# Patient Record
Sex: Female | Born: 1935 | Race: White | Hispanic: No | Smoking: Former smoker
Health system: Southern US, Community
[De-identification: ages and names within clinical notes are randomized; demographics above are authoritative.]

## PROBLEM LIST (undated history)

## (undated) DIAGNOSIS — I10 Essential (primary) hypertension: Secondary | ICD-10-CM

## (undated) DIAGNOSIS — G4700 Insomnia, unspecified: Secondary | ICD-10-CM

## (undated) DIAGNOSIS — I739 Peripheral vascular disease, unspecified: Secondary | ICD-10-CM

## (undated) DIAGNOSIS — E871 Hypo-osmolality and hyponatremia: Secondary | ICD-10-CM

## (undated) DIAGNOSIS — Z923 Personal history of irradiation: Secondary | ICD-10-CM

## (undated) DIAGNOSIS — E063 Autoimmune thyroiditis: Secondary | ICD-10-CM

## (undated) DIAGNOSIS — R350 Frequency of micturition: Secondary | ICD-10-CM

## (undated) DIAGNOSIS — I2781 Cor pulmonale (chronic): Secondary | ICD-10-CM

## (undated) DIAGNOSIS — J309 Allergic rhinitis, unspecified: Secondary | ICD-10-CM

## (undated) DIAGNOSIS — J449 Chronic obstructive pulmonary disease, unspecified: Secondary | ICD-10-CM

## (undated) DIAGNOSIS — R918 Other nonspecific abnormal finding of lung field: Secondary | ICD-10-CM

## (undated) DIAGNOSIS — L2089 Other atopic dermatitis: Secondary | ICD-10-CM

## (undated) DIAGNOSIS — R259 Unspecified abnormal involuntary movements: Secondary | ICD-10-CM

## (undated) DIAGNOSIS — F329 Major depressive disorder, single episode, unspecified: Secondary | ICD-10-CM

## (undated) DIAGNOSIS — E119 Type 2 diabetes mellitus without complications: Secondary | ICD-10-CM

## (undated) DIAGNOSIS — F32A Depression, unspecified: Secondary | ICD-10-CM

## (undated) DIAGNOSIS — E785 Hyperlipidemia, unspecified: Secondary | ICD-10-CM

## (undated) HISTORY — DX: Cor pulmonale (chronic): I27.81

## (undated) HISTORY — DX: Other nonspecific abnormal finding of lung field: R91.8

## (undated) HISTORY — DX: Depression, unspecified: F32.A

## (undated) HISTORY — PX: ROTATOR CUFF REPAIR: SHX139

## (undated) HISTORY — DX: Autoimmune thyroiditis: E06.3

## (undated) HISTORY — DX: Allergic rhinitis, unspecified: J30.9

## (undated) HISTORY — DX: Unspecified abnormal involuntary movements: R25.9

## (undated) HISTORY — DX: Chronic obstructive pulmonary disease, unspecified: J44.9

## (undated) HISTORY — PX: OTHER SURGICAL HISTORY: SHX169

## (undated) HISTORY — DX: Other atopic dermatitis: L20.89

## (undated) HISTORY — DX: Hypo-osmolality and hyponatremia: E87.1

## (undated) HISTORY — DX: Peripheral vascular disease, unspecified: I73.9

## (undated) HISTORY — DX: Major depressive disorder, single episode, unspecified: F32.9

## (undated) HISTORY — DX: Type 2 diabetes mellitus without complications: E11.9

## (undated) HISTORY — DX: Hyperlipidemia, unspecified: E78.5

## (undated) HISTORY — DX: Essential (primary) hypertension: I10

## (undated) HISTORY — PX: TOTAL ABDOMINAL HYSTERECTOMY: SHX209

## (undated) HISTORY — DX: Personal history of irradiation: Z92.3

## (undated) HISTORY — DX: Frequency of micturition: R35.0

## (undated) HISTORY — PX: EYE SURGERY: SHX253

## (undated) HISTORY — DX: Insomnia, unspecified: G47.00

---
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
## 1999-05-29 ENCOUNTER — Encounter: Payer: Self-pay | Admitting: Emergency Medicine

## 1999-05-29 ENCOUNTER — Ambulatory Visit (HOSPITAL_COMMUNITY): Admission: RE | Admit: 1999-05-29 | Discharge: 1999-05-29 | Payer: Self-pay | Admitting: Emergency Medicine

## 1999-10-16 ENCOUNTER — Encounter: Admission: RE | Admit: 1999-10-16 | Discharge: 1999-10-16 | Payer: Self-pay | Admitting: General Surgery

## 1999-10-16 ENCOUNTER — Encounter (HOSPITAL_BASED_OUTPATIENT_CLINIC_OR_DEPARTMENT_OTHER): Payer: Self-pay | Admitting: General Surgery

## 1999-10-18 ENCOUNTER — Ambulatory Visit (HOSPITAL_BASED_OUTPATIENT_CLINIC_OR_DEPARTMENT_OTHER): Admission: RE | Admit: 1999-10-18 | Discharge: 1999-10-18 | Payer: Self-pay | Admitting: General Surgery

## 2000-03-03 ENCOUNTER — Encounter: Admission: RE | Admit: 2000-03-03 | Discharge: 2000-03-03 | Payer: Self-pay | Admitting: Emergency Medicine

## 2000-03-03 ENCOUNTER — Encounter: Payer: Self-pay | Admitting: Emergency Medicine

## 2000-06-19 ENCOUNTER — Ambulatory Visit (HOSPITAL_COMMUNITY): Admission: RE | Admit: 2000-06-19 | Discharge: 2000-06-19 | Payer: Self-pay | Admitting: Emergency Medicine

## 2000-08-13 ENCOUNTER — Encounter (HOSPITAL_COMMUNITY): Admission: RE | Admit: 2000-08-13 | Discharge: 2000-11-11 | Payer: Self-pay | Admitting: Internal Medicine

## 2000-11-12 ENCOUNTER — Encounter (HOSPITAL_COMMUNITY): Admission: RE | Admit: 2000-11-12 | Discharge: 2001-01-28 | Payer: Self-pay | Admitting: Internal Medicine

## 2001-03-04 ENCOUNTER — Encounter: Admission: RE | Admit: 2001-03-04 | Discharge: 2001-03-04 | Payer: Self-pay | Admitting: Emergency Medicine

## 2001-03-04 ENCOUNTER — Encounter: Payer: Self-pay | Admitting: Emergency Medicine

## 2001-04-26 ENCOUNTER — Ambulatory Visit (HOSPITAL_COMMUNITY): Admission: RE | Admit: 2001-04-26 | Discharge: 2001-04-26 | Payer: Self-pay | Admitting: Gastroenterology

## 2001-08-26 ENCOUNTER — Inpatient Hospital Stay (HOSPITAL_COMMUNITY): Admission: EM | Admit: 2001-08-26 | Discharge: 2001-08-30 | Payer: Self-pay | Admitting: Emergency Medicine

## 2001-11-09 ENCOUNTER — Encounter: Admission: RE | Admit: 2001-11-09 | Discharge: 2001-11-09 | Payer: Self-pay | Admitting: Emergency Medicine

## 2001-11-09 ENCOUNTER — Encounter: Payer: Self-pay | Admitting: Emergency Medicine

## 2002-02-28 ENCOUNTER — Encounter: Admission: RE | Admit: 2002-02-28 | Discharge: 2002-02-28 | Payer: Self-pay | Admitting: Emergency Medicine

## 2002-02-28 ENCOUNTER — Encounter: Payer: Self-pay | Admitting: Emergency Medicine

## 2002-03-11 ENCOUNTER — Encounter: Admission: RE | Admit: 2002-03-11 | Discharge: 2002-03-11 | Payer: Self-pay | Admitting: Orthopedic Surgery

## 2002-03-11 ENCOUNTER — Encounter: Payer: Self-pay | Admitting: Orthopedic Surgery

## 2002-03-14 ENCOUNTER — Ambulatory Visit (HOSPITAL_BASED_OUTPATIENT_CLINIC_OR_DEPARTMENT_OTHER): Admission: RE | Admit: 2002-03-14 | Discharge: 2002-03-14 | Payer: Self-pay | Admitting: Orthopedic Surgery

## 2002-12-29 LAB — HM COLONOSCOPY

## 2003-03-06 ENCOUNTER — Encounter: Payer: Self-pay | Admitting: Emergency Medicine

## 2003-03-06 ENCOUNTER — Encounter: Admission: RE | Admit: 2003-03-06 | Discharge: 2003-03-06 | Payer: Self-pay | Admitting: Emergency Medicine

## 2003-08-01 ENCOUNTER — Encounter: Admission: RE | Admit: 2003-08-01 | Discharge: 2003-08-01 | Payer: Self-pay | Admitting: Emergency Medicine

## 2003-08-01 ENCOUNTER — Encounter: Payer: Self-pay | Admitting: Emergency Medicine

## 2003-09-20 ENCOUNTER — Encounter: Payer: Self-pay | Admitting: Emergency Medicine

## 2003-09-20 ENCOUNTER — Encounter: Admission: RE | Admit: 2003-09-20 | Discharge: 2003-09-20 | Payer: Self-pay | Admitting: Emergency Medicine

## 2003-09-26 ENCOUNTER — Encounter: Payer: Self-pay | Admitting: Emergency Medicine

## 2003-09-26 ENCOUNTER — Encounter: Admission: RE | Admit: 2003-09-26 | Discharge: 2003-09-26 | Payer: Self-pay | Admitting: Emergency Medicine

## 2003-10-10 ENCOUNTER — Ambulatory Visit (HOSPITAL_COMMUNITY): Admission: RE | Admit: 2003-10-10 | Discharge: 2003-10-10 | Payer: Self-pay | Admitting: Emergency Medicine

## 2003-10-10 ENCOUNTER — Encounter: Payer: Self-pay | Admitting: Emergency Medicine

## 2004-03-04 ENCOUNTER — Encounter: Admission: RE | Admit: 2004-03-04 | Discharge: 2004-03-04 | Payer: Self-pay | Admitting: Emergency Medicine

## 2004-03-15 ENCOUNTER — Encounter: Admission: RE | Admit: 2004-03-15 | Discharge: 2004-03-15 | Payer: Self-pay | Admitting: Emergency Medicine

## 2005-03-21 ENCOUNTER — Encounter: Admission: RE | Admit: 2005-03-21 | Discharge: 2005-03-21 | Payer: Self-pay | Admitting: Emergency Medicine

## 2005-03-31 ENCOUNTER — Ambulatory Visit (HOSPITAL_COMMUNITY): Admission: RE | Admit: 2005-03-31 | Discharge: 2005-03-31 | Payer: Self-pay | Admitting: Emergency Medicine

## 2005-05-15 ENCOUNTER — Ambulatory Visit: Payer: Self-pay | Admitting: Internal Medicine

## 2005-09-08 ENCOUNTER — Ambulatory Visit: Payer: Self-pay | Admitting: Internal Medicine

## 2006-03-09 ENCOUNTER — Ambulatory Visit: Payer: Self-pay | Admitting: Internal Medicine

## 2006-03-23 ENCOUNTER — Encounter: Admission: RE | Admit: 2006-03-23 | Discharge: 2006-03-23 | Payer: Self-pay | Admitting: Emergency Medicine

## 2006-12-28 ENCOUNTER — Encounter: Admission: RE | Admit: 2006-12-28 | Discharge: 2006-12-28 | Payer: Self-pay | Admitting: *Deleted

## 2007-03-09 ENCOUNTER — Ambulatory Visit: Payer: Self-pay | Admitting: Internal Medicine

## 2007-03-25 ENCOUNTER — Encounter: Admission: RE | Admit: 2007-03-25 | Discharge: 2007-03-25 | Payer: Self-pay | Admitting: *Deleted

## 2008-01-04 ENCOUNTER — Ambulatory Visit: Payer: Self-pay | Admitting: Vascular Surgery

## 2008-02-01 ENCOUNTER — Telehealth (INDEPENDENT_AMBULATORY_CARE_PROVIDER_SITE_OTHER): Payer: Self-pay | Admitting: *Deleted

## 2008-02-02 DIAGNOSIS — J449 Chronic obstructive pulmonary disease, unspecified: Secondary | ICD-10-CM

## 2008-02-02 DIAGNOSIS — J4489 Other specified chronic obstructive pulmonary disease: Secondary | ICD-10-CM | POA: Insufficient documentation

## 2008-02-02 DIAGNOSIS — I10 Essential (primary) hypertension: Secondary | ICD-10-CM | POA: Insufficient documentation

## 2008-03-14 ENCOUNTER — Ambulatory Visit: Payer: Self-pay | Admitting: Internal Medicine

## 2008-03-14 DIAGNOSIS — J45909 Unspecified asthma, uncomplicated: Secondary | ICD-10-CM | POA: Insufficient documentation

## 2008-03-14 DIAGNOSIS — J309 Allergic rhinitis, unspecified: Secondary | ICD-10-CM | POA: Insufficient documentation

## 2008-03-14 DIAGNOSIS — J439 Emphysema, unspecified: Secondary | ICD-10-CM

## 2008-03-27 ENCOUNTER — Encounter: Admission: RE | Admit: 2008-03-27 | Discharge: 2008-03-27 | Payer: Self-pay | Admitting: *Deleted

## 2008-04-20 ENCOUNTER — Telehealth: Payer: Self-pay | Admitting: Internal Medicine

## 2008-05-09 ENCOUNTER — Encounter: Payer: Self-pay | Admitting: Internal Medicine

## 2008-09-22 ENCOUNTER — Telehealth (INDEPENDENT_AMBULATORY_CARE_PROVIDER_SITE_OTHER): Payer: Self-pay | Admitting: *Deleted

## 2009-03-14 ENCOUNTER — Ambulatory Visit: Payer: Self-pay | Admitting: Internal Medicine

## 2009-03-28 ENCOUNTER — Encounter: Admission: RE | Admit: 2009-03-28 | Discharge: 2009-03-28 | Payer: Self-pay | Admitting: *Deleted

## 2009-04-10 ENCOUNTER — Telehealth (INDEPENDENT_AMBULATORY_CARE_PROVIDER_SITE_OTHER): Payer: Self-pay | Admitting: *Deleted

## 2009-04-10 ENCOUNTER — Encounter: Payer: Self-pay | Admitting: Internal Medicine

## 2009-04-18 ENCOUNTER — Encounter: Payer: Self-pay | Admitting: Internal Medicine

## 2009-04-24 ENCOUNTER — Encounter: Admission: RE | Admit: 2009-04-24 | Discharge: 2009-04-24 | Payer: Self-pay | Admitting: *Deleted

## 2009-04-24 LAB — HM DEXA SCAN

## 2009-05-22 ENCOUNTER — Encounter: Admission: RE | Admit: 2009-05-22 | Discharge: 2009-05-22 | Payer: Self-pay | Admitting: *Deleted

## 2009-06-04 ENCOUNTER — Encounter: Admission: RE | Admit: 2009-06-04 | Discharge: 2009-06-04 | Payer: Self-pay | Admitting: *Deleted

## 2009-07-18 ENCOUNTER — Encounter: Payer: Self-pay | Admitting: Internal Medicine

## 2009-08-01 ENCOUNTER — Telehealth: Payer: Self-pay | Admitting: Internal Medicine

## 2009-10-24 ENCOUNTER — Telehealth: Payer: Self-pay | Admitting: Internal Medicine

## 2009-10-25 ENCOUNTER — Encounter: Payer: Self-pay | Admitting: Internal Medicine

## 2009-11-06 ENCOUNTER — Telehealth (INDEPENDENT_AMBULATORY_CARE_PROVIDER_SITE_OTHER): Payer: Self-pay | Admitting: *Deleted

## 2009-11-07 ENCOUNTER — Encounter: Payer: Self-pay | Admitting: Internal Medicine

## 2010-03-13 ENCOUNTER — Ambulatory Visit: Payer: Self-pay | Admitting: Internal Medicine

## 2010-03-29 ENCOUNTER — Encounter: Admission: RE | Admit: 2010-03-29 | Discharge: 2010-03-29 | Payer: Self-pay | Admitting: Internal Medicine

## 2010-04-11 ENCOUNTER — Encounter: Admission: RE | Admit: 2010-04-11 | Discharge: 2010-04-11 | Payer: Self-pay | Admitting: Internal Medicine

## 2010-05-09 ENCOUNTER — Ambulatory Visit: Payer: Self-pay | Admitting: Vascular Surgery

## 2010-05-14 ENCOUNTER — Encounter: Payer: Self-pay | Admitting: Internal Medicine

## 2010-05-17 ENCOUNTER — Encounter: Payer: Self-pay | Admitting: Internal Medicine

## 2010-05-23 ENCOUNTER — Telehealth: Payer: Self-pay | Admitting: Internal Medicine

## 2010-06-10 ENCOUNTER — Encounter: Admission: RE | Admit: 2010-06-10 | Discharge: 2010-06-10 | Payer: Self-pay | Admitting: Internal Medicine

## 2010-07-02 ENCOUNTER — Encounter: Payer: Self-pay | Admitting: Internal Medicine

## 2010-08-23 ENCOUNTER — Encounter: Payer: Self-pay | Admitting: Internal Medicine

## 2010-10-23 ENCOUNTER — Encounter: Payer: Self-pay | Admitting: Internal Medicine

## 2010-11-05 ENCOUNTER — Inpatient Hospital Stay (HOSPITAL_COMMUNITY): Admission: EM | Admit: 2010-11-05 | Discharge: 2010-11-07 | Payer: Self-pay | Admitting: Emergency Medicine

## 2010-11-14 ENCOUNTER — Telehealth: Payer: Self-pay | Admitting: Internal Medicine

## 2010-11-18 ENCOUNTER — Encounter
Admission: RE | Admit: 2010-11-18 | Discharge: 2011-01-28 | Payer: Self-pay | Source: Home / Self Care | Attending: Internal Medicine | Admitting: Internal Medicine

## 2010-11-26 ENCOUNTER — Ambulatory Visit (HOSPITAL_COMMUNITY)
Admission: RE | Admit: 2010-11-26 | Discharge: 2010-11-26 | Payer: Self-pay | Source: Home / Self Care | Admitting: Internal Medicine

## 2010-11-29 ENCOUNTER — Ambulatory Visit: Payer: Self-pay | Admitting: Internal Medicine

## 2010-11-29 DIAGNOSIS — E119 Type 2 diabetes mellitus without complications: Secondary | ICD-10-CM

## 2010-12-06 ENCOUNTER — Ambulatory Visit: Payer: Self-pay | Admitting: Internal Medicine

## 2010-12-06 ENCOUNTER — Encounter: Payer: Self-pay | Admitting: Internal Medicine

## 2010-12-06 DIAGNOSIS — R0602 Shortness of breath: Secondary | ICD-10-CM | POA: Insufficient documentation

## 2010-12-12 ENCOUNTER — Encounter: Payer: Self-pay | Admitting: Internal Medicine

## 2010-12-20 ENCOUNTER — Ambulatory Visit: Payer: Self-pay | Admitting: Internal Medicine

## 2010-12-24 ENCOUNTER — Encounter: Payer: Self-pay | Admitting: Internal Medicine

## 2010-12-31 ENCOUNTER — Telehealth (INDEPENDENT_AMBULATORY_CARE_PROVIDER_SITE_OTHER): Payer: Self-pay | Admitting: *Deleted

## 2011-01-28 NOTE — Medication Information (Signed)
Summary: P [facility] P/[employer] [facility]  P [facility] P[facility]   Imported By: Lester [place] 07/26/2010 08:59:06  _____________________________________________________________________  External Attachment:    Type:   Image     Comment:   External Document

## 2011-01-28 NOTE — Medication Information (Signed)
Summary: [employer] Medication/[facility]  [facility] Med[facility]   Imported By: Sherian Rein 06/03/2010 10:55:57  _____________________________________________________________________  External Attachment:    Type:   Image     Comment:   External Document

## 2011-01-28 NOTE — Progress Notes (Signed)
Summary: sob with exertion  Phone Note Call from Patient Call back at Home Phone [phone]   Caller: Patient Call For: young Summary of Call: Pt states she was d/c from [facility] on Thurs re: diabetes, says it has affected her O2 level, pls advise. Initial call taken by: Darletta Moll,  November 14, 2010 9:28 AM  Follow-up for Phone Call        Pt calling to let Dr. Maple Hudson know that she was admitted to the hospital last week and was diagnosed with diabetes. Pt states this is under control now.She also wants him to be aware that since discharge from hospital when she is doing her Physical Therapy her sats at rest on room air have been 93-97% but when she walked 120-130 feet her sats dropped to 80%. SHe staets she rested and sats incresed to 93 % again without oxygen. Pt wanted to know Dr. Maple Hudson thoughts on this. Please advise.  Carron Curie CMA  November 14, 2010 9:55 AM   Additional Follow-up for Phone Call Additional follow up Details #1::        Per CD[facility]- offer next open routine appt.Reynaldo Minium CMA  November 14, 2010 11:44 AM   pt set to see CY on 12-20-10 at 2:15.Carron Curie CMA  November 14, 2010 12:04 PM

## 2011-01-28 NOTE — Medication Information (Signed)
Summary: Qualifies/[facility] [facility]  Qualifies/[employer] [facility]   Imported By: Lester Berry 09/10/2010 07:18:45  _____________________________________________________________________  External Attachment:    Type:   Image     Comment:   External Document

## 2011-01-28 NOTE — Progress Notes (Signed)
Summary: papers  Phone Note Call from Patient   Caller: Patient Call For: young Summary of Call: pt dropped off papers for [facility] re: allergy/ med hx. Florentina Addison has received these.  Initial call taken by: Tivis Ringer, CNA,  May 23, 2010 4:09 PM  Follow-up for Phone Call        Pt needs RX for assistance program and we will faxe off RX and paperwork. Pt aware.Reynaldo Minium CMA  May 23, 2010 4:18 PM     Papers have been faxed with RX to [phone] per pt and paperwork sent down to scan in EMR.Reynaldo Minium CMA  May 23, 2010 4:56 PM     Prescriptions: ADVAIR DISKUS 250-50 MCG/DOSE  MISC (FLUTICASONE-SALMETEROL) 1 puff two times a day  #3 x 3   Entered by:   Reynaldo Minium CMA   Authorized by:   Waymon Budge MD   Signed by:   Reynaldo Minium CMA on 05/23/2010   Method used:   Print then Give to Patient   RxID:   [id number]

## 2011-01-28 NOTE — Assessment & Plan Note (Signed)
Summary: f/u 1 yr///kp   Primary Provider/Referring Provider:  [facility]  CC:  yearly follow up visit.  History of Present Illness:  03/14/08  One episode opf bronchitis this winter. Z- Pak worked well. Hoarseness since the Fall without postnasal drip. does note reflux. Very occasionally chokes while swallowing. No waking choked. No throat pain or food hang-up. Wheezed yesterday-first use of albuterol in a long time. Dopplers indicated peripheral arterial disease- told to walk.  03/14/09- Asthma, COPD Has had good year without significant problem. Did get seasonal flu vaccine. Notices a little hoarseness recently she relates to early Spring pollen. Occasional wheeze responds appropriately to rescue inhaler. She denies nasal congestion or drainage, headache, sore throat, chest tightness or wheeze, chest pain or palpitation  March 13, 2010- Asthma, COPD........................Marland Kitchenhusband here Had flu vax this winter with no significant problems recalled otherwise over the past year.Used Ventolin rescue inhaler some in the Fall when she says she had some "hayfever". Denies routine cough, wheeze, sinus drip, chest pain or palpitation. She went to Dr Leanord Hawking once for ? bronchitis.     Current Medications (verified): 1)  Spiriva Handihaler 18 Mcg  Caps (Tiotropium Bromide Monohydrate) .... Once Daily 2)  Advair Diskus 250-50 Mcg/dose  Misc (Fluticasone-Salmeterol) .Marland Kitchen.. 1 Puff Two Times A Day 3)  Ventolin Hfa 108 (90 Base) Mcg/act  Aers (Albuterol Sulfate) .... 2 Puffs Four Times A Day Prn 4)  Ultram 50 Mg  Tabs (Tramadol Hcl) .... 2 By Mouth At Bedtime 5)  Toprol Xl 50 Mg  Tb24 (Metoprolol Succinate) .Marland Kitchen.. 1 Once Daily 6)  Multivitamins   Tabs (Multiple Vitamin) .... Once Daily 7)  Lasix 40 Mg  Tabs (Furosemide) .... Once Daily 8)  Clonazepam 0.5 Mg  Tabs (Clonazepam) .... Three Times A Day 9)  Hydroxyzine Hcl 25 Mg  Tabs (Hydroxyzine Hcl) .... Three Times A Day As Needed 10)  Lovaza 1  Gm  Caps (Omega-3-Acid Ethyl Esters) .... Two Tablets Daily 11)  Evista 60 Mg  Tabs (Raloxifene Hcl) .... Take 1 Tablet By Mouth Once A Day 12)  Crestor 10 Mg  Tabs (Rosuvastatin Calcium) .... Take 1 Tablet By Mouth Once A Day 13)  Plavix 75 Mg  Tabs (Clopidogrel Bisulfate) .... Take 1 Tablet By Mouth Once A Day 14)  Omeprazole 20 Mg  Cpdr (Omeprazole) .... Take 1 Tab By Mouth At Bedtime As Needed  Allergies (verified): 1)  ! * Lyrica 2)  Codeine 3)  Inderal 4)  Vioxx 5)  Celebrex 6)  * Metronizadol  Past History:  Past Medical History: Last updated: 03/14/2009 Allergic Rhinitis Asthma C O P D Hypertension cor pulmonale thyroidiitis- Hashimoto's peripheral neuropathy Osteoporosis Macular telangiectasia  Past Surgical History: Last updated: 03/14/2009 Breast lump- benign Breast cysts Varicose veins Total Abdominal Hysterectomy- fibroid Right shoulder arthroscopy- rotator cuff cataracts  Social History: Last updated: 03/14/2009 Patient states former smoker.  Married  Risk Factors: Smoking Status: quit (03/14/2008)  Review of Systems      See HPI  The patient denies anorexia, fever, weight loss, weight gain, vision loss, decreased hearing, hoarseness, chest pain, syncope, dyspnea on exertion, peripheral edema, prolonged cough, headaches, hemoptysis, and severe indigestion/heartburn.    Vital Signs:  Patient profile:   77 year old female Height:      63 inches Weight:      156.38 pounds BMI:     27.80 O2 Sat:      91 % on Room air Pulse rate:   74 / minute  BP sitting:   124 / 78  (left arm) Cuff size:   regular  Vitals Entered By: Reynaldo Minium CMA (March 13, 2010 11:20 AM)  O2 Flow:  Room air  Physical Exam  Additional Exam:  General: A/Ox3; pleasant and cooperative, NAD, SKIN: no rash, lesions NODES: no lymphadenopathy HEENT: [place]/AT, EOM- WNL, Conjuctivae- clear, PERRLA, TM-WNL, Nose- clear, Throat- clear and wnl. Mallampatii II, minimal hoarseness  without stridor. Throat looks. NECK: Supple w/ fair ROM, JVD- none, normal carotid impulses w/o bruits Thyroid- normal to palpation CHEST: Clear to P&A HEART: RRR, no m/g/r heard ABDOMEN: UYQ:IHKV, nl pulses, no edema  NEURO: Grossly intact to observation      Impression & Recommendations:  Problem # 1:  ASTHMA (ICD-493.90) Doing very well without acute infection recently She is compliant with Advair so she rarely needs the rescue inhaler. We reviewed these meds.  Problem # 2:  ALLERGIC RHINITIS (ICD-477.9) Anticipating seasonal pollen issues whichwe will address if antihistamines are insufficient later.  Medications Added to Medication List This Visit: 1)  Ultram 50 Mg Tabs (Tramadol hcl) .... 2 by mouth at bedtime 2)  Toprol Xl 50 Mg Tb24 (Metoprolol succinate) .Marland Kitchen.. 1 once daily  Other Orders: Est. Patient Level III (42595)  Patient Instructions: 1)  Please schedule a follow-up appointment in 1 year. 2)  Call for refills when needed

## 2011-01-28 NOTE — Medication Information (Signed)
Summary: Qualified/[employer]  Qualified/[employer]   Imported By: Sherian Rein 11/11/2010 09:31:32  _____________________________________________________________________  External Attachment:    Type:   Image     Comment:   External Document

## 2011-01-28 NOTE — Assessment & Plan Note (Signed)
Summary: PER DAUGHTER CALL/CB   Primary Provider/Referring Provider:  [facility]  CC:  sob with exertion pt was [facility] for 3 days  nov 8-10.  pt denies cough.  found out diabetic.  History of Present Illness:  03/14/09- Asthma, COPD Has had good year without significant problem. Did get seasonal flu vaccine. Notices a little hoarseness recently she relates to early Spring pollen. Occasional wheeze responds appropriately to rescue inhaler. She denies nasal congestion or drainage, headache, sore throat, chest tightness or wheeze, chest pain or palpitation  March 13, 2010- Asthma, COPD........................Marland Kitchenhusband here Had flu vax this winter with no significant problems recalled otherwise over the past year.Used Ventolin rescue inhaler some in the Fall when she says she had some "hayfever". Denies routine cough, wheeze, sinus drip, chest pain or palpitation. She went to Dr Leanord Hawking once for ? bronchitis.  November 29, 2010-  Asthma, COPD........................Marland Kitchenhusband here Nurse-CC: sob with exertion pt was [facility] for 3 days  nov 8-10.  pt denies cough.  found out diabetic Hosp with renal insuff and HONK, new dx DM 1. Since about 2 weeks before hospital she has felt more short of breath, needing to stop and pace herself while walking. Denies cough, wheeze, chest pain. Occasional palpitation. On lasix no edema. Legs did feel sore and she was sent to Dr Durwin Nora for arterial evaluation.       Preventive Screening-Counseling & Management  Alcohol-Tobacco     Smoking Status: quit > 6 months     Year Quit: 2000  Current Medications (verified): 1)  Spiriva Handihaler 18 Mcg  Caps (Tiotropium Bromide Monohydrate) .... Once Daily 2)  Advair Diskus 250-50 Mcg/dose  Misc (Fluticasone-Salmeterol) .Marland Kitchen.. 1 Puff Two Times A Day 3)  Ventolin Hfa 108 (90 Base) Mcg/act  Aers (Albuterol Sulfate) .... 2 Puffs Four Times A Day Prn 4)  Ultram 50 Mg  Tabs (Tramadol Hcl) .... 2 By Mouth At Bedtime 5)   Toprol Xl 50 Mg  Tb24 (Metoprolol Succinate) .Marland Kitchen.. 1 Once Daily 6)  Multivitamins   Tabs (Multiple Vitamin) .... Once Daily 7)  Lasix 40 Mg  Tabs (Furosemide) .... Once Daily 8)  Clonazepam 0.5 Mg  Tabs (Clonazepam) .... Three Times A Day 9)  Hydroxyzine Hcl 25 Mg  Tabs (Hydroxyzine Hcl) .... Three Times A Day As Needed 10)  Lovaza 1 Gm  Caps (Omega-3-Acid Ethyl Esters) .... Two Tablets Daily 11)  Evista 60 Mg  Tabs (Raloxifene Hcl) .... Take 1 Tablet By Mouth Once A Day 12)  Crestor 10 Mg  Tabs (Rosuvastatin Calcium) .... Take 1 Tablet By Mouth Once A Day 13)  Plavix 75 Mg  Tabs (Clopidogrel Bisulfate) .... Take 1 Tablet By Mouth Once A Day 14)  Omeprazole 20 Mg  Cpdr (Omeprazole) .... Take 1 Tab By Mouth At Bedtime As Needed 15)  Lantus 100 Unit/ml Soln (Insulin Glargine) .Marland Kitchen.. 10 Unbits At Bedtime 16)  Novolog 100 Unit/ml Soln (Insulin Aspart) .... 5 Units Three Times A Day At Meals  Allergies: 1)  ! * Lyrica 2)  Codeine 3)  Inderal 4)  Vioxx 5)  Celebrex 6)  * Metronizadol  Past History:  Past Surgical History: Last updated: 03/14/2009 Breast lump- benign Breast cysts Varicose veins Total Abdominal Hysterectomy- fibroid Right shoulder arthroscopy- rotator cuff cataracts  Social History: Last updated: 03/14/2009 Patient states former smoker.  Married  Risk Factors: Smoking Status: quit > 6 months (11/29/2010)  Past Medical History: Allergic Rhinitis Asthma C O P D Hypertension cor pulmonale  thyroidiitis- Hashimoto's peripheral neuropathy Osteoporosis Macular telangiectasia Diabetes, Type 2  Social History: Smoking Status:  quit > 6 months  Review of Systems      See HPI       The patient complains of shortness of breath with activity.  The patient denies shortness of breath at rest, productive cough, non-productive cough, coughing up blood, chest pain, irregular heartbeats, acid heartburn, indigestion, loss of appetite, weight change, abdominal pain,  difficulty swallowing, sore throat, tooth/dental problems, headaches, nasal congestion/difficulty breathing through nose, sneezing, itching, ear ache, rash, change in color of mucus, and fever.    Vital Signs:  Patient profile:   77 year old female Height:      63 inches Weight:      151 pounds BMI:     26.85 O2 Sat:      95 % Pulse rate:   90 / minute BP sitting:   90 / 60  (left arm) Cuff size:   regular  Vitals Entered By: Kandice Hams CMA (November 29, 2010 4:35 PM) CC: sob with exertion pt was [facility] for 3 days  nov 8-10.  pt denies cough.  found out diabetic Comments pharmacy verfied  Note* pt walked in sats were 79%on ra HR 79  after resting 02 went to 95% HR 90 meds verfied flu shot 0ct 2011   Physical Exam  Additional Exam:  General: A/Ox3; pleasant and cooperative, NAD, SKIN: no rash, lesions NODES: no lymphadenopathy HEENT: [place]/AT, EOM- WNL, Conjuctivae- clear, PERRLA, TM-WNL, Nose- clear, Throat- clear and wnl. Mallampatii II, minimal hoarseness without stridor. NECK: Supple w/ fair ROM, JVD- none, normal carotid impulses w/o bruits Thyroid- normal to palpation CHEST: Clear to P&A, no wheeze, rhonchi  HEART: RRR, no m/g/r heard ABDOMEN: trim [id number], nl pulses, no edema , negative Homan's NEURO: Grossly intact to observation      Impression & Recommendations:  Problem # 1:  C O P D (ICD-496) She feels she is getting more short of breath w/ exertion not explained by bronchospsm, fluid overload or mucslce weakness. i will get D-dimer and order PFTs  Problem # 2:  ALLERGIC RHINITIS (ICD-477.9) Nasal symptoms have not increased in parallel with her dyspnea and she is breathing comfortably through her nose.  Medications Added to Medication List This Visit: 1)  Lantus 100 Unit/ml Soln (Insulin glargine) .Marland Kitchen.. 10 unbits at bedtime 2)  Novolog 100 Unit/ml Soln (Insulin aspart) .... 5 units three times a day at meals  Other Orders: Est. Patient Level III  (91478) T-D-Dimer Fibrin Derivatives Quantitive ([phone]) Misc. Referral (Misc. Ref)  Patient Instructions: 1)  Please schedule a follow-up appointment in 1 month. 2)  Lab 3)  See [facility] tio schedule PFT and 6 MWT

## 2011-01-28 NOTE — Medication Information (Signed)
Summary: [facility]/[employer]  Spiriva/[employer]   Imported By: Sherian Rein 05/24/2010 14:23:14  _____________________________________________________________________  External Attachment:    Type:   Image     Comment:   External Document

## 2011-01-30 NOTE — Miscellaneous (Signed)
Summary: Orders Update pft charges  Clinical Lists Changes  Orders: Added new Service order of Carbon Monoxide diffusing w/capacity (94720) - Signed Added new Service order of Lung Volumes (94240) - Signed Added new Service order of Spirometry (Pre & Post) (94060) - Signed 

## 2011-01-30 NOTE — Assessment & Plan Note (Signed)
Summary: sob with exertion//jrc   Primary Provider/Referring Provider:  [facility]  CC:  Follow up.  Breathing better since last OV - relates this to having o2 now.  nonprod cough at night.  Denies wheezing and chest tightness.  History of Present Illness: March 13, 2010- Asthma, COPD........................Jennifer Kitchenhusband here Had flu vax this winter with no significant problems recalled otherwise over the past year.Used Ventolin rescue inhaler some in the Fall when she says she had some "hayfever". Denies routine cough, wheeze, sinus drip, chest pain or palpitation. She went to Dr Leanord Hawking once for ? bronchitis.  November 29, 2010-  Asthma, COPD........................Jennifer Kitchenhusband here Nurse-CC: sob with exertion pt was [facility] for 3 days  nov 8-10.  pt denies cough.  found out diabetic Hosp with renal insuff and HONK, new dx DM 1. Since about 2 weeks before hospital she has felt more short of breath, needing to stop and pace herself while walking. Denies cough, wheeze, chest pain. Occasional palpitation. On lasix no edema. Legs did feel sore and she was sent to Dr Durwin Nora for arterial evaluation.  December 20, 2010  Asthma, COPD........................Jennifer Kitchenhusband here Nurse-CC: Follow up.  Breathing better since last OV - relates this to having o2 now.  nonprod cough at night.  Denies wheezing and chest tightness We had sustained discussion about her portable O2 options. She feels tired, notes some cough but nothing acute, not sick today.  11/29/10- D-dimer WNL 0.27 12/06/10- PFT-office spiro- FVC 2.0/.78; FEV1 0.9/ .50; R 0.48 12/06/10- - 96%, 81%-stopped for dyspnea at 2.5 minutes w/ sat 81%, given O2 raising sat to 92% on 2L at rest.     Preventive Screening-Counseling & Management  Alcohol-Tobacco     Smoking Status: quit > 6 months     Year Quit: 2000     Pack years: 53 years 2 packs daily  Current Medications (verified): 1)  Spiriva Handihaler 18 Mcg  Caps (Tiotropium Bromide  Monohydrate) .... Once Daily 2)  Advair Diskus 250-50 Mcg/dose  Misc (Fluticasone-Salmeterol) .Jennifer Duran.. 1 Puff Two Times A Day 3)  Ventolin Hfa 108 (90 Base) Mcg/act  Aers (Albuterol Sulfate) .... 2 Puffs Four Times A Day Prn 4)  Ultram 50 Mg  Tabs (Tramadol Hcl) .... 2 By Mouth At Bedtime 5)  Toprol Xl 50 Mg  Tb24 (Metoprolol Succinate) .Jennifer Duran.. 1 Once Daily 6)  Multivitamins   Tabs (Multiple Vitamin) .... Once Daily 7)  Lasix 40 Mg  Tabs (Furosemide) .... Once Daily 8)  Clonazepam 0.5 Mg  Tabs (Clonazepam) .... Three Times A Day 9)  Hydroxyzine Hcl 25 Mg  Tabs (Hydroxyzine Hcl) .... Three Times A Day As Needed 10)  Lovaza 1 Gm  Caps (Omega-3-Acid Ethyl Esters) .... Two Tablets Daily 11)  Evista 60 Mg  Tabs (Raloxifene Hcl) .... Take 1 Tablet By Mouth Once A Day 12)  Crestor 10 Mg  Tabs (Rosuvastatin Calcium) .... Take 1 Tablet By Mouth Once A Day 13)  Plavix 75 Mg  Tabs (Clopidogrel Bisulfate) .... Take 1 Tablet By Mouth Once A Day 14)  Omeprazole 20 Mg  Cpdr (Omeprazole) .... Take 1 Tab By Mouth At Bedtime As Needed 15)  Lantus 100 Unit/ml Soln (Insulin Glargine) .Jennifer Duran.. 10 Unbits At Bedtime 16)  Novolog 100 Unit/ml Soln (Insulin Aspart) .... 5 Units Three Times A Day At Meals  Allergies (verified): 1)  ! * Lyrica 2)  Codeine 3)  Inderal 4)  Vioxx 5)  Celebrex 6)  * Metronizadol  Past History:  Past Surgical  History: Last updated: 03/14/2009 Breast lump- benign Breast cysts Varicose veins Total Abdominal Hysterectomy- fibroid Right shoulder arthroscopy- rotator cuff cataracts  Social History: Last updated: 03/14/2009 Patient states former smoker.  Married  Risk Factors: Smoking Status: quit > 6 months (12/20/2010)  Past Medical History: Allergic Rhinitis Asthma C O P D- [facility] PFT 12/06/10- FEV1 0.9/ .50, FEV1/FVC 0.48. Hypertension cor pulmonale thyroidiitis- Hashimoto's peripheral neuropathy Osteoporosis Macular telangiectasia Diabetes, Type 2  Review of Systems       See HPI       The patient complains of shortness of breath with activity and non-productive cough.  The patient denies shortness of breath at rest, coughing up blood, chest pain, irregular heartbeats, acid heartburn, indigestion, loss of appetite, weight change, abdominal pain, difficulty swallowing, sore throat, tooth/dental problems, headaches, nasal congestion/difficulty breathing through nose, sneezing, itching, anxiety, rash, change in color of mucus, and fever.    Vital Signs:  Patient profile:   77 year old female Height:      63 inches Weight:      147 pounds BMI:     26.13 O2 Sat:      94 % on 2 L/mincont Pulse rate:   97 / minute BP sitting:   114 / 64  (left arm) Cuff size:   regular  Vitals Entered By: Gweneth Dimitri RN (December 20, 2010 2:33 PM)  O2 Flow:  2 L/mincont CC: Follow up.  Breathing better since last OV - relates this to having o2 now.  nonprod cough at night.  Denies wheezing and chest tightness Comments Medications reviewed with patient Daytime contact number verified with patient. Gweneth Dimitri RN  December 20, 2010 2:34 PM    Physical Exam  Additional Exam:  General: A/Ox3; pleasant and cooperative, NAD, 94% on 2 L SKIN: no rash, lesions NODES: no lymphadenopathy HEENT: [place]/AT, EOM- WNL, Conjuctivae- clear, PERRLA, TM-WNL, Nose- clear, Throat- clear and wnl. Mallampatii II, minimal hoarseness without stridor. NECK: Supple w/ fair ROM, JVD- none, normal carotid impulses w/o bruits Thyroid- normal to palpation CHEST: Clear to P&A,distant. Deep breath started some wheezy cough.  HEART: RRR, no m/g/r heard ABDOMEN: trim QVZ:DGLO, nl pulses, no edema  NEURO: Grossly intact to observation      Impression & Recommendations:  Problem # 1:  ASTHMA, CHRONIC OBSTRUCTIVE WITHOUT STATUS ASTHMATICUS (ICD-493.20) No new process for which I can do more to help her today.Moderately sevee fixed obstructive disease, now becoming O2 dependnet.  She quit smoking  through pulmonary rehab years ago. Moderate obstruction now on spriometry.  Problem # 2:  ALLERGIC RHINITIS (ICD-477.9) Not disturbing in the current season.   Other Orders: Est. Patient Level III (75643)  Patient Instructions: 1)  Please schedule a follow-up appointment in 6 months. 2)  Samples Spiriva and Advair 250   Immunization History:  Influenza Immunization History:    Influenza:  historical (08/29/2010)  Pneumovax Immunization History:    Pneumovax:  historical (10/29/2010)

## 2011-01-30 NOTE — Medication Information (Signed)
Summary: Qualifies/[employer]  Qualifies/[employer]   Imported By: Lester [place] 01/17/2011 09:33:45  _____________________________________________________________________  External Attachment:    Type:   Image     Comment:   External Document

## 2011-01-30 NOTE — Progress Notes (Signed)
Summary: PA meds for pick up  Phone Note Outgoing Call   Call placed by: Reynaldo Minium CMA,  December 31, 2010 5:17 PM Call placed to: Patient Summary of Call: Pt aware that [facility] Patient Assistance program medication is in and at the front for pick up. Initial call taken by: Reynaldo Minium CMA,  December 31, 2010 5:18 PM

## 2011-01-30 NOTE — Assessment & Plan Note (Signed)
Summary: SIX MIN WALK-PULM STRESS TEST  Nurse Visit   Vital Signs:  Patient profile:   77 year old female Pulse rate:   89 / minute BP sitting:   94 / 64   Medications Prior to Update: 1)  Spiriva Handihaler 18 Mcg  Caps (Tiotropium Bromide Monohydrate) .... Once Daily 2)  Advair Diskus 250-50 Mcg/dose  Misc (Fluticasone-Salmeterol) .Marland Kitchen.. 1 Puff Two Times A Day 3)  Ventolin Hfa 108 (90 Base) Mcg/act  Aers (Albuterol Sulfate) .... 2 Puffs Four Times A Day Prn 4)  Ultram 50 Mg  Tabs (Tramadol Hcl) .... 2 By Mouth At Bedtime 5)  Toprol Xl 50 Mg  Tb24 (Metoprolol Succinate) .Marland Kitchen.. 1 Once Daily 6)  Multivitamins   Tabs (Multiple Vitamin) .... Once Daily 7)  Lasix 40 Mg  Tabs (Furosemide) .... Once Daily 8)  Clonazepam 0.5 Mg  Tabs (Clonazepam) .... Three Times A Day 9)  Hydroxyzine Hcl 25 Mg  Tabs (Hydroxyzine Hcl) .... Three Times A Day As Needed 10)  Lovaza 1 Gm  Caps (Omega-3-Acid Ethyl Esters) .... Two Tablets Daily 11)  Evista 60 Mg  Tabs (Raloxifene Hcl) .... Take 1 Tablet By Mouth Once A Day 12)  Crestor 10 Mg  Tabs (Rosuvastatin Calcium) .... Take 1 Tablet By Mouth Once A Day 13)  Plavix 75 Mg  Tabs (Clopidogrel Bisulfate) .... Take 1 Tablet By Mouth Once A Day 14)  Omeprazole 20 Mg  Cpdr (Omeprazole) .... Take 1 Tab By Mouth At Bedtime As Needed 15)  Lantus 100 Unit/ml Soln (Insulin Glargine) .Marland Kitchen.. 10 Unbits At Bedtime 16)  Novolog 100 Unit/ml Soln (Insulin Aspart) .... 5 Units Three Times A Day At Meals  Allergies: 1)  ! * Lyrica 2)  Codeine 3)  Inderal 4)  Vioxx 5)  Celebrex 6)  * Metronizadol  Orders Added: 1)  Pulmonary Stress (6 min walk) [94620]   Six Minute Walk Test Medications taken before test(dose and time): 1)  Spiriva Handihaler 18 Mcg  Caps (Tiotropium Bromide Monohydrate) .... Once Daily 2)  Advair Diskus 250-50 Mcg/dose  Misc (Fluticasone-Salmeterol) .Marland Kitchen.. 1 Puff Two Times A Day 3)  Ventolin Hfa 108 (90 Base) Mcg/act  Aers (Albuterol Sulfate) .... 2 Puffs  Four Times A Day Prn 5)  Toprol Xl 50 Mg  Tb24 (Metoprolol Succinate) .Marland Kitchen.. 1 Once Daily 6)  Multivitamins   Tabs (Multiple Vitamin) .... Once Daily 7)  Lasix 40 Mg  Tabs (Furosemide) .... Once Daily 8)  Clonazepam 0.5 Mg  Tabs (Clonazepam) .... Three Times A Day 9)  Hydroxyzine Hcl 25 Mg  Tabs (Hydroxyzine Hcl) .... Three Times A Day As Needed 10)  Lovaza 1 Gm  Caps (Omega-3-Acid Ethyl Esters) .... Two Tablets Daily 11)  Evista 60 Mg  Tabs (Raloxifene Hcl) .... Take 1 Tablet By Mouth Once A Day 12)  Crestor 10 Mg  Tabs (Rosuvastatin Calcium) .... Take 1 Tablet By Mouth Once A Day 13)  Plavix 75 Mg  Tabs (Clopidogrel Bisulfate) .... Take 1 Tablet By Mouth Once A Day 16)  Novolog 100 Unit/ml Soln (Insulin Aspart) .... 5 Units Three Times A Day At Meals Pt took all am meds at 10:00 except SPIRIVA- pt took this at 2pm today-nurse was informed Supplemental oxygen during the test: No  Lap counter(place a tick mark inside a square for each lap completed) lap 1 complete  lap 2 complete   lap 3 complete   lap 4 complete   Baseline  BP sitting: 94/ 64 Heart rate:  89 Dyspnea ( Borg scale) 1 Fatigue (Borg scale) 0 SPO2 96  End Of Test  BP sitting: 114/ 74 Heart rate: 111 Dyspnea ( Borg scale) 3 Fatigue (Borg scale) 0 SPO2 81  2 Minutes post  BP sitting: 114/ 72 Heart rate: 98 SPO2 91  Stopped or paused before six minutes? Yes Reason: sats dropped to 81%  Interpretation: Number of laps  4 X 48 meters =   192 meters =    192 meters   Total distance walked in six minutes: 192 meters  Tech ID: Tivis Ringer, CNA (December 06, 2010 4:26 PM) Tech Comments Pt stopped w/ 3.5 mins remaining. Sats dropped to 81%. Pt was given 2L of O2. Sats rose to 91% and were at 96% on 2L at 2 mins post.    Appended Document: SIX MIN WALK-PULM STRESS TEST 6 MWT- there was significant oxygen drop with exercise. We will discuss at next ov.  Appended Document: SIX MIN WALK-PULM STRESS TEST ATC home  number-unable to leave message and no answer. Will try again later.  Appended Document: SIX MIN WALK-PULM STRESS TEST Pt has pending appt on 12-20-10; will review at OV.

## 2011-02-05 ENCOUNTER — Encounter (INDEPENDENT_AMBULATORY_CARE_PROVIDER_SITE_OTHER): Payer: PRIVATE HEALTH INSURANCE

## 2011-02-05 DIAGNOSIS — I739 Peripheral vascular disease, unspecified: Secondary | ICD-10-CM

## 2011-03-11 LAB — URINE MICROSCOPIC-ADD ON

## 2011-03-11 LAB — GLUCOSE, CAPILLARY
Glucose-Capillary: 161 mg/dL — ABNORMAL HIGH (ref 70–99)
Glucose-Capillary: 214 mg/dL — ABNORMAL HIGH (ref 70–99)
Glucose-Capillary: 234 mg/dL — ABNORMAL HIGH (ref 70–99)
Glucose-Capillary: 238 mg/dL — ABNORMAL HIGH (ref 70–99)
Glucose-Capillary: 268 mg/dL — ABNORMAL HIGH (ref 70–99)
Glucose-Capillary: 585 mg/dL (ref 70–99)
Glucose-Capillary: >600 mg/dL (ref 70–99)
Glucose-Capillary: >600 mg/dL (ref 70–99)

## 2011-03-11 LAB — URINALYSIS, ROUTINE W REFLEX MICROSCOPIC
Bilirubin Urine: NEGATIVE
Glucose, UA: >1000 mg/dL — AB
Ketones, ur: 15 mg/dL — AB
Protein, ur: 100 mg/dL — AB
pH: 5.5 (ref 5.0–8.0)

## 2011-03-11 LAB — CBC
HCT: 48.4 % — ABNORMAL HIGH (ref 36.0–46.0)
Hemoglobin: 16.6 g/dL — ABNORMAL HIGH (ref 12.0–15.0)
MCH: 29.5 pg (ref 26.0–34.0)
MCV: 86 fL (ref 78.0–100.0)
Platelets: 226 10*3/uL (ref 150–400)
Platelets: 248 10*3/uL (ref 150–400)
RBC: 5.63 MIL/uL — ABNORMAL HIGH (ref 3.87–5.11)
RBC: 5.93 MIL/uL — ABNORMAL HIGH (ref 3.87–5.11)
WBC: 12.3 10*3/uL — ABNORMAL HIGH (ref 4.0–10.5)

## 2011-03-11 LAB — BLOOD GAS, ARTERIAL
Acid-Base Excess: 4.2 mmol/L — ABNORMAL HIGH (ref 0.0–2.0)
Bicarbonate: 27.8 mEq/L — ABNORMAL HIGH (ref 20.0–24.0)
FIO2: 0.21 %
O2 Saturation: 86.2 %
Patient temperature: 98.6
pO2, Arterial: 50.9 mmHg — ABNORMAL LOW (ref 80.0–100.0)

## 2011-03-11 LAB — BASIC METABOLIC PANEL
BUN: 11 mg/dL (ref 6–23)
CO2: 28 mEq/L (ref 19–32)
Chloride: 104 mEq/L (ref 96–112)
Chloride: 84 mEq/L — ABNORMAL LOW (ref 96–112)
Chloride: 98 mEq/L (ref 96–112)
Creatinine, Ser: 1.49 mg/dL — ABNORMAL HIGH (ref 0.4–1.2)
Creatinine, Ser: 1.63 mg/dL — ABNORMAL HIGH (ref 0.4–1.2)
Creatinine, Ser: 2.15 mg/dL — ABNORMAL HIGH (ref 0.4–1.2)
GFR calc Af Amer: 27 mL/min — ABNORMAL LOW (ref >60)
GFR calc Af Amer: 37 mL/min — ABNORMAL LOW (ref >60)
GFR calc Af Amer: 41 mL/min — ABNORMAL LOW (ref >60)
GFR calc non Af Amer: 30 mL/min — ABNORMAL LOW (ref >60)
GFR calc non Af Amer: 34 mL/min — ABNORMAL LOW (ref >60)
Glucose, Bld: 672 mg/dL (ref 70–99)
Potassium: 4.5 mEq/L (ref 3.5–5.1)

## 2011-03-11 LAB — POCT I-STAT 3, VENOUS BLOOD GAS (G3P V)
Bicarbonate: 29.8 mEq/L — ABNORMAL HIGH (ref 20.0–24.0)
TCO2: 31 mmol/L (ref 0–100)
pCO2, Ven: 45.1 mmHg (ref 45.0–50.0)
pH, Ven: 7.427 — ABNORMAL HIGH (ref 7.250–7.300)

## 2011-03-11 LAB — COMPREHENSIVE METABOLIC PANEL
Alkaline Phosphatase: 77 U/L (ref 39–117)
BUN: 17 mg/dL (ref 6–23)
CO2: 24 mEq/L (ref 19–32)
Chloride: 102 mEq/L (ref 96–112)
Creatinine, Ser: 1.55 mg/dL — ABNORMAL HIGH (ref 0.4–1.2)
GFR calc non Af Amer: 32 mL/min — ABNORMAL LOW (ref >60)
Glucose, Bld: 180 mg/dL — ABNORMAL HIGH (ref 70–99)
Total Bilirubin: 1 mg/dL (ref 0.3–1.2)

## 2011-03-11 LAB — URINE CULTURE

## 2011-03-11 LAB — DIFFERENTIAL
Lymphocytes Relative: 12 % (ref 12–46)
Lymphs Abs: 1.4 10*3/uL (ref 0.7–4.0)
Neutro Abs: 9.8 10*3/uL — ABNORMAL HIGH (ref 1.7–7.7)
Neutrophils Relative %: 80 % — ABNORMAL HIGH (ref 43–77)

## 2011-03-11 LAB — HEMOGLOBIN A1C
Hgb A1c MFr Bld: 13.2 % — ABNORMAL HIGH (ref <5.7)
Mean Plasma Glucose: 332 mg/dL — ABNORMAL HIGH (ref <117)

## 2011-03-11 LAB — TSH: TSH: 0.678 u[IU]/mL (ref 0.350–4.500)

## 2011-03-11 LAB — T4, FREE: Free T4: 1.52 ng/dL (ref 0.80–1.80)

## 2011-04-07 ENCOUNTER — Other Ambulatory Visit (HOSPITAL_BASED_OUTPATIENT_CLINIC_OR_DEPARTMENT_OTHER): Payer: Self-pay | Admitting: Internal Medicine

## 2011-04-07 DIAGNOSIS — Z1231 Encounter for screening mammogram for malignant neoplasm of breast: Secondary | ICD-10-CM

## 2011-04-10 ENCOUNTER — Ambulatory Visit
Admission: RE | Admit: 2011-04-10 | Discharge: 2011-04-10 | Disposition: A | Discharge status: Home / Self Care | Payer: PRIVATE HEALTH INSURANCE | Source: Ambulatory Visit | Attending: Internal Medicine | Admitting: Internal Medicine

## 2011-04-10 DIAGNOSIS — Z1231 Encounter for screening mammogram for malignant neoplasm of breast: Secondary | ICD-10-CM

## 2011-04-16 ENCOUNTER — Other Ambulatory Visit: Payer: Self-pay | Admitting: *Deleted

## 2011-04-16 MED ORDER — FLUTICASONE-SALMETEROL 250-50 MCG/DOSE IN AEPB: 1.0000 | INHALATION_SPRAY | Freq: Two times a day (BID) | RESPIRATORY_TRACT | Status: DC

## 2011-05-13 NOTE — Consult Note (Signed)
NEW PATIENT CONSULTATION   Jennifer Duran, Jennifer Duran  DOB:  08-Mar-1929                                       01/04/2008  NWGNF#:[id number]   I saw the patient in the office today in consultation concerning her  bilateral lower extremity leg pain.  She was referred by Dr.  Chaney Born.   This is a pleasant 77 year old woman who noted the gradual onset of  bilateral lower extremity pain associated with ambulation, which began  approximately a year ago.  Her symptoms have gradually progressive over  the last year.  She states that she experiences pain in both knees and  both hips, associated with ambulation.  The pain is most significant in  her right hip, and she also experiences some pain in the right hip when  she is lying on her right side.  She has some mild calf claudication  bilaterally.  I did not get any history of rest pain or any history of  nonhealing wounds.  She does have a history of significant peripheral  neuropathy in her feet.  She did undergo a Doppler study on December 02, 2007, which showed an ankle brachial index of 90% on the right and 60%  on the left.  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   Her past medical history is significant for hypertension,  hypercholesterolemia, and emphysema.  She denies any history of  diabetes, history of previous myocardial infarction, history of  congestive heart failure.  She also has a history of Hashimoto's  thyroiditis and osteoporosis.   She has had cataract surgery in both eyes and also previous breast cysts  removed, as documented on the medical history form in her chart.   Medications and review of systems are documented on the medical history  form in her chart.   PHYSICAL EXAMINATION:  This is a pleasant 76 year old woman who appears  her stated age.  Blood pressure is 117/69.   Heart rate is 79.  Extraocular movements are intact.  Neck is supple without cervical  lymphadenopathy.  I do not detect any carotid bruits.  Lungs are clear  bilaterally to auscultation.  On cardiac exam, she has a regular rate  and rhythm.  Her abdomen is soft and nontender.  I cannot palpate an  aneurysm.  She has normal-pitched bowel sounds.  She has a normal right  femoral pulse with a diminished but palpable left femoral pulse.  She  has a slightly diminished right popliteal pulse with no popliteal pulse  on the left.  I cannot palpate pulses in either foot, although she has  biphasic Doppler signals in both feet on my Doppler exam.  She has no  ischemic ulcers in her feet and no significant lower extremity swelling.  Neurologic exam is nonfocal.   I did review her Doppler study from December, which showed an ABI of 90%  on the right and 60% on the left.   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  She may have some mild infra-inguinal  arterial occlusive disease, but I think has fairly reasonable flow with  biphasic Doppler signals in both feet.  I do not think  her symptoms fit  with that from peripheral vascular disease, as most of her pain is in  the right hip, where she has a normal femoral pulse.  On the side with  evidence of iliac disease, she has minimal hip pain, and this occurs  even at rest.  Fortunately, she is not a smoker.  I have simply  encouraged her to stay as active as possible with respect to her  peripheral vascular disease.  If her pain progresses, it may be that her  arthritis has to be treated aggressively.  I will be happy to see her  back at any time if any new vascular issues arise.   Di Kindle. Edilia Bo, M.Duran.  Electronically Signed   CSD/MEDQ  Duran:  01/04/2008  T:  01/05/2008  Job:  [id number]   cc:   Samara Snide, MD

## 2011-05-13 NOTE — Assessment & Plan Note (Signed)
OFFICE VISIT   Jennifer Duran, Jennifer Duran  DOB:  February 20, 1929                                       05/09/2010  ZOXWR#:[id number]   I saw the patient in the office today for continued followup of her  peripheral vascular disease.  I had last seen her in January of 2009.  At that time she had been stable claudication and we did not recommend  any further vascular workup.  She comes in now for a followup visit.  She was referred by Dr. Leanord Hawking.  This is a pleasant 77 year old woman  who has had stable claudication in both lower extremities that involves  her hip, thigh and calf bilaterally.  Her symptoms are brought on by  ambulation and relieved with rest.  These occur at a fairly short  distance when she is walking in the store.  Her symptoms are more  significant on the right side.  There are no other aggravating or  alleviating factors.  Her symptoms have gradually progressed over the  last year.  She has had no history of rest pain and no history of  nonhealing ulcers.   PAST MEDICAL HISTORY:  Significant for hypertension,  hypercholesterolemia and emphysema.  She denies any history of diabetes,  history of previous myocardial infarction or history of congestive heart  failure.   SOCIAL HISTORY:  She is married.  She has three children.  She quit  tobacco 10 years ago.   REVIEW OF SYSTEMS:  CARDIOVASCULAR:  She has had no chest pain, chest  pressure, palpitations or arrhythmias.  She does admit to dyspnea on  exertion.  She has had no history of stroke, TIAs or amaurosis fugax.  She has had no history of DVT or phlebitis.  NEUROLOGIC:  She has had no dizziness, blackouts, headaches or seizures.   PHYSICAL EXAMINATION:  General:  This is a pleasant 77 year old woman  who appears her stated age.  Vital signs:  Her blood pressure is 116/71,  heart rate is 78, respiratory rate is 18.  Lungs:  Are clear bilaterally  to auscultation without rales, rhonchi or  wheezing.  HEENT:  Unremarkable.  Cardiovascular:  I do not detect any carotid bruits.  She  has a regular rate and rhythm without murmur appreciated.  I cannot  palpate a left femoral pulse.  She does have a palpable right femoral  pulse.  I cannot palpate popliteal or pedal pulses on either side.  She  has no ischemic ulcers.  She does have some varicose veins bilaterally.  Abdomen:  Soft and nontender with normal pitched bowel sounds.  No  masses appreciated.  Neurologic:  She has no focal weakness or  paresthesias.   I have independently interpreted an arterial Doppler study which shows  biphasic waveforms in the dorsalis pedis and posterior tibial positions  bilaterally.  She has an ABI of 84% on the right and 60% on the left.   Based on her exam she has evidence of left iliac artery occlusive  disease and possibly infrainguinal arterial occlusive disease on the  left.  On the right side she has evidence of infrainguinal arterial  occlusive disease.  I have encouraged her to do as much walking as  possible to maintain collateral flow.  We have explained that if her  symptoms became disabling certainly we could consider arteriography and  she might be a candidate possibly for iliac angioplasty.  However,  currently she feels that her symptoms are quite tolerable and is  agreeable to continue with conservative treatment.  Given her age I  would also favor this approach.  I plan on seeing her back in 6 months  with followup ABIs which I have ordered.  She knows to call sooner if  she has problems.     Di Kindle. Edilia Bo, M.D.  Electronically Signed   CSD/MEDQ  D:  05/09/2010  T:  05/10/2010  Job:  [id number]   cc:   Maxwell Caul, M.D.

## 2011-05-15 ENCOUNTER — Other Ambulatory Visit: Payer: Self-pay | Admitting: *Deleted

## 2011-05-15 MED ORDER — TIOTROPIUM BROMIDE MONOHYDRATE 18 MCG IN CAPS: 18.0000 ug | ORAL_CAPSULE | Freq: Every day | RESPIRATORY_TRACT | Status: DC

## 2011-05-16 NOTE — Assessment & Plan Note (Signed)
[facility]                             PULMONARY OFFICE NOTE   NAME:Jennifer Duran, Jennifer Duran                    MRN:          [id number]  DATE:03/09/2007                            DOB:          June 22, 1929    PULMONARY OFFICE FOLLOWUP   PROBLEMS:  1. Asthma/chronic obstructive pulmonary disease.  2. Hypertension.   HISTORY:  A 1-year followup.  She has changed primary care now to  [facility].  She had a flu-like illness this winter, but has  largely recovered.  We reviewed her history that she has had  pneumococcal vaccine twice.  Currently, she feels pretty stable.   MEDICATIONS:  1. Ultram 50 mg b.i.d.  2. Spiriva.  3. Toprol 50 mg x1/2.  4. Advair 250/50.  5. Lasix 40 mg.  6. Clonazepam 0.5 mg t.i.d. p.r.n.  7. Nexium.  8. Eye drops.  9. Albuterol rescue inhaler p.r.n.  10.Previous nasal steroid use p.r.n.  11.Clarinex.   DRUG INTOLERANCES:  CODEINE, INDERAL, LYRICA, METRONIDAZOLE, VIOXX,  CELEBREX, and SEAFOOD.   OBJECTIVE:  Weight 144 pounds, BP 124/72, pulse regular 80, room air  saturation 91%.  There are trace crackles minimally heard in the lower 1/3 of each lung.  Not really rales and not wheeze.  There is no dullness.  No increased  work of breathing.  No stridor.  Neck veins are not distended.  There is no peripheral edema or cyanosis,  and no adenopathy.  HEART:  Sounds are regular without murmur.   IMPRESSION:  Asthma/chronic obstructive pulmonary disease, clinically  stable.  Mild Cor pulmonale is controlled.  Rhinitis has not flared with  the spring season yet.   PLAN:  Schedule return in 1 year.  We refilled Nasonex and rescue  albuterol with discussion.     Clinton D. Maple Hudson, MD, Tonny Bollman, FACP  Electronically Signed    CDY/MedQ  DD: 03/09/2007  DT: 03/11/2007  Job #: [id number]   cc:   Bertram Millard. Hyacinth Meeker, M.D.

## 2011-05-16 NOTE — Discharge Summary (Signed)
[facility]  Patient:    Jennifer Duran, Jennifer Duran Visit Number: [id number] MRN: [id number]          Service Type: MED Location: [phone] Attending Physician:  Jetty Duhamel Driver Dictated by:   Charlaine Dalton. Sherene Sires, M.D. [facility] Admit Date:  08/26/2001 Discharge Date: 08/30/2001   CC:         Clinton D. Maple Hudson, M.D.  Reuben Likes, M.D.   Discharge Summary  PRIMARY CARE PHYSICIAN:  Reuben Likes, M.D.  PULMONARY PHYSICIAN:  Clinton D. Maple Hudson, M.D.  FINAL DIAGNOSES: 1. Chronic obstructive pulmonary disease exacerbation with refractory    asthmatic bronchitis felt to be possibly related to use of Inderal for    hypertension. 2. Underlying chronic obstructive pulmonary disease with at least moderate    pulmonary hypertension by previous echocardiogram. 3. Hypertension. 4. History of hyperlipidemia. 5. Peripheral neuropathy.  HISTORY OF PRESENT ILLNESS:  Please see dictated history and physical from August 26, 2001.  The patient was admitted with refractory wheezing that developed in a setting after being switched from atenolol to Inderal for hypertension.  She was short of breath just walking across the room even on oxygen.  She gradually improved after discontinuation of Inderal and was treated for typical COPD exacerbation with a combination of albuterol, Atrovent and steroids.  We switched her over to Advair on August 31 and changed her nebulizers to q.i.d.  She no longer needed the nebulizers.  We switched her Inderal to Toprol and added Norvasc which yielded adequate blood pressure control.  Prior to discharge, she was up and ambulating without any significant dyspnea over baseline with no wheezing on examination.  DISCHARGE MEDICATIONS:  She is therefore being discharged in improved condition on the following medications: 1. Premarin 0.625 mg q.a.m. as before. 2. Amitriptyline 10 mg q.h.s. as before. 3. Detrol 2 mg 1 q.a.m. as before. 4.  Fosamax 70 mg 1 weekly as before. 5. Mysoline 50 mg 1 q.h.s. as before. 6. Ultram 50 mg tablets as before.  NEW MEDICATIONS: 1. Norvasc 5 mg 1 q.a.m. 2. Toprol XL 50 mg 1/2 q.a.m. 3. Advair 250/50 1 b.i.d. 4. For symptomatic swelling she can go back on the furosemide, she was    taking 1 daily p.r.n.  DIET/FOLLOWUP:  She is to maintain a low salt diet and be followed by Dr. Maple Hudson in a week to see if she has been maintained wheeze free on this regimen. ictated by:   Charlaine Dalton. Sherene Sires, M.D. [facility] Attending Physician:  Jetty Duhamel Driver DD:  62/13/08 TD:  08/30/01 Job: [id number]

## 2011-05-16 NOTE — Op Note (Signed)
[place]  Patient:    Jennifer Duran, Jennifer Duran Visit Number: [id number] MRN: [id number]          Service Type: DSU Location: [facility] Attending Physician:  Twana First Dictated by:   Elana Alm Thurston Hole, M.D. Proc. Date: 03/14/02 Admit Date:  03/14/2002                             Operative Report  PREOPERATIVE DIAGNOSIS: Right shoulder partial rotator cuff tear and impingement.  POSTOPERATIVE DIAGNOSIS: Right shoulder rotator cuff tendonitis and impingement.  OPERATION/PROCEDURE:  1. Right shoulder examination under anesthesia followed by arthroscopic     partial rotator cuff tear debridement.  2. Right shoulder subacromial decompression.  SURGEON: Elana Alm. Thurston Hole, M.D.  ASSISTANT: Julien Girt, P.A.  ANESTHESIA: General.  OPERATIVE TIME: Thirty minutes.  COMPLICATIONS: None.  INDICATIONS FOR PROCEDURE: This is a 77 year old woman, who has had significant right shoulder pain for the past three to four months, increasing in nature, with signs and symptoms consistent with rotator cuff tendonitis and impingement, who had failed conservative care and is now to undergo arthroscopy.  DESCRIPTION OF PROCEDURE: The patient was brought to the operating room on March 14, 2002 after a block had been placed in the holding room.  She was placed on the operative tablet in a supine position.  Her right shoulder was examined under anesthesia after being placed under general anesthesia.  She had full range of motion of her shoulder with stable ligamentous examination. She was then placed in a beach chair position and her shoulder and arm prepped using sterile Betadine and draped using sterile technique.  Originally through a posterior arthroscopic portal an arthroscope with a pump attached was placed and through an anterior portal an arthroscopic probe was placed.  On initial inspection the articular cartilage in the glenohumeral joint was  intact.  The anterior and superior labrum showed partial tearing, 25-30%, which was debrided.  Inferior labrum and anterior inferior glenohumeral ligaments were intact.  Posterior labrum was intact.  The rotator cuff was thoroughly inspected and there was found to be no evidence of a tear.  Biceps tendon was intact.  Biceps tendon anchor was intact.  Inferior capsular recess was free of pathology.  The glenohumeral articular cartilage was intact.  The subacromial space was entered and a lateral arthroscopic portal was made. Moderately thickened bursitis was resected.  Underneath this the rotator cuff was found to be inflamed and thickened, but no evidence of a tear. Subacromial decompression was carried out, removing 6-8 mm of the undersurface of the anterior, anterolateral, and anterior medial acromion and CA ligament release carried out as well.  The [facility] joint was not disturbed.  After this was done the shoulder could be brought through full range of motion with no impingement on the rotator cuff.  At this point it was felt that all pathology had been satisfactorily addressed.  The instruments were removed.  Portals were closed with 3-0 nylon suture and injected with 0.25% Marcaine with epinephrine.  Sterile dressings and a sling were applied and the patient awakened and taken to the recovery room in stable condition.  FOLLOW-UP CARE: She will be followed as an outpatient, on Vicodin for pain. See her back in the office for sutures out and follow-up. Dictated by:   Elana Alm Thurston Hole, M.D. Attending Physician:  Twana First DD:  03/14/02 TD:  03/15/02 Job: [id number]

## 2011-05-16 NOTE — Procedures (Signed)
[place]  Patient:    Jennifer Duran, Jennifer Duran                    MRN: [id number] Proc. Date: 04/26/01 Adm. Date:  [id number] Attending:  Orland Mustard CC:         Reuben Likes, M.D.   Procedure Report  PROCEDURE:  Colonoscopy.  MEDICATIONS:  Fentanyl 100 mcg, Versed 10 mg IV.  SCOPE:  Olympus adult video colonoscope.  INDICATIONS:  Patient has had a strong family history of colon cancer.  He had a colonoscopy with two very large polyps removed that were villus in the past. This is done as a 3-year follow-up.  DESCRIPTION OF PROCEDURE:  Procedure has been explained to patient, consent obtained.  The patient in left lateral decubitus position, the Olympus video colonoscope was inserted and advanced under direct visualization.  The prep was good.  Patient did have fairly marked diverticular disease at the sigmoid colon.  Once we were able to pass this way, we advanced easily to the cecum. The right lower quadrant was transilluminated.  Ileocecal valve and appendiceal orifice seen.  Scope withdrawn and the cecum, ascending colon, hepatic flexure, transverse colon, splenic flexure, descending and sigmoid colon were seen well upon withdrawal.  No further polyps were seen.  Again, diverticular disease in the sigmoid, internal hemorrhoids in the rectum.  The scope was withdrawn. The patient tolerated the procedure well and was maintained on low-flow oxygen and pulse oximeter through out the procedure with no obvious problem.  ASSESSMENT: 1. No evidence of further polyps. 2. Diverticular disease and internal hemorrhoids.  PLAN:  Will recommend repeating in 3 years. DD:  04/26/01 TD:  04/26/01 Job: [id number]

## 2011-05-16 NOTE — H&P (Signed)
[facility]  Patient:    Jennifer Duran, Jennifer Duran Visit Number: [id number] MRN: [id number]          Service Type: MED Location: 2S 4010 01 Attending Physician:  Jetty Duhamel Driver Dictated by:   Rennis Chris. Maple Hudson, M.D. Adm. Date:  08/26/2001   CC:         Reuben Likes, M.D.   History and Physical  ADMITTING DIAGNOSIS:  Wheezing dyspnea, acute bronchitic exacerbation of chronic obstructive pulmonary disease.  HISTORY OF PRESENT ILLNESS:  This is a 77 year old woman followed by Dr. Reuben Likes for primary care and by me for COPD with previous echocardiogram evidence of pulmonary hypertension.  She has had episodes of wheezing and shortness of breath, especially over the last year or so, and finally quit smoking in January.  Office pulmonary function test at that time showed a forced expiratory volume in 1 second of 1200 cc (54% of predicted), representing a 20% improvement over baseline after bronchodilator.  She had called [place] with wheezing and responded nicely to an outpatient prednisone taper two or three weeks ago.  She then felt fairly stable until yesterday when she went to call to her grandchildren and began wheezing.  Labored wheezing and shortness of breath lasted through the night.  She came to the office today where she was given albuterol by nebulizer, Depo-Medrol 80 mg IM and then aminophylline 125 mg IV and Solu-Medrol 40 mg IV.  On the way home, she felt that she was having another "attack" and came to the [facility] Emergency Room where she showed little initial response to bronchodilator nebulizers and is admitted.  Chest x-ray:  In my office today, there was mild hyperinflation with normal heart size, mild interstitial scarring without congestive failure, pulmonary infiltrate, mass, adenopathy or effusion.  Impression was COPD consistent with bronchitis.  REVIEW OF SYSTEMS:  Wheeze, dyspnea, scant white sputum, chronic  mild bilateral leg edema.  No headache, palpitation, chest pain, fever or chills, blood or purulent discharge, reflux or heartburn, nausea or vomiting, dysuria, constipation or calf pain.  PAST HISTORY:  Recurrent bronchitis, pneumonia at least once with pneumococcal vaccine once and yearly flu vaccine.  No history of heart disease. Echocardiogram in 2001 showed normal left ventricular ejection fraction with moderate pulmonary hypertension at 40 to 50 mmHg.  Past history of vein stripping for peripheral venous insufficiency but no history of deep venous thrombosis or pulmonary embolism.  No history of cancer or diabetes.  She has had a peripheral neuropathy attributed to previous therapy with metronidazole; previously also treated for essential tremor, duodenal ulcer, hyperlipidemia, urinary incontinence.  FAMILY HISTORY:  Father with lupus; mother died of lung cancer; one child has renal disease.  SOCIAL HISTORY:  Retired, married, quit cigarettes about six months ago. Three grown children.  Finished a pulmonary rehabilitation program at [facility] this year.  ALLERGIC:  CODEINE, VIOXX, CELEBREX, METRONIDAZOLE.  Rash from [facility].  PHYSICAL EXAMINATION:  GENERAL:  Well-developed, well-nourished, alert woman, fully oriented, somewhat tired appearing.  In the office this afternoon, she had labored inspiratory and expiratory musical wheezing.  VITAL SIGNS:  Now pulse is regular at about 80 per minute, respirations about 22 per minute on 2 L nasal prong oxygen.  SKIN:  No rash.  ADENOPATHY:  None found.  HEENT:  Pupils reactive.  Edentulous.  Oropharynx clear.  NECK:  Supple without neck vein distention, bruit, stridor or thyromegaly.  CHEST:  Quiet, slow expiratory phase, mild expiratory wheezes but  no accessory muscles being used.  Much improved from earlier exam.  No dullness, rub or rales.  No chest wall tenderness.  BREASTS:  Not examined, denies complaint or  problem.  HEART:  Regular rhythm.  No murmur or gallop.  ABDOMEN:  Soft without hepatosplenomegaly.  Bowel sounds present.  PELVIC:  Not done, not indicated.  RECTAL:  Not done, not indicated.  EXTREMITIES:  Trace edema bilaterally in the feet.  No cyanosis, clubbing or calf tenderness.  No cords.  Negative Homans.  IMPRESSION:  Acute asthmatic bronchitis as exacerbation of chronic obstructive pulmonary disease.  Actual trigger is unclear.  Note that she had been switched from atenolol to Inderal for management of hypertension and tremor some time this summer with suspicion that Inderal may potentiate worse wheezing.  Note blood pressure just recorded 220/103 after bronchodilators, recognizing additional problem of managing hypertension.  Hopefully, she will respond to steroids and bronchodilators.  I will not give beta blockers now but try Norvasc and diuretics and bedrest for her blood pressure this evening. I doubt pulmonary embolism but will arrange Doppler of leg veins and D-dimer. ictated by:   Rennis Chris. Maple Hudson, M.D. Attending Physician:  Jetty Duhamel Driver DD:  16/10/96 TD:  08/27/01 Job: [phone] [id number]

## 2011-05-22 ENCOUNTER — Telehealth: Payer: Self-pay | Admitting: *Deleted

## 2011-05-22 NOTE — Telephone Encounter (Signed)
Per EMR phone message 1.3.12, patient assistance spiriva was left up front for pt to pick up her convenience.  meds never picked up.  Called spoke wit patient, she stated that she forgot about the meds and asked if we could hold the spiriva until her upcoming appt w/ CDY on 6.26.12.  meds placed in back and left up front for patient with a note that she will pick up at the appt.

## 2011-06-04 ENCOUNTER — Telehealth: Payer: Self-pay | Admitting: Internal Medicine

## 2011-06-04 MED ORDER — FLUTICASONE-SALMETEROL 250-50 MCG/DOSE IN AEPB: 1.0000 | INHALATION_SPRAY | Freq: Two times a day (BID) | RESPIRATORY_TRACT | Status: DC

## 2011-06-04 NOTE — Telephone Encounter (Signed)
OK 

## 2011-06-04 NOTE — Telephone Encounter (Signed)
Called spoke with patient who states that she has applied for a patient assistance program thru [employer] where once she has met her out-of-pocket limit for medications, they will take over the cost of her advair 250-22mcg.  Pt states that there is no form for CDY to fill out, she just needs a 90day rx signed by CDY.    rx printed off, 90day supply to last her thru the end of the year (this is when the program ends/restarts).  Pt requests this be mailed to her verified home address.

## 2011-06-04 NOTE — Telephone Encounter (Signed)
rx signed by CDY and placed in the mail.  Called spoke with patient, she is aware rx has been placed in the mail.

## 2011-06-23 ENCOUNTER — Encounter: Payer: Self-pay | Admitting: Internal Medicine

## 2011-06-24 ENCOUNTER — Encounter: Payer: Self-pay | Admitting: Internal Medicine

## 2011-06-24 ENCOUNTER — Ambulatory Visit (INDEPENDENT_AMBULATORY_CARE_PROVIDER_SITE_OTHER): Payer: PRIVATE HEALTH INSURANCE | Admitting: Internal Medicine

## 2011-06-24 VITALS — BP 120/78 | HR 95 | Ht 63.0 in | Wt 137.8 lb

## 2011-06-24 DIAGNOSIS — J449 Chronic obstructive pulmonary disease, unspecified: Secondary | ICD-10-CM

## 2011-06-24 NOTE — Patient Instructions (Signed)
If you decide to try a different portable system for oxygen, please let me know.

## 2011-06-24 NOTE — Progress Notes (Signed)
  Subjective:    Patient ID: Jennifer Duran, female    DOB: Dec 29, 1929, 77 y.o.   MRN: [id number]  HPI 06/24/11- COPD complicated by DM.   Husband and daughter here Last here December 20, 2010- reviewed several labs that visit.  She feels she is doing pretty well. No acute problems since last here. Has stayed in a lot to avoid the Spring pollen and the recent poor air quality and heat. Remains on continuous oxygen at 2 L/M [employer]. Diabetic neuropathic paresthesias can disturb sleep some. She and family wanted to discuss portable concentrator or liquid oxygen as alternative to her portable tanks.   Review of Systems Constitutional:   No weight loss, night sweats,  Fevers, chills, fatigue, lassitude. HEENT:   No headaches,  Difficulty swallowing,  Tooth/dental problems,  Sore throat,                No sneezing, itching, ear ache, nasal congestion, post nasal drip,   CV:  No chest pain,  Orthopnea, PND, swelling in lower extremities, anasarca, dizziness, palpitations  GI  No heartburn, indigestion, abdominal pain, nausea, vomiting, diarrhea, change in bowel habits, loss of appetite  Resp:.  No excess mucus, no productive cough,  No non-productive cough,  No coughing up of blood.  No change in color of mucus.  No wheezing.   Skin: no rash or lesions.  GU: no dysuria, change in color of urine, no urgency or frequency.  No flank pain.  MS:  No joint pain or swelling.  No decreased range of motion.  No back pain.  Psych:  No change in mood or affect. No depression or anxiety.  No memory loss.      Objective:   Physical Exam General- Alert, Oriented, Affect-appropriate, Distress- none acute   O2 2 L/m continuous  Skin- rash-none, lesions- none, excoriation- none  Lymphadenopathy- none  Head- atraumatic  Eyes- Gross vision intact, PERRLA, conjunctivae clear secretions  Ears- Hearing, canals, Tm- normal  Nose- Clear, No-Septal dev, mucus, polyps, erosion, perforation   Throat-  Mallampati II , mucosa clear , drainage- none, tonsils- atrophic  Neck- flexible , trachea midline, no stridor , thyroid nl, carotid no bruit  Chest - symmetrical excursion , unlabored     Heart/CV- RRR , no murmur , no gallop  , no rub, nl s1 s2                     - JVD- none , edema- none, stasis changes- none, varices- none     Lung- clear to P&A, wheeze- none, cough- none , dullness-none, rub- none     Chest wall-   Abd- tender-no, distended-no, bowel sounds-present, HSM- no  Br/ Gen/ Rectal- Not done, not indicated  Extrem- cyanosis- none, clubbing, none, atrophy- none, strength- nl  Neuro- grossly intact to observation         Assessment & Plan:

## 2011-06-26 ENCOUNTER — Ambulatory Visit: Payer: Self-pay | Admitting: Internal Medicine

## 2011-06-29 NOTE — Assessment & Plan Note (Signed)
This is now mostly COPD c/w her smoking hx.

## 2011-06-29 NOTE — Assessment & Plan Note (Signed)
Mostly now fixed COPD with chronic hypoxic respiratory failure

## 2011-08-01 ENCOUNTER — Ambulatory Visit (INDEPENDENT_AMBULATORY_CARE_PROVIDER_SITE_OTHER): Payer: PRIVATE HEALTH INSURANCE | Admitting: Thoracic Diseases

## 2011-08-01 ENCOUNTER — Encounter (INDEPENDENT_AMBULATORY_CARE_PROVIDER_SITE_OTHER): Payer: PRIVATE HEALTH INSURANCE

## 2011-08-01 DIAGNOSIS — I739 Peripheral vascular disease, unspecified: Secondary | ICD-10-CM

## 2011-08-01 NOTE — Progress Notes (Signed)
[facility] Follow-up -PAD  History of Present Illness  Jennifer Duran is a 77 y.o. female patient who presents with chief complaint of BLE PAD. Pt. Has had stable claudication.  She states that her legs are somewhat more achy but she is now on Home O2 and does not ambulate as much as she used to. Pt. denies rest pain; night pain and has no non healing ulcers on either LE. She states she occasionally gets sharp shooting pains down both her legs at night that last a few seconds but do not keep her awake. She also has a new diagnosis of type 2 DM and is on Insulin. Pt has not had previous intervention in either LE  Non-Invasive Vascular Imaging done 08/01/2011  ABI: RIGHT 0.76;  LEFT 0.56  Previous angiogram: No   ROS: 12 point ROS Negative except for new onset IDDM, worsening of COPD requiring oxygen therapy  Past Medical History  Diagnosis Date  . Allergic rhinitis, cause unspecified   . Unspecified essential hypertension   . Cor pulmonale   . COPD (chronic obstructive pulmonary disease)   . Type II or unspecified type diabetes mellitus without mention of complication, not stated as uncontrolled   . Osteoporosis   . Hashimoto's disease    History   Social History  . Marital Status: Married    Spouse Name: N/A    Number of Children: N/A  . Years of Education: N/A   Social History Main Topics  . Smoking status: Former Smoker -- 2.0 packs/day for 53 years    Quit date: 05/03/1999  . Smokeless tobacco: Never Used  . Alcohol Use: Not on file  . Drug Use: Not on file  . Sexually Active: Not on file   Other Topics Concern  . Not on file   Social History Narrative  . No narrative on file     Allergies  Allergen Reactions  . Celecoxib   . Codeine   . Pregabalin     REACTION: rash/hives  . Propranolol Hcl   . Rofecoxib     Current outpatient prescriptions:albuterol (VENTOLIN HFA) 108 (90 BASE) MCG/ACT inhaler, Inhale 2 puffs into  the lungs every 6 (six) hours as needed.  , Disp: , Rfl: ;  clonazePAM (KLONOPIN) 0.5 MG tablet, Take 0.5 mg by mouth 3 (three) times daily as needed.  , Disp: , Rfl: ;  clopidogrel (PLAVIX) 75 MG tablet, Take 75 mg by mouth daily.  , Disp: , Rfl:  Fluticasone-Salmeterol (ADVAIR DISKUS) 250-50 MCG/DOSE AEPB, Inhale 1 puff into the lungs 2 (two) times daily., Disp: 180 each, Rfl: 2;  furosemide (LASIX) 40 MG tablet, Take 40 mg by mouth daily.  , Disp: , Rfl: ;  insulin aspart (NOVOLOG) 100 UNIT/ML injection, Inject 5 Units into the skin 3 (three) times daily before meals.  , Disp: , Rfl:  insulin glargine (LANTUS) 100 UNIT/ML injection, Inject 10 Units into the skin at bedtime.  , Disp: , Rfl: ;  lisinopril (PRINIVIL,ZESTRIL) 10 MG tablet, Take 10 mg by mouth daily.  , Disp: , Rfl: ;  omega-3 acid ethyl esters (LOVAZA) 1 G capsule, Take 2 g by mouth 2 (two) times daily.  , Disp: , Rfl: ;  raloxifene (EVISTA) 60 MG tablet, Take 60 mg by mouth daily.  , Disp: , Rfl:  rosuvastatin (CRESTOR) 10 MG tablet, Take 10 mg by mouth daily.  , Disp: , Rfl: ;  tiotropium (SPIRIVA HANDIHALER) 18 MCG inhalation capsule,  Place 1 capsule (18 mcg total) into inhaler and inhale daily., Disp: 30 capsule, Rfl: 6;  traMADol (ULTRAM) 50 MG tablet, Two by mouth at bedtime , Disp: , Rfl:   Physical Examination   General: A&O x 3, WDWN, Gait normal Eyes: PERRLA,  Pulmonary:  Negative  Rales, Negative rhonchi, & Positive min expiratory wheezing,  Cardiac: regular Rythm ,  Negative Murmurs,  Negative  rubs or gallops Gastrointestinal: soft, NTND, Musculoskeletal:Strength 5/5 all extremities  Skin  No cyanosis or pallor without ulcers of either LE or feet .  Extremities without ischemic changes   Vascular:     RIGHT   LEFT         LOWER EXTREMITY PULSES             RIGHT             LEFT    FEMORAL Present/palp Present/palp        POSTERIOR TIBIAL monophasic by Doppler monophasic by Doppler    DORSALIS PEDIS  monophasic by Doppler monophasic by Doppler   Neurologic: A&O X 3; Appropriate Affect ; SENSATION ;normal; MOTOR FUNCTION: normal 5/5 strength in all tested muscle groups   ASSESSMENT: Jennifer Duran is a 77 y.o. female who presents with known PAD WITH no new symptoms. ABI's remain stable.  New onset IDDM Worsening COPD requiring O2  Plan: ABI's in 6 months   Attending MD:  Waverly Ferrari, MD

## 2011-11-27 ENCOUNTER — Other Ambulatory Visit: Payer: Self-pay | Admitting: Internal Medicine

## 2011-11-27 ENCOUNTER — Ambulatory Visit
Admission: RE | Admit: 2011-11-27 | Discharge: 2011-11-27 | Disposition: A | Discharge status: Home / Self Care | Payer: PRIVATE HEALTH INSURANCE | Source: Ambulatory Visit | Attending: Internal Medicine | Admitting: Internal Medicine

## 2011-11-27 DIAGNOSIS — M545 Low back pain, unspecified: Secondary | ICD-10-CM

## 2011-12-25 ENCOUNTER — Encounter: Payer: Self-pay | Admitting: Internal Medicine

## 2011-12-25 ENCOUNTER — Ambulatory Visit (INDEPENDENT_AMBULATORY_CARE_PROVIDER_SITE_OTHER): Payer: PRIVATE HEALTH INSURANCE | Admitting: Internal Medicine

## 2011-12-25 VITALS — BP 120/76 | HR 124 | Ht 63.0 in | Wt 129.6 lb

## 2011-12-25 DIAGNOSIS — J449 Chronic obstructive pulmonary disease, unspecified: Secondary | ICD-10-CM

## 2011-12-25 MED ORDER — TIOTROPIUM BROMIDE MONOHYDRATE 18 MCG IN CAPS: 18.0000 ug | ORAL_CAPSULE | Freq: Every day | RESPIRATORY_TRACT | Status: DC

## 2011-12-25 NOTE — Progress Notes (Signed)
Patient ID: Jennifer Duran, female    DOB: 10-24-1929, 77 y.o.   MRN: [id number]  HPI 06/24/11- COPD complicated by DM.   Husband and daughter here Last here December 20, 2010- reviewed several labs that visit.  She feels she is doing pretty well. No acute problems since last here. Has stayed in a lot to avoid the Spring pollen and the recent poor air quality and heat. Remains on continuous oxygen at 2 L/M [employer]. Diabetic neuropathic paresthesias can disturb sleep some. She and family wanted to discuss portable concentrator or liquid oxygen as alternative to her portable tanks.   12/26/11- 31 yoF  Former smoker, followed for COPD complicated by DM, HBP. Here w/ husband. Had flu shot. Gradually aware of it easier dyspnea with exertion over the last 6 months. No dramatic events and no recent colds. She does not cough. Has noted a little palpitation at times without chest pain or syncope. She is now using a portable oxygen concentrator-battery lasts for hours-set on 3 L.  Review of Systems Constitutional:   No-   weight loss, night sweats, fevers, chills, fatigue, lassitude. HEENT:   No-  headaches, difficulty swallowing, tooth/dental problems, sore throat,       No-  sneezing, itching, ear ache, nasal congestion, post nasal drip,  CV:  No-   chest pain, orthopnea, PND, swelling in lower extremities, anasarca, dizziness, palpitations Resp: +  shortness of breath with exertion, not at rest.              No-   productive cough,  No non-productive cough,  No- coughing up of blood.              No-   change in color of mucus.  No- wheezing.   Skin: No-   rash or lesions. GI:  No-   heartburn, indigestion, abdominal pain, nausea, vomiting, diarrhea,                 change in bowel habits, loss of appetite GU:  MS:  No-   joint pain or swelling.  No- decreased range of motion.  No- back pain. Neuro-     nothing unusual Psych:  No- change in mood or affect. No depression or anxiety.  No memory  loss.      Objective:  General- Alert, Oriented, Affect-appropriate, Distress- none acute, 3l by her portable concentrator- sat 92% at rest Skin- rash-none, lesions- none, excoriation- none. Color  looks gray Lymphadenopathy- none Head- atraumatic            Eyes- Gross vision intact, PERRLA, conjunctivae clear secretions            Ears- Hearing, canals-normal            Nose- Clear, no-Septal dev, mucus, polyps, erosion, perforation             Throat- Mallampati II , mucosa clear , drainage- none, tonsils- atrophic Neck- flexible , trachea midline, no stridor , thyroid nl, carotid no bruit Chest - symmetrical excursion , unlabored           Heart/CV- RRR , no murmur , no gallop  , no rub, nl s1 s2                           - JVD- none , edema- none, stasis changes- none, varices- none           Lung- clear to P&A- very  distant, wheeze- none, cough- none , dullness-none, rub- none           Chest wall-  Abd- tender-no, distended-no, bowel sounds-present, HSM- no Br/ Gen/ Rectal- Not done, not indicated Extrem- cyanosis- none, clubbing, none, atrophy- none, rolling walker. Neuro- grossly intact to observation

## 2011-12-25 NOTE — Patient Instructions (Signed)
Script for Spiriva sent  Ok to run O2 2-3 L/M based on how you feel

## 2011-12-28 ENCOUNTER — Encounter: Payer: Self-pay | Admitting: Internal Medicine

## 2011-12-28 NOTE — Assessment & Plan Note (Signed)
Slow decline with no specific event. This fits with the natural progression expected of her disease. She has chronic hypoxic respiratory failure and remains dependent on supplemental oxygen. The portable concentrator has been an improvement for her. Plan-refill Spiriva. Medication education talk done.

## 2012-01-27 ENCOUNTER — Telehealth: Payer: Self-pay | Admitting: *Deleted

## 2012-01-27 NOTE — Telephone Encounter (Signed)
Per [place]-ok-insurance won't cover Advair so we can change to Symbicort 160/4.5 #1 2 puffs and rinse bid with prn refills.

## 2012-01-27 NOTE — Telephone Encounter (Signed)
Received a fax from [employer] stating pt's advair diskus 250-50 needed a PA. I called [phone] to initiate. It was denied bc pt must first try and fail symbicort. If pt can't try symbicort then we can submit a statement as to why. Please advise Dr. Maple Hudson, thanks  Allergies  Allergen Reactions  . Celecoxib   . Codeine   . Pregabalin     REACTION: rash/hives  . Propranolol Hcl   . Rofecoxib

## 2012-01-27 NOTE — Telephone Encounter (Signed)
Spoke with pt and explained that she will need to try and fail symbicort before we can get advair approved through her insurance company. She states that she is not willing to try symbicort and has called her insurance company and "blasted them out"- as a result of this she states that she is "getting what I wanted" and her insurance company advised that they would call her pharmacy and let her have the advair at her regular co-pay. I advised that this is not what was relayed to [place] when we called and advised that if she does not try and fail symbicort, will most likely have to pay out of pocket. She verbalized understanding, but disagrees and insists that "getting ugly" with ins co has solved this issue. She does not want the symbicrt called in and so I will close the encounter.

## 2012-02-03 ENCOUNTER — Encounter: Payer: Self-pay | Admitting: Vascular Surgery

## 2012-02-04 ENCOUNTER — Encounter (INDEPENDENT_AMBULATORY_CARE_PROVIDER_SITE_OTHER): Payer: PRIVATE HEALTH INSURANCE | Admitting: *Deleted

## 2012-02-04 ENCOUNTER — Ambulatory Visit (INDEPENDENT_AMBULATORY_CARE_PROVIDER_SITE_OTHER): Payer: PRIVATE HEALTH INSURANCE | Admitting: Vascular Surgery

## 2012-02-04 ENCOUNTER — Encounter: Payer: Self-pay | Admitting: Vascular Surgery

## 2012-02-04 VITALS — BP 112/68 | HR 92 | Resp 16 | Ht 62.0 in | Wt 127.0 lb

## 2012-02-04 DIAGNOSIS — I739 Peripheral vascular disease, unspecified: Secondary | ICD-10-CM

## 2012-02-04 DIAGNOSIS — I745 Embolism and thrombosis of iliac artery: Secondary | ICD-10-CM

## 2012-02-04 NOTE — Assessment & Plan Note (Signed)
This patient has stable peripheral vascular disease of the left lower extremity. 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 Her ABI stable at 50% with a monophasic signals in her left foot. I've encouraged her to stay as active as possible. Unfortunately she is not a smoker. I've ordered ABIs for one year and I'll see her back at that time. She knows to call sooner if she has problems.

## 2012-02-04 NOTE — Progress Notes (Signed)
Vascular and Vein Specialist of [facility]  Patient name: Jennifer Duran MRN: [id number] DOB: 1929-07-01 Sex: female  REASON FOR VISIT: follow up of peripheral vascular disease.  HPI: Jennifer Duran is a 78 y.o. female with a history of peripheral vascular disease of her left lower extremity. I last saw her in May of 2011. She's been seen by the PAs twice since then. She has stable claudication of her left lower extremity which mostly involves her calf. His had no rest pain or nonhealing ulcers. I believe her activity is very limited by her pulmonary status. She is on continuous 02. Is not describe any rest pain or history of nonhealing ulcers. She does occasionally gets an aching pain in her legs which is relieved with elevation in may have some chronic venous insufficiency also. She's had no venous ulcers.  Past Medical History  Diagnosis Date  . Allergic rhinitis, cause unspecified   . Unspecified essential hypertension   . Cor pulmonale   . COPD (chronic obstructive pulmonary disease)   . Type II or unspecified type diabetes mellitus without mention of complication, not stated as uncontrolled   . Osteoporosis   . Hashimoto's disease     History reviewed. No pertinent family history.  SOCIAL HISTORY: History  Substance Use Topics  . Smoking status: Former Smoker -- 2.0 packs/day for 53 years    Quit date: 05/03/1999  . Smokeless tobacco: Never Used  . Alcohol Use: Not on file    Allergies  Allergen Reactions  . Celecoxib   . Codeine   . Metronidazole   . Pregabalin     REACTION: rash/hives  . Propranolol Hcl   . Rofecoxib     Current Outpatient Prescriptions  Medication Sig Dispense Refill  . albuterol (VENTOLIN HFA) 108 (90 BASE) MCG/ACT inhaler Inhale 2 puffs into the lungs every 6 (six) hours as needed.        Marland Kitchen atorvastatin (LIPITOR) 20 MG tablet Take 20 mg by mouth daily.      . clonazePAM (KLONOPIN) 0.5 MG tablet Take 0.5 mg by mouth 3 (three) times daily as  needed.        . clopidogrel (PLAVIX) 75 MG tablet Take 75 mg by mouth daily.        . Fluticasone-Salmeterol (ADVAIR DISKUS) 250-50 MCG/DOSE AEPB Inhale 1 puff into the lungs 2 (two) times daily.  180 each  2  . furosemide (LASIX) 40 MG tablet Take 40 mg by mouth daily.        Marland Kitchen gemfibrozil (LOPID) 600 MG tablet Take 600 mg by mouth 2 (two) times daily before a meal.      . insulin aspart (NOVOLOG) 100 UNIT/ML injection Inject 5 Units into the skin 3 (three) times daily before meals.        . insulin glargine (LANTUS) 100 UNIT/ML injection Inject 10 Units into the skin at bedtime.        Marland Kitchen lisinopril (PRINIVIL,ZESTRIL) 10 MG tablet Take 10 mg by mouth daily.        Marland Kitchen omega-3 acid ethyl esters (LOVAZA) 1 G capsule Take 2 g by mouth 2 (two) times daily.        . raloxifene (EVISTA) 60 MG tablet Take 60 mg by mouth daily.        Marland Kitchen tiotropium (SPIRIVA HANDIHALER) 18 MCG inhalation capsule Place 1 capsule (18 mcg total) into inhaler and inhale daily.  30 capsule  prn  . traMADol (ULTRAM) 50 MG tablet Two by mouth  at bedtime         REVIEW OF SYSTEMS: Arly.Keller ] denotes positive finding; [  ] denotes negative finding  CARDIOVASCULAR:  [ ]  chest pain   [ ]  chest pressure   [ ]  palpitations   [ ]  orthopnea   Arly.Keller ] dyspnea on exertion   Arly.Keller ] claudication   [ ]  rest pain   [ ]  DVT   [ ]  phlebitis PULMONARY:   [ ]  productive cough   [ ]  asthma   [ ]  wheezing NEUROLOGIC:   [ ]  weakness  [ ]  paresthesias  [ ]  aphasia  [ ]  amaurosis  [ ]  dizziness HEMATOLOGIC:   [ ]  bleeding problems   [ ]  clotting disorders MUSCULOSKELETAL:  Arly.Keller ] joint pain   [ ]  joint swelling [ ]  leg swelling GASTROINTESTINAL: [ ]   blood in stool  [ ]   hematemesis GENITOURINARY:  [ ]   dysuria  [ ]   hematuria PSYCHIATRIC:  [ ]  history of major depression INTEGUMENTARY:  [ ]  rashes  [ ]  ulcers CONSTITUTIONAL:  [ ]  fever   [ ]  chills  PHYSICAL EXAM: Filed Vitals:   02/04/12 1340  BP: 112/68  Pulse: 92  Resp: 16  Height: 5\' 2"  (1.575 m)    Weight: 127 lb (57.607 kg)  SpO2: 94%   Body mass index is 23.23 kg/(m^2). GENERAL: The patient is a well-nourished female, in no acute distress. The vital signs are documented above. CARDIOVASCULAR: There is a regular rate and rhythm without significant murmur appreciated. I do not detect carotid bruits. She has a palpable right femoral pulse. I cannot palpate a left femoral pulse. I cannot palpate popliteal or pedal pulses. Both feet appear adequately perfused.  PULMONARY: There is good air exchange bilaterally without wheezing or rales. ABDOMEN: Soft and non-tender with normal pitched bowel sounds.  MUSCULOSKELETAL: There are no major deformities or cyanosis. NEUROLOGIC: No focal weakness or paresthesias are detected. SKIN: There are no ulcers or rashes noted. She has some hyperpigmentation bilaterally and some spider veins bilaterally. PSYCHIATRIC: The patient has a normal affect.  DATA:  I have independently interpreted her arterial Doppler study which shows an ABI of 75% on the right and 49% on the left. She has a triphasic right dorsalis pedis and posterior tibial signal. She has a monophasic left posterior tibial signal and a monophasic left dorsalis pedis signal.  MEDICAL ISSUES:  Peripheral vascular disease This patient has stable peripheral vascular disease of the left lower extremity. 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 Her ABI stable at 50% with a monophasic signals in her left foot. I've encouraged her to stay as active as possible. Unfortunately she is not a smoker. I've ordered ABIs for one year and I'll see her back at that time. She knows to call sooner if she has problems.    DICKSON,CHRISTOPHER S [facility] [place] Beeper: [phone]

## 2012-04-05 ENCOUNTER — Other Ambulatory Visit (HOSPITAL_BASED_OUTPATIENT_CLINIC_OR_DEPARTMENT_OTHER): Payer: Self-pay | Admitting: Internal Medicine

## 2012-04-05 DIAGNOSIS — Z1231 Encounter for screening mammogram for malignant neoplasm of breast: Secondary | ICD-10-CM

## 2012-04-20 ENCOUNTER — Ambulatory Visit
Admission: RE | Admit: 2012-04-20 | Discharge: 2012-04-20 | Disposition: A | Discharge status: Home / Self Care | Payer: PRIVATE HEALTH INSURANCE | Source: Ambulatory Visit | Attending: Internal Medicine | Admitting: Internal Medicine

## 2012-04-20 DIAGNOSIS — Z1231 Encounter for screening mammogram for malignant neoplasm of breast: Secondary | ICD-10-CM

## 2012-04-20 LAB — HM MAMMOGRAPHY: HM Mammogram: NEGATIVE

## 2012-04-24 ENCOUNTER — Emergency Department (HOSPITAL_BASED_OUTPATIENT_CLINIC_OR_DEPARTMENT_OTHER)
Admission: EM | Admit: 2012-04-24 | Discharge: 2012-04-24 | Disposition: A | Discharge status: Home / Self Care | Payer: PRIVATE HEALTH INSURANCE | Attending: Emergency Medicine | Admitting: Emergency Medicine

## 2012-04-24 ENCOUNTER — Encounter (HOSPITAL_BASED_OUTPATIENT_CLINIC_OR_DEPARTMENT_OTHER): Payer: Self-pay | Admitting: *Deleted

## 2012-04-24 DIAGNOSIS — J4489 Other specified chronic obstructive pulmonary disease: Secondary | ICD-10-CM | POA: Insufficient documentation

## 2012-04-24 DIAGNOSIS — I10 Essential (primary) hypertension: Secondary | ICD-10-CM | POA: Insufficient documentation

## 2012-04-24 DIAGNOSIS — Z87891 Personal history of nicotine dependence: Secondary | ICD-10-CM | POA: Insufficient documentation

## 2012-04-24 DIAGNOSIS — J449 Chronic obstructive pulmonary disease, unspecified: Secondary | ICD-10-CM | POA: Insufficient documentation

## 2012-04-24 DIAGNOSIS — E119 Type 2 diabetes mellitus without complications: Secondary | ICD-10-CM | POA: Insufficient documentation

## 2012-04-24 DIAGNOSIS — E860 Dehydration: Secondary | ICD-10-CM | POA: Insufficient documentation

## 2012-04-24 LAB — CBC
MCH: 29.1 pg (ref 26.0–34.0)
Platelets: 298 10*3/uL (ref 150–400)
RBC: 4.88 MIL/uL (ref 3.87–5.11)
WBC: 8.1 10*3/uL (ref 4.0–10.5)

## 2012-04-24 LAB — URINALYSIS, ROUTINE W REFLEX MICROSCOPIC
Bilirubin Urine: NEGATIVE
Glucose, UA: NEGATIVE mg/dL
Ketones, ur: 15 mg/dL — AB
pH: 6.5 (ref 5.0–8.0)

## 2012-04-24 LAB — DIFFERENTIAL
Eosinophils Absolute: 0.1 10*3/uL (ref 0.0–0.7)
Lymphocytes Relative: 16 % (ref 12–46)
Lymphs Abs: 1.3 10*3/uL (ref 0.7–4.0)
Neutrophils Relative %: 76 % (ref 43–77)

## 2012-04-24 LAB — URINE MICROSCOPIC-ADD ON

## 2012-04-24 LAB — COMPREHENSIVE METABOLIC PANEL
BUN: 18 mg/dL (ref 6–23)
CO2: 26 mEq/L (ref 19–32)
Chloride: 107 mEq/L (ref 96–112)
Creatinine, Ser: 0.8 mg/dL (ref 0.50–1.10)
GFR calc Af Amer: 79 mL/min — ABNORMAL LOW (ref >90)
GFR calc non Af Amer: 68 mL/min — ABNORMAL LOW (ref >90)
Glucose, Bld: 172 mg/dL — ABNORMAL HIGH (ref 70–99)
Total Bilirubin: 0.5 mg/dL (ref 0.3–1.2)

## 2012-04-24 LAB — GLUCOSE, CAPILLARY: Glucose-Capillary: 152 mg/dL — ABNORMAL HIGH (ref 70–99)

## 2012-04-24 LAB — TROPONIN I: Troponin I: <0.3 ng/mL (ref <0.30)

## 2012-04-24 MED ORDER — SODIUM CHLORIDE 0.9 % IV BOLUS (SEPSIS)
1000.0000 mL | Freq: Once | INTRAVENOUS | Status: AC
  Administered 2012-04-24: 1000 mL via INTRAVENOUS

## 2012-04-24 NOTE — Discharge Instructions (Signed)
Dehydration, Adult Dehydration is when you lose more fluids from the body than you take in. Vital organs like the kidneys, brain, and heart cannot function without a proper amount of fluids and salt. Any loss of fluids from the body can cause dehydration.  CAUSES   Vomiting.   Diarrhea.   Excessive sweating.   Excessive urine output.   Fever.  SYMPTOMS  Mild dehydration  Thirst.   Dry lips.   Slightly dry mouth.  Moderate dehydration  Very dry mouth.   Sunken eyes.   Skin does not bounce back quickly when lightly pinched and released.   Dark urine and decreased urine production.   Decreased tear production.   Headache.  Severe dehydration  Very dry mouth.   Extreme thirst.   Rapid, weak pulse (more than 100 beats per minute at rest).   Cold hands and feet.   Not able to sweat in spite of heat and temperature.   Rapid breathing.   Blue lips.   Confusion and lethargy.   Difficulty being awakened.   Minimal urine production.   No tears.  DIAGNOSIS  Your caregiver will diagnose dehydration based on your symptoms and your exam. Blood and urine tests will help confirm the diagnosis. The diagnostic evaluation should also identify the cause of dehydration. TREATMENT  Treatment of mild or moderate dehydration can often be done at home by increasing the amount of fluids that you drink. It is best to drink small amounts of fluid more often. Drinking too much at one time can make vomiting worse. Refer to the home care instructions below. Severe dehydration needs to be treated at the hospital where you will probably be given intravenous (IV) fluids that contain water and electrolytes. HOME CARE INSTRUCTIONS   Ask your caregiver about specific rehydration instructions.   Drink enough fluids to keep your urine clear or pale yellow.   Drink small amounts frequently if you have nausea and vomiting.   Eat as you normally do.   Avoid:   Foods or drinks high in  sugar.   Carbonated drinks.   Juice.   Extremely hot or cold fluids.   Drinks with caffeine.   Fatty, greasy foods.   Alcohol.   Tobacco.   Overeating.   Gelatin desserts.   Wash your hands well to avoid spreading bacteria and viruses.   Only take over-the-counter or prescription medicines for pain, discomfort, or fever as directed by your caregiver.   Ask your caregiver if you should continue all prescribed and over-the-counter medicines.   Keep all follow-up appointments with your caregiver.  SEEK MEDICAL CARE IF:  You have abdominal pain and it increases or stays in one area (localizes).   You have a rash, stiff neck, or severe headache.   You are irritable, sleepy, or difficult to awaken.   You are weak, dizzy, or extremely thirsty.  SEEK IMMEDIATE MEDICAL CARE IF:   You are unable to keep fluids down or you get worse despite treatment.   You have frequent episodes of vomiting or diarrhea.   You have blood or green matter (bile) in your vomit.   You have blood in your stool or your stool looks black and tarry.   You have not urinated in 6 to 8 hours, or you have only urinated a small amount of very dark urine.   You have a fever.   You faint.  MAKE SURE YOU:   Understand these instructions.   Will watch your condition.     Will get help right away if you are not doing well or get worse.  Document Released: 12/15/2005 Document Revised: 12/04/2011 Document Reviewed: 08/04/2011 [facility] Patient Information 2012 [place], [place].  Your blood pressure today was mildly elevated . Get it rechecked at your appointment with Dr. Leanord Hawking on 04/29/2013Dehydration, Adult Dehydration is when you lose more fluids from the body than you take in. Vital organs like the kidneys, brain, and heart cannot function without a proper amount of fluids and salt. Any loss of fluids from the body can cause dehydration.  CAUSES   Vomiting.   Diarrhea.   Excessive sweating.    Excessive urine output.   Fever.  SYMPTOMS  Mild dehydration  Thirst.   Dry lips.   Slightly dry mouth.  Moderate dehydration  Very dry mouth.   Sunken eyes.   Skin does not bounce back quickly when lightly pinched and released.   Dark urine and decreased urine production.   Decreased tear production.   Headache.  Severe dehydration  Very dry mouth.   Extreme thirst.   Rapid, weak pulse (more than 100 beats per minute at rest).   Cold hands and feet.   Not able to sweat in spite of heat and temperature.   Rapid breathing.   Blue lips.   Confusion and lethargy.   Difficulty being awakened.   Minimal urine production.   No tears.  DIAGNOSIS  Your caregiver will diagnose dehydration based on your symptoms and your exam. Blood and urine tests will help confirm the diagnosis. The diagnostic evaluation should also identify the cause of dehydration. TREATMENT  Treatment of mild or moderate dehydration can often be done at home by increasing the amount of fluids that you drink. It is best to drink small amounts of fluid more often. Drinking too much at one time can make vomiting worse. Refer to the home care instructions below. Severe dehydration needs to be treated at the hospital where you will probably be given intravenous (IV) fluids that contain water and electrolytes. HOME CARE INSTRUCTIONS   Ask your caregiver about specific rehydration instructions.   Drink enough fluids to keep your urine clear or pale yellow.   Drink small amounts frequently if you have nausea and vomiting.   Eat as you normally do.   Avoid:   Foods or drinks high in sugar.   Carbonated drinks.   Juice.   Extremely hot or cold fluids.   Drinks with caffeine.   Fatty, greasy foods.   Alcohol.   Tobacco.   Overeating.   Gelatin desserts.   Wash your hands well to avoid spreading bacteria and viruses.   Only take over-the-counter or prescription medicines for  pain, discomfort, or fever as directed by your caregiver.   Ask your caregiver if you should continue all prescribed and over-the-counter medicines.   Keep all follow-up appointments with your caregiver.  SEEK MEDICAL CARE IF:  You have abdominal pain and it increases or stays in one area (localizes).   You have a rash, stiff neck, or severe headache.   You are irritable, sleepy, or difficult to awaken.   You are weak, dizzy, or extremely thirsty.  SEEK IMMEDIATE MEDICAL CARE IF:   You are unable to keep fluids down or you get worse despite treatment.   You have frequent episodes of vomiting or diarrhea.   You have blood or green matter (bile) in your vomit.   You have blood in your stool or your stool looks black and tarry.  You have not urinated in 6 to 8 hours, or you have only urinated a small amount of very dark urine.   You have a fever.   You faint.  MAKE SURE YOU:   Understand these instructions.   Will watch your condition.   Will get help right away if you are not doing well or get worse.  Document Released: 12/15/2005 Document Revised: 12/04/2011 Document Reviewed: 08/04/2011 [facility] Patient Information 2012 [place], [place].   Your blood pressure today was mildly elevated. It rechecked at your office appointment with Dr. Leanord Hawking on 04/26/2012

## 2012-04-24 NOTE — ED Provider Notes (Signed)
History     CSN: [id number]  Arrival date & time 04/24/12  1354   None     Chief Complaint  Patient presents with  . Fatigue    (Consider location/radiation/quality/duration/timing/severity/associated sxs/prior treatment) HPI Complains of fatigue and generalized weakness onset approximately 24 hours ago. No pain anywhere no difficulty breathing . Admits to diminished appetite today last bowel movement today normal no fever no other associated symptoms no treatment prior to coming here patient feels she may be mildly dehydrated. Nothing makes symptoms better or worse Past Medical History  Diagnosis Date  . Allergic rhinitis, cause unspecified   . Unspecified essential hypertension   . Cor pulmonale   . COPD (chronic obstructive pulmonary disease)   . Type II or unspecified type diabetes mellitus without mention of complication, not stated as uncontrolled   . Osteoporosis   . Hashimoto's disease     Past Surgical History  Procedure Date  . Total abdominal hysterectomy   . Cataracts   . Rotator cuff repair     No family history on file.  History  Substance Use Topics  . Smoking status: Former Smoker -- 2.0 packs/day for 53 years    Quit date: 05/03/1999  . Smokeless tobacco: Never Used  . Alcohol Use: Not on file    OB History    Grav Para Term Preterm Abortions TAB SAB Ect Mult Living                  Review of Systems  Constitutional: Positive for fatigue.  HENT: Negative.   Respiratory: Negative.   Cardiovascular: Negative.   Gastrointestinal: Negative.   Musculoskeletal: Negative.   Skin: Negative.   Neurological: Negative.   Hematological: Negative.   Psychiatric/Behavioral: Negative.   All other systems reviewed and are negative.    Allergies  Celecoxib; Codeine; Metronidazole; Pregabalin; Propranolol hcl; Rofecoxib; and Iodine  Home Medications   Current Outpatient Rx  Name Route Sig Dispense Refill  . ATORVASTATIN CALCIUM 20 MG PO TABS  Oral Take 20 mg by mouth daily.    Marland Kitchen CLONAZEPAM 0.5 MG PO TABS Oral Take 0.5 mg by mouth 3 (three) times daily as needed.      . CLOPIDOGREL BISULFATE 75 MG PO TABS Oral Take 75 mg by mouth daily.      Marland Kitchen FLUTICASONE-SALMETEROL 250-50 MCG/DOSE IN AEPB Inhalation Inhale 1 puff into the lungs 2 (two) times daily. 180 each 2  . FUROSEMIDE 40 MG PO TABS Oral Take 40 mg by mouth daily.      Marland Kitchen GEMFIBROZIL 600 MG PO TABS Oral Take 600 mg by mouth 2 (two) times daily before a meal.    . INSULIN ASPART 100 UNIT/ML [place] SOLN Subcutaneous Inject 5 Units into the skin 3 (three) times daily before meals.      . INSULIN GLARGINE 100 UNIT/ML [place] SOLN Subcutaneous Inject 10 Units into the skin at bedtime.      Marland Kitchen LISINOPRIL 10 MG PO TABS Oral Take 10 mg by mouth daily.      Marland Kitchen RALOXIFENE HCL 60 MG PO TABS Oral Take 60 mg by mouth daily.      Marland Kitchen TIOTROPIUM BROMIDE MONOHYDRATE 18 MCG IN CAPS Inhalation Place 1 capsule (18 mcg total) into inhaler and inhale daily. 30 capsule prn  . TRAMADOL HCL 50 MG PO TABS  Two by mouth at bedtime     . ALBUTEROL SULFATE HFA 108 (90 BASE) MCG/ACT IN AERS Inhalation Inhale 2 puffs into the lungs every  6 (six) hours as needed.      . OMEGA-3-ACID ETHYL ESTERS 1 G PO CAPS Oral Take 2 g by mouth 2 (two) times daily.        BP 169/71  Pulse 100  Temp(Src) 98.2 F (36.8 C) (Oral)  Resp 20  Ht 5\' 3"  (1.6 m)  Wt 129 lb (58.514 kg)  BMI 22.85 kg/m2  SpO2 95%  Physical Exam  Nursing note and vitals reviewed. Constitutional: She appears well-developed and well-nourished.  HENT:  Head: Normocephalic and atraumatic.  Eyes: Conjunctivae are normal. Pupils are equal, round, and reactive to light.  Neck: Neck supple. No tracheal deviation present. No thyromegaly present.  Cardiovascular: Normal rate and regular rhythm.   No murmur heard. Pulmonary/Chest: Effort normal and breath sounds normal.  Abdominal: Soft. Bowel sounds are normal. She exhibits no distension and no mass. There is no  tenderness.  Musculoskeletal: Normal range of motion. She exhibits no edema and no tenderness.  Neurological: She is alert. Coordination normal.  Skin: Skin is warm and dry. No rash noted.  Psychiatric: She has a normal mood and affect.    ED Course  Procedures (including critical care time)  Date: 04/24/2012  Rate: 85  Rhythm: normal sinus rhythm  QRS Axis: normal  Intervals: normal  ST/T Wave abnormalities: nonspecific T wave changes  Conduction Disutrbances:none  Narrative Interpretation:   Old EKG Reviewed: unchanged Unchanged from 08/27/2001  Labs Reviewed - No data to display No results found. Results for orders placed during the hospital encounter of 04/24/12  URINALYSIS, ROUTINE W REFLEX MICROSCOPIC      Component Value Range   Color, Urine YELLOW  YELLOW    APPearance CLEAR  CLEAR    Specific Gravity, Urine 1.015  1.005 - 1.030    pH 6.5  5.0 - 8.0    Glucose, UA NEGATIVE  NEGATIVE (mg/dL)   Hgb urine dipstick NEGATIVE  NEGATIVE    Bilirubin Urine NEGATIVE  NEGATIVE    Ketones, ur 15 (*) NEGATIVE (mg/dL)   Protein, ur NEGATIVE  NEGATIVE (mg/dL)   Urobilinogen, UA 0.2  0.0 - 1.0 (mg/dL)   Nitrite NEGATIVE  NEGATIVE    Leukocytes, UA TRACE (*) NEGATIVE   COMPREHENSIVE METABOLIC PANEL      Component Value Range   Sodium 142  135 - 145 (mEq/L)   Potassium 3.9  3.5 - 5.1 (mEq/L)   Chloride 107  96 - 112 (mEq/L)   CO2 26  19 - 32 (mEq/L)   Glucose, Bld 172 (*) 70 - 99 (mg/dL)   BUN 18  6 - 23 (mg/dL)   Creatinine, Ser 1.61  0.50 - 1.10 (mg/dL)   Calcium 9.5  8.4 - 09.6 (mg/dL)   Total Protein 6.8  6.0 - 8.3 (g/dL)   Albumin 3.6  3.5 - 5.2 (g/dL)   AST 31  0 - 37 (U/L)   ALT 28  0 - 35 (U/L)   Alkaline Phosphatase 133 (*) 39 - 117 (U/L)   Total Bilirubin 0.5  0.3 - 1.2 (mg/dL)   GFR calc non Af Amer 68 (*) >90 (mL/min)   GFR calc Af Amer 79 (*) >90 (mL/min)  CBC      Component Value Range   WBC 8.1  4.0 - 10.5 (K/uL)   RBC 4.88  3.87 - 5.11 (MIL/uL)    Hemoglobin 14.2  12.0 - 15.0 (g/dL)   HCT 04.5  40.9 - 81.1 (%)   MCV 86.3  78.0 - 100.0 (fL)  MCH 29.1  26.0 - 34.0 (pg)   MCHC 33.7  30.0 - 36.0 (g/dL)   RDW 11.9  14.7 - 82.9 (%)   Platelets 298  150 - 400 (K/uL)  DIFFERENTIAL      Component Value Range   Neutrophils Relative 76  43 - 77 (%)   Neutro Abs 6.2  1.7 - 7.7 (K/uL)   Lymphocytes Relative 16  12 - 46 (%)   Lymphs Abs 1.3  0.7 - 4.0 (K/uL)   Monocytes Relative 8  3 - 12 (%)   Monocytes Absolute 0.6  0.1 - 1.0 (K/uL)   Eosinophils Relative 1  0 - 5 (%)   Eosinophils Absolute 0.1  0.0 - 0.7 (K/uL)   Basophils Relative 0  0 - 1 (%)   Basophils Absolute 0.0  0.0 - 0.1 (K/uL)  TROPONIN I      Component Value Range   Troponin I <0.30  <0.30 (ng/mL)  GLUCOSE, CAPILLARY      Component Value Range   Glucose-Capillary 152 (*) 70 - 99 (mg/dL)  URINE MICROSCOPIC-ADD ON      Component Value Range   Squamous Epithelial / LPF FEW (*) RARE    WBC, UA 3-6  <3 (WBC/hpf)   Bacteria, UA FEW (*) RARE    Mm Digital Screening  04/21/2012  DG SCREEN MAMMOGRAM BILATERAL Bilateral CC and MLO view(s) were taken.  DIGITAL SCREENING MAMMOGRAM WITH CAD: There are scattered fibroglandular densities.  No masses or malignant type calcifications are  identified.  Compared with prior studies.  Images were processed with CAD.  IMPRESSION: No specific mammographic evidence of malignancy.  Next screening mammogram is recommended in one  year.  A result letter of this screening mammogram will be mailed directly to the patient.  ASSESSMENT: Negative - BI-RADS 1  Screening mammogram in 1 year. ,     No diagnosis found. 4:25 PM feels improved after treatment with intravenous saline   MDM  Patient mildly dehydrated Plan encourage by mouth fluids Keep scheduled appointment with Dr.Robson in 2 days Diagnosis #1 dehydration #2 hyperglycemia #3 hypertension      Doug Sou, MD 04/24/12 1631

## 2012-04-24 NOTE — ED Notes (Signed)
Pt states she has a hx of COPD and diabetes. Yesterday felt "washed out". Ate and laid around all day. This a.m. Has had decreased energy and appetite. Was late taking BP meds and BP was up this a.m. as well. Denies other s/s.

## 2012-05-07 ENCOUNTER — Other Ambulatory Visit: Payer: Self-pay | Admitting: Internal Medicine

## 2012-06-07 ENCOUNTER — Telehealth: Payer: Self-pay | Admitting: Internal Medicine

## 2012-06-07 MED ORDER — ALBUTEROL SULFATE HFA 108 (90 BASE) MCG/ACT IN AERS: 2.0000 | INHALATION_SPRAY | Freq: Four times a day (QID) | RESPIRATORY_TRACT | Status: DC | PRN

## 2012-06-07 NOTE — Telephone Encounter (Signed)
Pt given 1 sample of spiriva per Mindy.  Jeanice Lim has paperwork.   Antionette Fairy

## 2012-06-07 NOTE — Telephone Encounter (Signed)
RX for albuterol has been sent to the pharmacy--forms has been placed on cdy cart for signature to send over to [employer]. Please advise Dr. Maple Hudson thanks

## 2012-06-08 NOTE — Telephone Encounter (Signed)
Holly, was this msg taken in error?

## 2012-06-08 NOTE — Telephone Encounter (Signed)
Error.  Duplicate message.  Holly D Pryor °- °

## 2012-06-14 NOTE — Telephone Encounter (Signed)
[name], do you know if these patient assistance forms have been taken care of?  Thanks.

## 2012-06-14 NOTE — Telephone Encounter (Signed)
No, the forms were placed on CDY's cart to sign on 05-29-12

## 2012-06-14 NOTE — Telephone Encounter (Signed)
I have not seen any paper work on this patient-did he mail back his self or drop them off here?

## 2012-06-17 NOTE — Telephone Encounter (Signed)
I have found the papers on Jennifer Duran's cart; placed on top of stack and requested they be filled out ASAP.

## 2012-06-21 NOTE — Telephone Encounter (Signed)
CY has signed the paperwork-faxed back today and will await an approval/denial from company. Please inform patient of this and I will keep her updated asap. Thanks.

## 2012-06-21 NOTE — Telephone Encounter (Signed)
Pt aware and will forward back to [facility]

## 2012-06-24 ENCOUNTER — Encounter: Payer: Self-pay | Admitting: Internal Medicine

## 2012-06-24 ENCOUNTER — Ambulatory Visit (INDEPENDENT_AMBULATORY_CARE_PROVIDER_SITE_OTHER): Payer: PRIVATE HEALTH INSURANCE | Admitting: Internal Medicine

## 2012-06-24 ENCOUNTER — Ambulatory Visit (INDEPENDENT_AMBULATORY_CARE_PROVIDER_SITE_OTHER)
Admission: RE | Admit: 2012-06-24 | Discharge: 2012-06-24 | Disposition: A | Discharge status: Home / Self Care | Payer: PRIVATE HEALTH INSURANCE | Source: Ambulatory Visit | Attending: Internal Medicine | Admitting: Internal Medicine

## 2012-06-24 VITALS — BP 102/50 | HR 101 | Ht 63.0 in | Wt 133.2 lb

## 2012-06-24 DIAGNOSIS — J4489 Other specified chronic obstructive pulmonary disease: Secondary | ICD-10-CM

## 2012-06-24 DIAGNOSIS — J449 Chronic obstructive pulmonary disease, unspecified: Secondary | ICD-10-CM

## 2012-06-24 NOTE — Patient Instructions (Addendum)
Sample spiriva  Order CXR- dx COPD  Consider trying an otc saline nasal gel as much and as often as needed for dry nose.

## 2012-06-24 NOTE — Progress Notes (Signed)
Quick Note:  Pt aware of results. ______ 

## 2012-06-24 NOTE — Progress Notes (Signed)
Patient ID: Jennifer Duran, female    DOB: 04-04-1929, 78 y.o.   MRN: [id number]  HPI 06/24/11- COPD complicated by DM.   Husband and daughter here Last here December 20, 2010- reviewed several labs that visit.  She feels she is doing pretty well. No acute problems since last here. Has stayed in a lot to avoid the Spring pollen and the recent poor air quality and heat. Remains on continuous oxygen at 2 L/M [employer]. Diabetic neuropathic paresthesias can disturb sleep some. She and family wanted to discuss portable concentrator or liquid oxygen as alternative to her portable tanks.   12/26/11- 33 yoF  Former smoker, followed for COPD complicated by DM, HBP. Here w/ husband. Had flu shot. Gradually aware of it easier dyspnea with exertion over the last 6 months. No dramatic events and no recent colds. She does not cough. Has noted a little palpitation at times without chest pain or syncope. She is now using a portable oxygen concentrator-battery lasts for hours-set on 3 L.  06/24/12- 82 yoF  Former smoker, followed for COPD complicated by DM, HBP. Here w/ husband. Doing well overall; unless heat then has to stay in doors as much as possible. She very much likes her portable oxygen concentrator 2 L per minute. When humidity is high she switches to 3 L. Oxygen flow is causing dry epistaxis COPD assessment test (CAT) score 14/40 Finances are tight for medication cost.  Review of Systems- see HPI Constitutional:   No-   weight loss, night sweats, fevers, chills, fatigue, lassitude. HEENT:   No-  headaches, difficulty swallowing, tooth/dental problems, sore throat,       No-  sneezing, itching, ear ache, nasal congestion, post nasal drip,  CV:  No-   chest pain, orthopnea, PND, swelling in lower extremities, anasarca, dizziness, palpitations Resp: +  shortness of breath with exertion, not at rest.              No-   productive cough,  No non-productive cough,  No- coughing up of blood.    No-   change in color of mucus.  No- wheezing.   Skin: No-   rash or lesions. GI:  No-   heartburn, indigestion, abdominal pain, nausea, vomiting, GU:  MS:  No-   joint pain or swelling.   Neuro-     nothing unusual Psych:  No- change in mood or affect. No depression or anxiety.  No memory loss.  Objective:  General- Alert, Oriented, Affect-appropriate, Distress- none acute, 3l by her portable concentrator- sat 95% at rest Skin- rash-none, lesions- none, excoriation- none. Color  looks better Lymphadenopathy- none Head- atraumatic            Eyes- Gross vision intact, PERRLA, conjunctivae clear secretions            Ears- Hearing, canals-normal            Nose- Clear, no-Septal dev, mucus, polyps, erosion, perforation             Throat- Mallampati II , mucosa clear , drainage- none, tonsils- atrophic Neck- flexible , trachea midline, no stridor , thyroid nl, carotid no bruit Chest - symmetrical excursion , unlabored           Heart/CV- RRR , no murmur , no gallop  , no rub, nl s1 s2                           -  JVD- none , edema- none, stasis changes- none, varices- none           Lung- clear to P&A- very distant, wheeze- none, cough- none , dullness-none, rub- none           Chest wall-  Abd-  Br/ Gen/ Rectal- Not done, not indicated Extrem- cyanosis- none, clubbing, none, atrophy- none, rolling walker. Neuro- grossly intact to observation

## 2012-06-25 NOTE — Telephone Encounter (Signed)
Called the patient assistance program-pt checked the wrong box stating that she had private insurance and she does not; Company stated that we the office need to fax over an appeal letter stating the box was checked wrong and refax the paperwork back so they can make a decision.

## 2012-06-27 NOTE — Assessment & Plan Note (Signed)
Severe COPD, oxygen dependent. Finances are a problem. Oxygen is drying her nose and I suggested saline nasal gel. Plan-sample Spiriva while we work on [employer] assistance. Chest x-ray

## 2012-06-30 NOTE — Telephone Encounter (Signed)
Pt stated that she would like to speak w/ [name].  Pt stated that she will drop off paperwork on Friday for her spiriva.  Pt stated she will be gone most of the afternoon & can be reached after 4:00 pm at [phone].  Antionette Fairy

## 2012-07-05 ENCOUNTER — Encounter: Payer: Self-pay | Admitting: *Deleted

## 2012-07-08 NOTE — Telephone Encounter (Signed)
Letter has been faxed back to company. Will sign off on message.

## 2012-07-12 ENCOUNTER — Telehealth: Payer: Self-pay | Admitting: Internal Medicine

## 2012-07-12 MED ORDER — TIOTROPIUM BROMIDE MONOHYDRATE 18 MCG IN CAPS: 18.0000 ug | ORAL_CAPSULE | Freq: Every day | RESPIRATORY_TRACT | Status: DC

## 2012-07-12 NOTE — Telephone Encounter (Signed)
ok 

## 2012-07-12 NOTE — Telephone Encounter (Signed)
Called, spoke with [facility] with [employer] Pt Assistance for Spiriva.  States this is in the appeals process but in order for this to proceed, Alan Mulder will need a 90 day spiriva rx faxed to her by 2 pm central time tomorrow.  If she does not receive this by then, the appeals process will not be completed.  Dr. Sherene Sires, Dr. Maple Hudson is on vacation.  Will you sign spiriva rx?

## 2012-07-12 NOTE — Telephone Encounter (Signed)
rx printed and signed by Dr. Sherene Sires.  I have faxed this back to Konica's attn at Fax # provided above.  Konica aware.

## 2012-07-19 ENCOUNTER — Telehealth: Payer: Self-pay | Admitting: Internal Medicine

## 2012-07-19 MED ORDER — TIOTROPIUM BROMIDE MONOHYDRATE 18 MCG IN CAPS: 18.0000 ug | ORAL_CAPSULE | Freq: Every day | RESPIRATORY_TRACT | Status: DC

## 2012-07-19 NOTE — Telephone Encounter (Signed)
rx printed for spiriva and papers on cy desk to sign .

## 2012-07-19 NOTE — Telephone Encounter (Signed)
Patient brought in pt assistance forms for Spiriva. I advised that Nicholos Johns give the forms to Melbourne Abts since she is working with [employer] this week and also gave Nicholos Johns to boxed of Spiriva to give to the pt in the meantime.

## 2012-07-19 NOTE — Telephone Encounter (Signed)
Pt adds: she originally checked "private prescription drugs in error- copy of  ins. card is included with papers to be re-faxed to [employer]. Hazel Sams

## 2012-11-17 ENCOUNTER — Other Ambulatory Visit (HOSPITAL_BASED_OUTPATIENT_CLINIC_OR_DEPARTMENT_OTHER): Payer: Self-pay | Admitting: Internal Medicine

## 2012-11-17 ENCOUNTER — Encounter (INDEPENDENT_AMBULATORY_CARE_PROVIDER_SITE_OTHER): Payer: PRIVATE HEALTH INSURANCE | Admitting: Ophthalmology

## 2012-11-17 ENCOUNTER — Ambulatory Visit
Admission: RE | Admit: 2012-11-17 | Discharge: 2012-11-17 | Disposition: A | Discharge status: Home / Self Care | Payer: PRIVATE HEALTH INSURANCE | Source: Ambulatory Visit | Attending: Internal Medicine | Admitting: Internal Medicine

## 2012-11-17 DIAGNOSIS — R05 Cough: Secondary | ICD-10-CM

## 2012-11-19 ENCOUNTER — Encounter (INDEPENDENT_AMBULATORY_CARE_PROVIDER_SITE_OTHER): Payer: PRIVATE HEALTH INSURANCE | Admitting: Ophthalmology

## 2012-11-19 DIAGNOSIS — H35039 Hypertensive retinopathy, unspecified eye: Secondary | ICD-10-CM

## 2012-11-19 DIAGNOSIS — I10 Essential (primary) hypertension: Secondary | ICD-10-CM

## 2012-11-19 DIAGNOSIS — E11319 Type 2 diabetes mellitus with unspecified diabetic retinopathy without macular edema: Secondary | ICD-10-CM

## 2012-11-19 DIAGNOSIS — H43819 Vitreous degeneration, unspecified eye: Secondary | ICD-10-CM

## 2012-11-19 DIAGNOSIS — E1139 Type 2 diabetes mellitus with other diabetic ophthalmic complication: Secondary | ICD-10-CM

## 2012-11-29 ENCOUNTER — Other Ambulatory Visit (HOSPITAL_BASED_OUTPATIENT_CLINIC_OR_DEPARTMENT_OTHER): Payer: Self-pay | Admitting: Internal Medicine

## 2012-11-29 DIAGNOSIS — R911 Solitary pulmonary nodule: Secondary | ICD-10-CM

## 2012-12-02 ENCOUNTER — Ambulatory Visit
Admission: RE | Admit: 2012-12-02 | Discharge: 2012-12-02 | Disposition: A | Discharge status: Home / Self Care | Payer: PRIVATE HEALTH INSURANCE | Source: Ambulatory Visit | Attending: Internal Medicine | Admitting: Internal Medicine

## 2012-12-02 DIAGNOSIS — R911 Solitary pulmonary nodule: Secondary | ICD-10-CM

## 2012-12-02 MED ORDER — IOHEXOL 300 MG/ML  SOLN
75.0000 mL | Freq: Once | INTRAMUSCULAR | Status: AC | PRN
  Administered 2012-12-02: 75 mL via INTRAVENOUS

## 2012-12-24 ENCOUNTER — Encounter: Payer: Self-pay | Admitting: Internal Medicine

## 2012-12-24 ENCOUNTER — Ambulatory Visit (INDEPENDENT_AMBULATORY_CARE_PROVIDER_SITE_OTHER): Payer: PRIVATE HEALTH INSURANCE | Admitting: Internal Medicine

## 2012-12-24 VITALS — BP 100/40 | HR 92 | Ht 62.0 in | Wt 135.0 lb

## 2012-12-24 DIAGNOSIS — J4489 Other specified chronic obstructive pulmonary disease: Secondary | ICD-10-CM

## 2012-12-24 DIAGNOSIS — J449 Chronic obstructive pulmonary disease, unspecified: Secondary | ICD-10-CM

## 2012-12-24 DIAGNOSIS — R911 Solitary pulmonary nodule: Secondary | ICD-10-CM

## 2012-12-24 DIAGNOSIS — R918 Other nonspecific abnormal finding of lung field: Secondary | ICD-10-CM

## 2012-12-24 NOTE — Progress Notes (Signed)
Patient ID: Jennifer Duran, female    DOB: [birth year], 78 y.o.   MRN: [id number]  HPI 06/24/11- COPD complicated by DM.   Husband and daughter here Last here December 20, 2010- reviewed several labs that visit.  She feels she is doing pretty well. No acute problems since last here. Has stayed in a lot to avoid the Spring pollen and the recent poor air quality and heat. Remains on continuous oxygen at 2 L/M [employer]. Diabetic neuropathic paresthesias can disturb sleep some. She and family wanted to discuss portable concentrator or liquid oxygen as alternative to her portable tanks.   12/26/11- 51 yoF  Former smoker, followed for COPD complicated by DM, HBP. Here w/ husband. Had flu shot. Gradually aware of it easier dyspnea with exertion over the last 6 months. No dramatic events and no recent colds. She does not cough. Has noted a little palpitation at times without chest pain or syncope. She is now using a portable oxygen concentrator-battery lasts for hours-set on 3 L.  06/24/12- 82 yoF  Former smoker, followed for COPD complicated by DM, HBP. Here w/ husband. Doing well overall; unless heat then has to stay in doors as much as possible. She very much likes her portable oxygen concentrator 2 L per minute. When humidity is high she switches to 3 L. Oxygen flow is causing dry epistaxis COPD assessment test (CAT) score 14/40 Finances are tight for medication cost.  12/24/12- 89 yoF  Former smoker, followed for COPD complicated by DM, HBP.      Widowed 6 month follow up.  O2 sat 86% on 2 lpm pulsed upon arrival to exam room.  Reports breathing has gradually worsened since the summer.  Having increased DOE and wheezing at times.  No chest tightness, chest pain, or cough at this time. Breathing got worse when her husband died. She seems to recognize components of deconditioning and grief/depression. Her son and daughter are able to look in and help some. I asked her to consider no longer living  alone. CT chest 12/02/12- IMPRESSION:  1. The area of concern at the right lung base probably represents  superimposed vascular and osseous shadows.  2. However, there are If the patient is at low risk for  bronchogenic carcinoma, follow-up chest CT at 12 months is  recommended. This recommendation follows the consensus statement:  Guidelines for Management of Small Pulmonary Nodules Detected on CT  Scans: A Statement from the Fleischner Society as published in  Radiology 2005; 237:395-400.  3. Severe emphysema.  4. Prominent atherosclerosis.  Original Report Authenticated By: Gaylyn Rong, M.D.   Review of Systems- see HPI Constitutional:   No-   weight loss, night sweats, fevers, chills, fatigue, lassitude. HEENT:   No-  headaches, difficulty swallowing, tooth/dental problems, sore throat,       No-  sneezing, itching, ear ache, nasal congestion, post nasal drip,  CV:  No-   chest pain, orthopnea, PND, swelling in lower extremities, anasarca, dizziness, palpitations Resp: +  shortness of breath with exertion, not at rest.              No-   productive cough,  No non-productive cough,  No- coughing up of blood.              No-   change in color of mucus.  No- wheezing.   Skin: No-   rash or lesions. GI:  No-   heartburn, indigestion, abdominal pain, nausea, vomiting, GU:  MS:  No-   joint pain or swelling.   Neuro-     nothing unusual Psych:  No- change in mood or affect. + depression or anxiety.  No memory loss.  Objective:  BP 100/40  Pulse 92  Ht 5\' 2"  (1.575 m)  Wt 135 lb (61.236 kg)  BMI 24.69 kg/m2  SpO2 93% General- Alert, Oriented, Affect-appropriate, Distress- none acute, 2l by her portable concentrator Skin- rash-none, lesions- none, excoriation- none. Color  looks better Lymphadenopathy- none Head- atraumatic            Eyes- Gross vision intact, PERRLA, conjunctivae clear secretions            Ears- Hearing, canals-normal            Nose- Clear, no-Septal  dev, mucus, polyps, erosion, perforation             Throat- Mallampati II , mucosa clear , drainage- none, tonsils- atrophic Neck- flexible , trachea midline, no stridor , thyroid nl, carotid no bruit Chest - symmetrical excursion , unlabored           Heart/CV- RRR , no murmur , no gallop  , no rub, nl s1 s2                           - JVD- none , edema- none, stasis changes- none, varices- none           Lung- +clear to P&A- very distant, wheeze- none, cough- none , dullness-none, rub- none           Chest wall-  Abd-  Br/ Gen/ Rectal- Not done, not indicated Extrem- cyanosis- none, clubbing, none, atrophy- none, rolling walker. Neuro- grossly intact to observation

## 2012-12-24 NOTE — Patient Instructions (Addendum)
Order- future CT chest no contrast   In 6 months before next visit  Suggest you run O2 at 3L or 4 L with any exertion.  We can continue present medicines

## 2013-01-05 DIAGNOSIS — R918 Other nonspecific abnormal finding of lung field: Secondary | ICD-10-CM | POA: Insufficient documentation

## 2013-01-05 NOTE — Assessment & Plan Note (Addendum)
COPD with some associated dyspnea on exertion. She is not going to get stronger over this next year. I recommended she talk with her daughters about living with  other people who can conveniently help her.

## 2013-01-06 NOTE — Assessment & Plan Note (Signed)
Plan-followup chest CT in 6 months without contrast

## 2013-01-10 ENCOUNTER — Encounter (HOSPITAL_BASED_OUTPATIENT_CLINIC_OR_DEPARTMENT_OTHER): Payer: Self-pay

## 2013-01-10 ENCOUNTER — Emergency Department (HOSPITAL_BASED_OUTPATIENT_CLINIC_OR_DEPARTMENT_OTHER)
Admission: EM | Admit: 2013-01-10 | Discharge: 2013-01-10 | Disposition: A | Discharge status: Home / Self Care | Payer: PRIVATE HEALTH INSURANCE | Attending: Emergency Medicine | Admitting: Emergency Medicine

## 2013-01-10 ENCOUNTER — Emergency Department (HOSPITAL_BASED_OUTPATIENT_CLINIC_OR_DEPARTMENT_OTHER): Payer: PRIVATE HEALTH INSURANCE

## 2013-01-10 DIAGNOSIS — Z79899 Other long term (current) drug therapy: Secondary | ICD-10-CM | POA: Insufficient documentation

## 2013-01-10 DIAGNOSIS — R6883 Chills (without fever): Secondary | ICD-10-CM | POA: Insufficient documentation

## 2013-01-10 DIAGNOSIS — Z794 Long term (current) use of insulin: Secondary | ICD-10-CM | POA: Insufficient documentation

## 2013-01-10 DIAGNOSIS — R05 Cough: Secondary | ICD-10-CM | POA: Insufficient documentation

## 2013-01-10 DIAGNOSIS — E119 Type 2 diabetes mellitus without complications: Secondary | ICD-10-CM | POA: Insufficient documentation

## 2013-01-10 DIAGNOSIS — Z7902 Long term (current) use of antithrombotics/antiplatelets: Secondary | ICD-10-CM | POA: Insufficient documentation

## 2013-01-10 DIAGNOSIS — R059 Cough, unspecified: Secondary | ICD-10-CM | POA: Insufficient documentation

## 2013-01-10 DIAGNOSIS — J029 Acute pharyngitis, unspecified: Secondary | ICD-10-CM | POA: Insufficient documentation

## 2013-01-10 DIAGNOSIS — Z87891 Personal history of nicotine dependence: Secondary | ICD-10-CM | POA: Insufficient documentation

## 2013-01-10 DIAGNOSIS — M810 Age-related osteoporosis without current pathological fracture: Secondary | ICD-10-CM | POA: Insufficient documentation

## 2013-01-10 DIAGNOSIS — Z90710 Acquired absence of both cervix and uterus: Secondary | ICD-10-CM | POA: Insufficient documentation

## 2013-01-10 DIAGNOSIS — I10 Essential (primary) hypertension: Secondary | ICD-10-CM | POA: Insufficient documentation

## 2013-01-10 DIAGNOSIS — Z8709 Personal history of other diseases of the respiratory system: Secondary | ICD-10-CM | POA: Insufficient documentation

## 2013-01-10 DIAGNOSIS — J3489 Other specified disorders of nose and nasal sinuses: Secondary | ICD-10-CM | POA: Insufficient documentation

## 2013-01-10 DIAGNOSIS — Z8639 Personal history of other endocrine, nutritional and metabolic disease: Secondary | ICD-10-CM | POA: Insufficient documentation

## 2013-01-10 DIAGNOSIS — IMO0001 Reserved for inherently not codable concepts without codable children: Secondary | ICD-10-CM | POA: Insufficient documentation

## 2013-01-10 DIAGNOSIS — Z862 Personal history of diseases of the blood and blood-forming organs and certain disorders involving the immune mechanism: Secondary | ICD-10-CM | POA: Insufficient documentation

## 2013-01-10 DIAGNOSIS — J449 Chronic obstructive pulmonary disease, unspecified: Secondary | ICD-10-CM | POA: Insufficient documentation

## 2013-01-10 DIAGNOSIS — J4489 Other specified chronic obstructive pulmonary disease: Secondary | ICD-10-CM | POA: Insufficient documentation

## 2013-01-10 LAB — CBC WITH DIFFERENTIAL/PLATELET
Basophils Absolute: 0 10*3/uL (ref 0.0–0.1)
Basophils Relative: 0 % (ref 0–1)
Eosinophils Absolute: 0.2 10*3/uL (ref 0.0–0.7)
Eosinophils Relative: 3 % (ref 0–5)
HCT: 42.3 % (ref 36.0–46.0)
MCHC: 32.9 g/dL (ref 30.0–36.0)
Monocytes Absolute: 0.5 10*3/uL (ref 0.1–1.0)
Neutro Abs: 5.1 10*3/uL (ref 1.7–7.7)
RDW: 13.7 % (ref 11.5–15.5)

## 2013-01-10 LAB — URINALYSIS, ROUTINE W REFLEX MICROSCOPIC
Glucose, UA: NEGATIVE mg/dL
Ketones, ur: 15 mg/dL — AB
Leukocytes, UA: NEGATIVE
Nitrite: NEGATIVE
Protein, ur: NEGATIVE mg/dL

## 2013-01-10 LAB — INFLUENZA PANEL BY PCR (TYPE A & B)
H1N1 flu by pcr: DETECTED — AB
Influenza A By PCR: POSITIVE — AB

## 2013-01-10 LAB — TROPONIN I: Troponin I: <0.3 ng/mL (ref <0.30)

## 2013-01-10 LAB — COMPREHENSIVE METABOLIC PANEL
ALT: 13 U/L (ref 0–35)
Alkaline Phosphatase: 67 U/L (ref 39–117)
BUN: 21 mg/dL (ref 6–23)
Chloride: 99 mEq/L (ref 96–112)
GFR calc Af Amer: 53 mL/min — ABNORMAL LOW (ref >90)
GFR calc non Af Amer: 46 mL/min — ABNORMAL LOW (ref >90)
Glucose, Bld: 115 mg/dL — ABNORMAL HIGH (ref 70–99)
Total Bilirubin: 0.4 mg/dL (ref 0.3–1.2)

## 2013-01-10 MED ORDER — HYDROCODONE-ACETAMINOPHEN 5-325 MG PO TABS
1.0000 | ORAL_TABLET | Freq: Four times a day (QID) | ORAL | Status: DC | PRN
Start: 2013-01-10 — End: 2013-04-08

## 2013-01-10 MED ORDER — SODIUM CHLORIDE 0.9 % IV BOLUS (SEPSIS)
500.0000 mL | Freq: Once | INTRAVENOUS | Status: AC
  Administered 2013-01-10: 14:00:00 via INTRAVENOUS

## 2013-01-10 MED ORDER — SODIUM CHLORIDE 0.9 % IV BOLUS (SEPSIS)
500.0000 mL | Freq: Once | INTRAVENOUS | Status: AC
  Administered 2013-01-10: 999 mL via INTRAVENOUS

## 2013-01-10 MED ORDER — ONDANSETRON HCL 4 MG/2ML IJ SOLN
4.0000 mg | Freq: Once | INTRAMUSCULAR | Status: AC
  Administered 2013-01-10: 4 mg via INTRAVENOUS
  Filled 2013-01-10: qty 2

## 2013-01-10 MED ORDER — ONDANSETRON HCL 4 MG PO TABS: 4.0000 mg | ORAL_TABLET | Freq: Three times a day (TID) | ORAL | Status: DC | PRN

## 2013-01-10 MED ORDER — HYDROCODONE-ACETAMINOPHEN 5-325 MG PO TABS
1.0000 | ORAL_TABLET | Freq: Once | ORAL | Status: DC
  Filled 2013-01-10: qty 1

## 2013-01-10 NOTE — ED Notes (Signed)
Pt denies need for pain med at this time.

## 2013-01-10 NOTE — ED Notes (Signed)
PTAR report-pt with flu like s/s x 7 days-was seen by PCP last week started on Augmentin for URI-c/o n/v started yesterday

## 2013-01-10 NOTE — ED Notes (Signed)
Pt and daughter report pt has been having URI s/s-congestion, cough x 1 week-was seen by PCP-started on Augmentin-pt now has n/v/d and has stopped abx-pt is on O2 3LNC at all times-NAD at this time

## 2013-01-10 NOTE — ED Provider Notes (Signed)
History     CSN: [id number]  Arrival date & time 01/10/13  1128   First MD Initiated Contact with Patient 01/10/13 1140      Chief Complaint  Patient presents with  . Emesis    (Consider location/radiation/quality/duration/timing/severity/associated sxs/prior treatment) Patient is a 79 y.o. female presenting with cough. The history is provided by the patient.  Cough This is a new problem. The current episode started more than 1 week ago. The problem occurs hourly. The problem has been gradually worsening. The cough is productive of sputum. There has been no fever. Associated symptoms include chills, rhinorrhea, sore throat and myalgias. Pertinent negatives include no chest pain, no sweats, no ear congestion, no ear pain, no headaches, no shortness of breath and no wheezing. Treatments tried: Given Augmentin  by her primary care provider 5-6 days ago for suspected sinusitis  The treatment provided no relief. Smoker: Former. Her past medical history is significant for COPD.    Past Medical History  Diagnosis Date  . Allergic rhinitis, cause unspecified   . Unspecified essential hypertension   . Cor pulmonale   . COPD (chronic obstructive pulmonary disease)   . Type II or unspecified type diabetes mellitus without mention of complication, not stated as uncontrolled   . Osteoporosis   . Hashimoto's disease     Past Surgical History  Procedure Date  . Total abdominal hysterectomy   . Cataracts   . Rotator cuff repair     No family history on file.  History  Substance Use Topics  . Smoking status: Former Smoker -- 2.0 packs/day for 53 years  . Smokeless tobacco: Never Used  . Alcohol Use: Not on file    OB History    Grav Para Term Preterm Abortions TAB SAB Ect Mult Living                  Review of Systems  Constitutional: Positive for chills.  HENT: Positive for sore throat and rhinorrhea. Negative for ear pain.   Respiratory: Positive for cough. Negative for  shortness of breath and wheezing.   Cardiovascular: Negative for chest pain.  Musculoskeletal: Positive for myalgias.  Neurological: Negative for headaches.  All other systems reviewed and are negative.    Allergies  Celecoxib; Codeine; Metronidazole; Pregabalin; Pregabalin; Propranolol hcl; Rofecoxib; and Iodine  Home Medications   Current Outpatient Rx  Name  Route  Sig  Dispense  Refill  . AUGMENTIN PO   Oral   Take by mouth.         Marland Kitchen PROBIOTIC DAILY PO   Oral   Take by mouth.         . ADVAIR DISKUS 250-50 MCG/DOSE IN AEPB      INHALE 1 PUFF INTO THE LUNGS 2 (TWO) TIMES DAILY.   60 each   1   . ALBUTEROL SULFATE HFA 108 (90 BASE) MCG/ACT IN AERS   Inhalation   Inhale 2 puffs into the lungs every 6 (six) hours as needed.   1 Inhaler   6   . ATORVASTATIN CALCIUM 20 MG PO TABS   Oral   Take 20 mg by mouth daily.         Marland Kitchen CITALOPRAM HYDROBROMIDE 10 MG PO TABS   Oral   Take 10 mg by mouth daily.         Marland Kitchen CLONAZEPAM 0.5 MG PO TABS   Oral   Take 0.5 mg by mouth. One at breakfast, one at lunch, and 2 at  bedtime         . CLOPIDOGREL BISULFATE 75 MG PO TABS   Oral   Take 75 mg by mouth daily.           . FUROSEMIDE 40 MG PO TABS   Oral   Take 40 mg by mouth daily.          Marland Kitchen GEMFIBROZIL 600 MG PO TABS   Oral   Take 600 mg by mouth daily.          . INSULIN ASPART 100 UNIT/ML [place] SOLN   Subcutaneous   Inject 5 Units into the skin 3 (three) times daily before meals.           . INSULIN GLARGINE 100 UNIT/ML [place] SOLN   Subcutaneous   Inject 10 Units into the skin at bedtime.           Marland Kitchen LISINOPRIL 10 MG PO TABS   Oral   Take 5 mg by mouth daily.          Marland Kitchen RALOXIFENE HCL 60 MG PO TABS   Oral   Take 60 mg by mouth daily.           Marland Kitchen TIOTROPIUM BROMIDE MONOHYDRATE 18 MCG IN CAPS   Inhalation   Place 1 capsule (18 mcg total) into inhaler and inhale daily.   90 capsule   3   . TRAMADOL HCL 50 MG PO TABS      Two by mouth  at bedtime            BP 180/58  Pulse 84  Temp 98 F (36.7 C) (Oral)  Resp 16  SpO2 100%  Physical Exam  Nursing note and vitals reviewed. Constitutional: She is oriented to person, place, and time. She appears well-developed and well-nourished. No distress.  HENT:  Head: Normocephalic and atraumatic.  Mouth/Throat: Oropharynx is clear and moist. No oropharyngeal exudate.  Eyes: EOM are normal. Pupils are equal, round, and reactive to light.  Neck: Normal range of motion. Neck supple.  Cardiovascular: Normal rate and regular rhythm.  Exam reveals no friction rub.   No murmur heard. Pulmonary/Chest: Effort normal and breath sounds normal. No respiratory distress. She has no wheezes. She has no rales.       On 3 L Nasal cannula. This is her home dose of oxygen.  Abdominal: Soft. She exhibits no distension. There is no tenderness. There is no rebound.  Musculoskeletal: Normal range of motion. She exhibits no edema.  Neurological: She is alert and oriented to person, place, and time.  Skin: She is not diaphoretic.    ED Course  Procedures (including critical care time)  Labs Reviewed  COMPREHENSIVE METABOLIC PANEL - Abnormal; Notable for the following:    Glucose, Bld 115 (*)     GFR calc non Af Amer 46 (*)     GFR calc Af Amer 53 (*)     All other components within normal limits  URINALYSIS, ROUTINE W REFLEX MICROSCOPIC - Abnormal; Notable for the following:    Ketones, ur 15 (*)     All other components within normal limits  CBC WITH DIFFERENTIAL  TROPONIN I  INFLUENZA PANEL BY PCR  URINE CULTURE   Dg Chest 2 View  01/10/2013  *RADIOLOGY REPORT*  Clinical Data: Cough, congestion, vomiting and diarrhea.  CHEST - 2 VIEW  Comparison: CT chest 12/02/2012 and chest radiograph 11/17/2012.  Findings: Trachea is midline.  Heart size normal.  Lungs are emphysematous.  Added density at the lung bases appears unchanged. No pleural fluid.  IMPRESSION: Emphysema without acute  finding.   Original Report Authenticated By: Leanna Battles, M.D.      1. Influenza-like illness     EKG: normal sinus rhythm, mild ST depression in lateral leads, worse than previous. No ST elevation. Normal axis, normal intervals.   MDM  Patient is an 79 year old female with history of COPD and diabetes. She presents with one week of worsening cough, myalgias. She did get a flu shot. She saw her primary care provider who gave her Augmentin for possible sinusitis roughly one week ago. She reports worsening despite the treatment. Continued cough, myalgias, chills. Anorexia since last night with one episode of vomiting. She did not take her into last night or this morning. She's afebrile here vital signs are stable. She is not tachypnea. She's not hypoxic, satting in the high 90s on 3 L nasal cannula which is her normal oxygen requirement. Denies any chest pain. She reports shortness of her breath on exertion at baseline, however is mildly worsened with her illness. EKG shows some mild ST depression is worse than her baseline depression in the lateral lead. I will send troponin, basic labs. I will check a chest x-ray. She reports great fatigue, was unable to get out of bed this morning. Patient will likely need admission. Chest x-ray is negative for acute pneumonia. Labs normal. I spoke with a hospitalist who states with normal labs, normal vitals, there is no acute need for admission. Patient states she is feeling a little better since arrival.  Ambulatory with mild amount of assistance with no drop in her sats on her home O2.  I spoke with her PCP, who is comfortable with plan to go home. He states he will have someone on his staff call tomorrow and check on her. He states he was not the person who prescribed her Augmentin.  I spoke with patient's daughter who agreed with plan of discharge. Daughter can make herself available for help at the patient's home. Stable for discharge.           Elwin Mocha, MD 01/11/13 [place]

## 2013-01-10 NOTE — ED Notes (Signed)
Message record to call back to [phone]**

## 2013-01-10 NOTE — ED Notes (Signed)
Called [facility] a second time --to page hospitalist

## 2013-01-11 LAB — URINE CULTURE: Colony Count: NO GROWTH

## 2013-01-11 NOTE — ED Provider Notes (Signed)
Medical screening examination/treatment/procedure(s) were performed by non-physician practitioner and as supervising physician I was immediately available for consultation/collaboration.   Rolan Bucco, MD 01/11/13 [phone]

## 2013-01-21 ENCOUNTER — Telehealth: Payer: Self-pay | Admitting: Internal Medicine

## 2013-01-21 NOTE — Telephone Encounter (Signed)
I spoke with pt and she stated her vascular doctor scheduled her for a procedure and wanted to know where it is located. I advised her she would need to call there office bc we have no idea what she is scheduled for. Nothing further was needed

## 2013-02-08 ENCOUNTER — Encounter: Payer: Self-pay | Admitting: Vascular Surgery

## 2013-02-09 ENCOUNTER — Ambulatory Visit: Payer: PRIVATE HEALTH INSURANCE | Admitting: Vascular Surgery

## 2013-02-22 ENCOUNTER — Encounter: Payer: Self-pay | Admitting: Vascular Surgery

## 2013-02-23 ENCOUNTER — Encounter: Payer: Self-pay | Admitting: Vascular Surgery

## 2013-02-23 ENCOUNTER — Encounter (INDEPENDENT_AMBULATORY_CARE_PROVIDER_SITE_OTHER): Payer: PRIVATE HEALTH INSURANCE | Admitting: *Deleted

## 2013-02-23 ENCOUNTER — Ambulatory Visit (INDEPENDENT_AMBULATORY_CARE_PROVIDER_SITE_OTHER): Payer: PRIVATE HEALTH INSURANCE | Admitting: Vascular Surgery

## 2013-02-23 VITALS — BP 122/47 | HR 95 | Resp 16 | Ht 62.0 in | Wt 134.0 lb

## 2013-02-23 DIAGNOSIS — I739 Peripheral vascular disease, unspecified: Secondary | ICD-10-CM

## 2013-02-23 NOTE — Progress Notes (Signed)
Vascular and Vein Specialist of [facility]  Patient name: Jennifer Duran MRN: [id number] DOB: 1929/06/23 Sex: female  REASON FOR VISIT: Follow up of peripheral vascular disease.  HPI: Jennifer Duran is a 79 y.o. female who I been following with peripheral vascular disease. She has stable claudication of both lower extremities which involves the calves bilaterally. Her symptoms are more significant on the left side. She experiences pain in her calves which is brought on by ambulation and relieved with rest. Because of her long issues her activity is fairly limited. She denies any history of rest pain or history of nonhealing ulcers.  She is on home O2.  Past Medical History  Diagnosis Date  . Allergic rhinitis, cause unspecified   . Unspecified essential hypertension   . Cor pulmonale   . COPD (chronic obstructive pulmonary disease)   . Type II or unspecified type diabetes mellitus without mention of complication, not stated as uncontrolled   . Osteoporosis   . Hashimoto's disease   . Peripheral vascular disease   . Hyperlipidemia     Family History  Problem Relation Age of Onset  . Cancer Mother     lung     SOCIAL HISTORY: History  Substance Use Topics  . Smoking status: Former Smoker -- 2.00 packs/day for 53 years  . Smokeless tobacco: Never Used  . Alcohol Use: No    Allergies  Allergen Reactions  . Celecoxib Swelling  . Codeine   . Metronidazole   . Pregabalin     REACTION: rash/hives  . Pregabalin Swelling  . Propranolol Hcl   . Rofecoxib   . Iodine Rash    Only if ingested    Current Outpatient Prescriptions  Medication Sig Dispense Refill  . ADVAIR DISKUS 250-50 MCG/DOSE AEPB INHALE 1 PUFF INTO THE LUNGS 2 (TWO) TIMES DAILY.  60 each  1  . albuterol (VENTOLIN HFA) 108 (90 BASE) MCG/ACT inhaler Inhale 2 puffs into the lungs every 6 (six) hours as needed.  1 Inhaler  6  . atorvastatin (LIPITOR) 20 MG tablet Take 20 mg by mouth daily.      . citalopram  (CELEXA) 10 MG tablet Take 10 mg by mouth continuous as needed.       . clonazePAM (KLONOPIN) 0.5 MG tablet Take 0.5 mg by mouth. One at breakfast, one at lunch, and 1 at bedtime      . clopidogrel (PLAVIX) 75 MG tablet Take 75 mg by mouth daily.        . furosemide (LASIX) 40 MG tablet Take 20 mg by mouth daily.       Marland Kitchen gemfibrozil (LOPID) 600 MG tablet Take 600 mg by mouth daily.       . insulin aspart (NOVOLOG) 100 UNIT/ML injection Inject 5 Units into the skin 3 (three) times daily before meals.        . insulin glargine (LANTUS) 100 UNIT/ML injection Inject 10 Units into the skin at bedtime.        Marland Kitchen lisinopril (PRINIVIL,ZESTRIL) 10 MG tablet Take 10 mg by mouth daily.       . raloxifene (EVISTA) 60 MG tablet Take 60 mg by mouth daily.        Marland Kitchen tiotropium (SPIRIVA HANDIHALER) 18 MCG inhalation capsule Place 1 capsule (18 mcg total) into inhaler and inhale daily.  90 capsule  3  . traMADol (ULTRAM) 50 MG tablet Two by mouth at bedtime       . Amoxicillin-Pot Clavulanate (AUGMENTIN PO) Take  by mouth.      Marland Kitchen HYDROcodone-acetaminophen (NORCO/VICODIN) 5-325 MG per tablet Take 1 tablet by mouth every 6 (six) hours as needed for pain.  12 tablet  0  . ondansetron (ZOFRAN) 4 MG tablet Take 1 tablet (4 mg total) by mouth every 8 (eight) hours as needed for nausea.  12 tablet  0  . Probiotic Product (PROBIOTIC DAILY PO) Take by mouth.       No current facility-administered medications for this visit.    REVIEW OF SYSTEMS: Arly.Keller ] denotes positive finding; [  ] denotes negative finding  CARDIOVASCULAR:  [ ]  chest pain   [ ]  chest pressure   [ ]  palpitations   [ ]  orthopnea   [ ]  dyspnea on exertion   Arly.Keller ] claudication   [ ]  rest pain   [ ]  DVT   [ ]  phlebitis PULMONARY:   [ ]  productive cough   [ ]  asthma   [ ]  wheezing NEUROLOGIC:   [ ]  weakness  [ ]  paresthesias  [ ]  aphasia  [ ]  amaurosis  [ ]  dizziness HEMATOLOGIC:   [ ]  bleeding problems   [ ]  clotting disorders MUSCULOSKELETAL:  [ ]  joint pain    [ ]  joint swelling [ ]  leg swelling GASTROINTESTINAL: [ ]   blood in stool  [ ]   hematemesis GENITOURINARY:  [ ]   dysuria  [ ]   hematuria PSYCHIATRIC:  [ ]  history of major depression INTEGUMENTARY:  [ ]  rashes  [ ]  ulcers CONSTITUTIONAL:  [ ]  fever   [ ]  chills  PHYSICAL EXAM: Filed Vitals:   02/23/13 1642  BP: 122/47  Pulse: 95  Resp: 16  Height: 5\' 2"  (1.575 m)  Weight: 134 lb (60.782 kg)  SpO2: 99%   Body mass index is 24.5 kg/(m^2). GENERAL: The patient is a well-nourished female, in no acute distress. The vital signs are documented above. CARDIOVASCULAR: There is a regular rate and rhythm. Do not detect carotid bruits. She has a palpable right femoral pulse. I cannot palpate a left femoral pulse. I cannot palpate popliteal or pedal pulses. Both feet appear adequately perfused. There is no significant lower extremity swelling. PULMONARY: There is good air exchange bilaterally without wheezing or rales. ABDOMEN: Soft and non-tender with normal pitched bowel sounds.  MUSCULOSKELETAL: There are no major deformities or cyanosis. NEUROLOGIC: No focal weakness or paresthesias are detected. SKIN: There are no ulcers or rashes noted. PSYCHIATRIC: The patient has a normal affect.  DATA:  I have independently interpreted her arterial Doppler study which shows triphasic Doppler signals in the right foot with an ABI 56%. On the left side she has a monophasic dorsalis pedis and posterior tibial signal with an ABI of 43%.  MEDICAL ISSUES:  Peripheral vascular disease This patient has stable infrainguinal arterial occlusive disease. She's had some drop in her ABI on the left however she's had no change in her symptoms. She would be at very high risk for any surgical intervention. I've encouraged her to stay as active as possible and I have ordered follow up ABIs for 1 year. I'll see her back at that time. She knows to call sooner if she has problems.   Ioane Bhola S [facility] [place] Beeper: [phone]

## 2013-02-23 NOTE — Assessment & Plan Note (Signed)
This patient has stable infrainguinal arterial occlusive disease. She's had some drop in her ABI on the left however she's had no change in her symptoms. She would be at very high risk for any surgical intervention. I've encouraged her to stay as active as possible and I have ordered follow up ABIs for 1 year. I'll see her back at that time. She knows to call sooner if she has problems.

## 2013-02-24 NOTE — Addendum Note (Signed)
Addended by: Sharee Pimple on: 02/24/2013 07:52 AM   Modules accepted: Orders

## 2013-03-18 ENCOUNTER — Other Ambulatory Visit: Payer: Self-pay | Admitting: *Deleted

## 2013-03-18 DIAGNOSIS — J449 Chronic obstructive pulmonary disease, unspecified: Secondary | ICD-10-CM

## 2013-03-18 DIAGNOSIS — J4489 Other specified chronic obstructive pulmonary disease: Secondary | ICD-10-CM

## 2013-03-18 DIAGNOSIS — E119 Type 2 diabetes mellitus without complications: Secondary | ICD-10-CM

## 2013-03-18 DIAGNOSIS — F411 Generalized anxiety disorder: Secondary | ICD-10-CM

## 2013-03-23 ENCOUNTER — Other Ambulatory Visit: Payer: Self-pay

## 2013-04-04 ENCOUNTER — Other Ambulatory Visit: Payer: Self-pay | Admitting: *Deleted

## 2013-04-04 MED ORDER — CLONAZEPAM 0.5 MG PO TABS: ORAL_TABLET | ORAL | Status: DC

## 2013-04-08 ENCOUNTER — Encounter: Payer: Self-pay | Admitting: Internal Medicine

## 2013-04-08 ENCOUNTER — Ambulatory Visit (INDEPENDENT_AMBULATORY_CARE_PROVIDER_SITE_OTHER): Payer: PRIVATE HEALTH INSURANCE | Admitting: Internal Medicine

## 2013-04-08 VITALS — BP 112/62 | HR 101 | Temp 97.6°F | Resp 16 | Ht 62.0 in | Wt 134.4 lb

## 2013-04-08 DIAGNOSIS — J4489 Other specified chronic obstructive pulmonary disease: Secondary | ICD-10-CM

## 2013-04-08 DIAGNOSIS — J449 Chronic obstructive pulmonary disease, unspecified: Secondary | ICD-10-CM

## 2013-04-08 DIAGNOSIS — I10 Essential (primary) hypertension: Secondary | ICD-10-CM

## 2013-04-08 DIAGNOSIS — R21 Rash and other nonspecific skin eruption: Secondary | ICD-10-CM

## 2013-04-08 DIAGNOSIS — E119 Type 2 diabetes mellitus without complications: Secondary | ICD-10-CM

## 2013-04-08 DIAGNOSIS — J309 Allergic rhinitis, unspecified: Secondary | ICD-10-CM

## 2013-04-08 MED ORDER — CLOTRIMAZOLE-BETAMETHASONE 1-0.05 % EX LOTN: TOPICAL_LOTION | Freq: Two times a day (BID) | CUTANEOUS | Status: DC

## 2013-04-08 NOTE — Progress Notes (Signed)
Patient ID: SAMANATHA BRAMMER, female   DOB: [birth year], 79 y.o.   MRN: [id number] Code Status: DNR  Allergies  Allergen Reactions  . Celecoxib Swelling  . Codeine   . Metronidazole   . Pregabalin     REACTION: rash/hives  . Pregabalin Swelling  . Propranolol Hcl   . Rofecoxib   . Iodine Rash    Only if ingested    Chief Complaint  Patient presents with  . Medical Managment of Chronic Issues    HPI: Patient is a 79 y.o. Caucasian female seen in the office today for management of her chronic conditions.  Is itchy and it's worse than ever lately.  Breathing is stable right night.  Sleep improved.  Appetite and mood are better.  Soon to be living with daughter.    Review of Systems:  Review of Systems  Constitutional: Positive for malaise/fatigue. Negative for fever, chills and weight loss.  HENT: Negative for congestion.   Eyes: Negative for blurred vision.  Respiratory: Positive for cough and shortness of breath. Negative for sputum production.        Chronic, no acute change  Cardiovascular: Negative for chest pain.  Gastrointestinal: Negative for heartburn, abdominal pain and constipation.  Genitourinary: Negative for dysuria.  Musculoskeletal: Negative for falls.  Skin: Positive for itching and rash.  Neurological: Positive for weakness. Negative for dizziness and headaches.  Psychiatric/Behavioral: Positive for depression.       Improved     Past Medical History  Diagnosis Date  . Allergic rhinitis, cause unspecified   . Unspecified essential hypertension   . Cor pulmonale   . COPD (chronic obstructive pulmonary disease)   . Type II or unspecified type diabetes mellitus without mention of complication, not stated as uncontrolled   . Osteoporosis   . Hashimoto's disease   . Peripheral vascular disease   . Hyperlipidemia    Past Surgical History  Procedure Laterality Date  . Total abdominal hysterectomy    . Cataracts    . Rotator cuff repair     Social  History:   reports that she has quit smoking. She has never used smokeless tobacco. She reports that she does not drink alcohol or use illicit drugs.  Family History  Problem Relation Age of Onset  . Cancer Mother     lung     Medications: Patient's Medications  New Prescriptions   CLOTRIMAZOLE-BETAMETHASONE (LOTRISONE) LOTION    Apply topically 2 (two) times daily.  Previous Medications   ADVAIR DISKUS 250-50 MCG/DOSE AEPB    INHALE 1 PUFF INTO THE LUNGS 2 (TWO) TIMES DAILY.   AMOXICILLIN-POT CLAVULANATE (AUGMENTIN PO)    Take by mouth.   ATORVASTATIN (LIPITOR) 20 MG TABLET    Take 20 mg by mouth daily.   CLONAZEPAM (KLONOPIN) 0.5 MG TABLET    Take one tablet twice a day and take two tablets at bedtime for anxiety   CLOPIDOGREL (PLAVIX) 75 MG TABLET    Take 75 mg by mouth daily.     GEMFIBROZIL (LOPID) 600 MG TABLET    Take 600 mg by mouth daily.    INSULIN ASPART (NOVOLOG) 100 UNIT/ML INJECTION    Inject 5 Units into the skin 3 (three) times daily before meals.     INSULIN GLARGINE (LANTUS) 100 UNIT/ML INJECTION    Inject 10 Units into the skin at bedtime.     LISINOPRIL (PRINIVIL,ZESTRIL) 10 MG TABLET    Take 10 mg by mouth daily.    ONDANSETRON (ZOFRAN)  4 MG TABLET    Take 1 tablet (4 mg total) by mouth every 8 (eight) hours as needed for nausea.   RALOXIFENE (EVISTA) 60 MG TABLET    Take 60 mg by mouth daily.     TRAMADOL (ULTRAM) 50 MG TABLET    Two by mouth at bedtime   Modified Medications   Modified Medication Previous Medication   FUROSEMIDE (LASIX) 40 MG TABLET furosemide (LASIX) 40 MG tablet      Take 0.5 tablets (20 mg total) by mouth daily.    Take 20 mg by mouth daily.    TIOTROPIUM (SPIRIVA HANDIHALER) 18 MCG INHALATION CAPSULE tiotropium (SPIRIVA HANDIHALER) 18 MCG inhalation capsule      Place 1 capsule (18 mcg total) into inhaler and inhale daily.    Place 1 capsule (18 mcg total) into inhaler and inhale daily.  Discontinued Medications   ALBUTEROL (VENTOLIN HFA)  108 (90 BASE) MCG/ACT INHALER    Inhale 2 puffs into the lungs every 6 (six) hours as needed.   CITALOPRAM (CELEXA) 10 MG TABLET    Take 10 mg by mouth continuous as needed.    HYDROCODONE-ACETAMINOPHEN (NORCO/VICODIN) 5-325 MG PER TABLET    Take 1 tablet by mouth every 6 (six) hours as needed for pain.   PROBIOTIC PRODUCT (PROBIOTIC DAILY PO)    Take by mouth.   Physical Exam: Filed Vitals:   04/08/13 1030  BP: 112/62  Pulse: 101  Temp: 97.6 F (36.4 C)  TempSrc: Oral  Resp: 16  Height: 5\' 2"  (1.575 m)  Weight: 134 lb 6.4 oz (60.963 kg)   Physical Exam  Constitutional: She is oriented to person, place, and time. No distress.  Frail chronically ill Caucasian female using portable O2 and coming in wheelchair now.  HENT:  Head: Normocephalic and atraumatic.  Eyes: Pupils are equal, round, and reactive to light.  Neck: Neck supple. No JVD present.  Cardiovascular: Normal rate, regular rhythm and normal heart sounds.   Pulmonary/Chest: Effort normal. She has wheezes. She has no rales.  Abdominal: Soft. Bowel sounds are normal.  Musculoskeletal: Normal range of motion.  Neurological: She is alert and oriented to person, place, and time.  Skin: Skin is warm and dry. Rash noted. She is not diaphoretic.  Psychiatric: She has a normal mood and affect.   Labs reviewed: 03/09 HB 14.0 BUN 19,CR 1.0 LFTS nl Chol 161,LDL 69,HDL 71,TGL 104 TSH nl 11/09/2008 CMP: glucose 112, BUN 19, Creatinine 1.13, Potassium 5.3 Lipid: cholesterol 161, triglycerides 104, HDL 71, LDL 69 11/13/2008 Potassium 5.0 10/20/09 Glu 131, Cr1.19, K+5.3 lipid LDL 81, HDL 7.8  TSH 2.86 09/11/7828  BMP: glucose 147, BUN 19, Creatinine 1.14 A1C: 6.3 Lipid: Cholesterol 190, Triglycerides 117, HDL 90, LDL 77 11/10/2012  CBC: wbc 9.6, rbc 4.71, Hemoglobin 13.4 CMP: glucose 67, BUN 14, Creatinine 1.03 A1C: 6.1 Lipid: Cholesterol 193, Triglycerides 74, HDL 104, LDL 74 12/16/12 CBC; WBC 8.2, RBC 4.48, HGB 12.5 BMP;  Glucose 112, BUN 42, Creatinine 1.49 12/20/12 BMP; Glucose 112, BUN 42, Creatinine 1.49 CBC; WBC 8.2, RBC 4.48, Hemoglobin 12.5 Urinalysis; Nitrite-negative, Protein-Negative 01/10/2013  [facility]) glucose 115 Urine Ketones 15  Basic Metabolic Panel:  Recent Labs  56/21/30 1205 04/28/13 0854  NA 141 143  K 3.7 3.8  CL 99 100  CO2 28 25  GLUCOSE 115* 139*  BUN 21 13  CREATININE 1.10 0.99  CALCIUM 9.6 9.6   Liver Function Tests:  Recent Labs  01/10/13 1205 04/28/13 0854  AST  28 18  ALT 13 15  ALKPHOS 67 94  BILITOT 0.4 0.3  PROT 7.4 6.6  ALBUMIN 3.9  --    CBC:  Recent Labs  01/10/13 1205  WBC 6.9  NEUTROABS 5.1  HGB 13.9  HCT 42.3  MCV 87.9  PLT 296   Past Procedures: 2004 Colonoscopy Dr. Vilinda Boehringer 03/15/2004 Bone Density Osteopenia 12/28/2006 Bone Density Osteopenia 12/02/2007 Arterial Doppler right leg mild obstruction; left iliac disease moderate in nature 03/27/2008 Mammogram-negative 04/24/2009 Bone Density -Osteopenia 05/22/2009 Chest x-ray negative 06/04/2009 Cervical spine-multilevel spondylosis C3-4 and C4-5 08/15/2009 Colonoscopy-Diverticulosis one 5mm polyp in cecum, internal hemorrhoids-Dr. Randa Evens 03/29/2010 Mammogram -negative 03/30/2010 Chest x-ray Right suprahilar nodular density, small bilateral pleural effusions and bibasilar atelecrasis 04/11/2010 Chest X-Ray No pneumonia. Mild peribronchial thickening may indicate bronchitis. Slight hyperaeration  05/09/2010 Lower Arterial Doppler Right ABI appears with biphasic waveforms and suggest mild arterial disease Left ABI appear with biphasic waveforms and suggest moderate arterial disease  06/10/2010 X-Ray Left Foot  soft tissue swelling without acute osseous abnormality  11/05/2010 Chest X-Ray Hyperexpansion and left  lung base atelectasis without other acute findings  12/16/2010 Oximetry Results  04/10/2011 Mammogram Negative  04/20/2012-Mammogram 01/10/2013-Chest X-Ray: Emphysema without acute  finding  Assessment/Plan Type II or unspecified type diabetes mellitus without mention of complication, not stated as uncontrolled Has hba1c ordered for before her visit with Candelaria Celeste, NP.  Using lantus 10 units at hs and 5 units novolog ac meals for cbg >150.  Had a discussion about when to hold insulins:  Hold novolog, but give 1/2 of lantus if not eating well.  Is on ace and statin, but not on asa.    HYPERTENSION BP at goal with ace and diuretic.    C O P D Daughter-in-law still smokes and they will be living together soon.  Pt now using wheelchair for outings since her last exacerbation and influenza hospitalization.  Also has been affected by grief since husband's passing end of last year.  Continues with advair and spiriva with prn albuterol.   Labs/tests ordered:  Keep lab appt before visit with Shanda Bumps, NP

## 2013-04-12 ENCOUNTER — Telehealth: Payer: Self-pay | Admitting: Internal Medicine

## 2013-04-12 MED ORDER — TIOTROPIUM BROMIDE MONOHYDRATE 18 MCG IN CAPS: 18.0000 ug | ORAL_CAPSULE | Freq: Every day | RESPIRATORY_TRACT | Status: DC

## 2013-04-12 NOTE — Telephone Encounter (Signed)
Verified msg with patient. Needing Spiriva sent to [facility] on [address]. Rx sent and nothing further needed at this time.

## 2013-04-20 ENCOUNTER — Other Ambulatory Visit: Payer: Self-pay | Admitting: *Deleted

## 2013-04-20 MED ORDER — FUROSEMIDE 40 MG PO TABS: 20.0000 mg | ORAL_TABLET | Freq: Every day | ORAL | Status: DC

## 2013-04-28 ENCOUNTER — Other Ambulatory Visit: Payer: PRIVATE HEALTH INSURANCE

## 2013-04-28 DIAGNOSIS — F411 Generalized anxiety disorder: Secondary | ICD-10-CM

## 2013-04-28 DIAGNOSIS — E119 Type 2 diabetes mellitus without complications: Secondary | ICD-10-CM

## 2013-04-28 DIAGNOSIS — J449 Chronic obstructive pulmonary disease, unspecified: Secondary | ICD-10-CM

## 2013-04-29 ENCOUNTER — Other Ambulatory Visit: Payer: Self-pay

## 2013-04-29 LAB — COMPREHENSIVE METABOLIC PANEL
Albumin: 3.8 g/dL (ref 3.5–4.7)
BUN: 13 mg/dL (ref 8–27)
Calcium: 9.6 mg/dL (ref 8.6–10.2)
Chloride: 100 mmol/L (ref 97–108)
GFR calc Af Amer: 62 mL/min/{1.73_m2} (ref >59)
Glucose: 139 mg/dL — ABNORMAL HIGH (ref 65–99)
Total Bilirubin: 0.3 mg/dL (ref 0.0–1.2)
Total Protein: 6.6 g/dL (ref 6.0–8.5)

## 2013-04-29 LAB — HEMOGLOBIN A1C
Est. average glucose Bld gHb Est-mCnc: 126 mg/dL
Hgb A1c MFr Bld: 6 % — ABNORMAL HIGH (ref 4.8–5.6)

## 2013-05-01 NOTE — Assessment & Plan Note (Addendum)
Daughter-in-law still smokes and they will be living together soon.  Pt now using wheelchair for outings since her last exacerbation and influenza hospitalization.  Also has been affected by grief since husband's passing end of last year.  Continues with advair and spiriva with prn albuterol.

## 2013-05-01 NOTE — Assessment & Plan Note (Signed)
BP at goal with ace and diuretic.

## 2013-05-01 NOTE — Assessment & Plan Note (Addendum)
Has hba1c ordered for before her visit with Candelaria Celeste, NP.  Using lantus 10 units at hs and 5 units novolog ac meals for cbg >150.  Had a discussion about when to hold insulins:  Hold novolog, but give 1/2 of lantus if not eating well.  Is on ace and statin, but not on asa.

## 2013-05-03 ENCOUNTER — Encounter: Payer: Self-pay | Admitting: Nurse Practitioner

## 2013-05-03 ENCOUNTER — Encounter: Payer: Self-pay | Admitting: *Deleted

## 2013-05-03 ENCOUNTER — Ambulatory Visit (INDEPENDENT_AMBULATORY_CARE_PROVIDER_SITE_OTHER): Payer: PRIVATE HEALTH INSURANCE | Admitting: Nurse Practitioner

## 2013-05-03 VITALS — BP 132/84 | HR 58 | Temp 97.6°F | Resp 14 | Ht 62.0 in | Wt 117.4 lb

## 2013-05-03 DIAGNOSIS — I10 Essential (primary) hypertension: Secondary | ICD-10-CM

## 2013-05-03 DIAGNOSIS — F32A Depression, unspecified: Secondary | ICD-10-CM | POA: Insufficient documentation

## 2013-05-03 DIAGNOSIS — R5381 Other malaise: Secondary | ICD-10-CM

## 2013-05-03 DIAGNOSIS — E119 Type 2 diabetes mellitus without complications: Secondary | ICD-10-CM

## 2013-05-03 DIAGNOSIS — F411 Generalized anxiety disorder: Secondary | ICD-10-CM | POA: Insufficient documentation

## 2013-05-03 DIAGNOSIS — J449 Chronic obstructive pulmonary disease, unspecified: Secondary | ICD-10-CM

## 2013-05-03 DIAGNOSIS — F329 Major depressive disorder, single episode, unspecified: Secondary | ICD-10-CM | POA: Insufficient documentation

## 2013-05-03 DIAGNOSIS — G609 Hereditary and idiopathic neuropathy, unspecified: Secondary | ICD-10-CM | POA: Insufficient documentation

## 2013-05-03 DIAGNOSIS — R5383 Other fatigue: Secondary | ICD-10-CM

## 2013-05-03 NOTE — Progress Notes (Signed)
Patient ID: Jennifer Duran, female   DOB: [birth year], 79 y.o.   MRN: [id number] Code Status: DNR  Allergies  Allergen Reactions  . Celecoxib Swelling  . Codeine   . Metronidazole   . Pregabalin     REACTION: rash/hives  . Pregabalin Swelling  . Propranolol Hcl   . Rofecoxib   . Iodine Rash    Only if ingested    Chief Complaint  Patient presents with  . Medical Managment of Chronic Issues    HPI: Patient is a 79 y.o. female seen in the office today for routine follow up and review of lab work  Review of Systems:  Review of Systems  Constitutional: Positive for weight loss (but now daughter has moved in and she is having regular meals with her ). Negative for fever and chills.  Respiratory: Negative for cough and shortness of breath.   Cardiovascular: Negative for chest pain, palpitations and leg swelling.  Gastrointestinal: Negative for abdominal pain, diarrhea and constipation.  Genitourinary: Negative for dysuria, urgency and frequency.  Musculoskeletal: Negative for myalgias, back pain and joint pain.  Skin: Negative for itching and rash.       Using eucerin cream  Neurological: Positive for weakness.  Psychiatric/Behavioral: Positive for depression (but some better). The patient is not nervous/anxious and does not have insomnia.      Past Medical History  Diagnosis Date  . Allergic rhinitis, cause unspecified   . Unspecified essential hypertension   . Cor pulmonale   . COPD (chronic obstructive pulmonary disease)   . Type II or unspecified type diabetes mellitus without mention of complication, not stated as uncontrolled   . Osteoporosis   . Hashimoto's disease   . Peripheral vascular disease   . Hyperlipidemia   . Depression   . Urinary frequency   . Other atopic dermatitis and related conditions   . Abnormal involuntary movements   . Hyposmolality and/or hyponatremia   . Peripheral vascular disease, unspecified    Past Surgical History  Procedure  Laterality Date  . Total abdominal hysterectomy    . Cataracts    . Rotator cuff repair    . Mastectomy Right 1952  . Eye surgery     Social History:   reports that she has quit smoking. She has never used smokeless tobacco. She reports that she does not drink alcohol or use illicit drugs.  Family History  Problem Relation Age of Onset  . Cancer Mother     lung   . Lupus Father   . Heart disease Sister     Medications: Patient's Medications  New Prescriptions   No medications on file  Previous Medications   ADVAIR DISKUS 250-50 MCG/DOSE AEPB    INHALE 1 PUFF INTO THE LUNGS 2 (TWO) TIMES DAILY.   ATORVASTATIN (LIPITOR) 20 MG TABLET    Take 20 mg by mouth daily.   CLONAZEPAM (KLONOPIN) 0.5 MG TABLET    Take one tablet twice a day and take two tablets at bedtime for anxiety   CLOPIDOGREL (PLAVIX) 75 MG TABLET    Take 75 mg by mouth daily.     CLOTRIMAZOLE-BETAMETHASONE (LOTRISONE) LOTION    Apply topically 2 (two) times daily.   FUROSEMIDE (LASIX) 40 MG TABLET    Take 0.5 tablets (20 mg total) by mouth daily.   GEMFIBROZIL (LOPID) 600 MG TABLET    Take 600 mg by mouth daily.    INSULIN ASPART (NOVOLOG) 100 UNIT/ML INJECTION    Inject 5 Units into  the skin 3 (three) times daily before meals.     INSULIN GLARGINE (LANTUS) 100 UNIT/ML INJECTION    Inject 10 Units into the skin at bedtime.     LISINOPRIL (PRINIVIL,ZESTRIL) 10 MG TABLET    Take 10 mg by mouth daily.    RALOXIFENE (EVISTA) 60 MG TABLET    Take 60 mg by mouth daily.     TIOTROPIUM (SPIRIVA HANDIHALER) 18 MCG INHALATION CAPSULE    Place 1 capsule (18 mcg total) into inhaler and inhale daily.   TRAMADOL (ULTRAM) 50 MG TABLET    Two by mouth at bedtime   Modified Medications   No medications on file  Discontinued Medications   AMOXICILLIN-POT CLAVULANATE (AUGMENTIN PO)    Take by mouth.   ONDANSETRON (ZOFRAN) 4 MG TABLET    Take 1 tablet (4 mg total) by mouth every 8 (eight) hours as needed for nausea.     Physical  Exam:  Filed Vitals:   05/03/13 1320  BP: 132/84  Pulse: 58  Temp: 97.6 F (36.4 C)  TempSrc: Oral  Resp: 14  Height: 5\' 2"  (1.575 m)  Weight: 117 lb 6.4 oz (53.252 kg)  SpO2: 95%   Physical Exam  Constitutional: She is oriented to person, place, and time. She appears well-developed and well-nourished. No distress.  Cardiovascular: Normal rate, regular rhythm and normal heart sounds.   Pulmonary/Chest: Effort normal. No respiratory distress. She has no wheezes.  diminshed breath sounds throughout  Abdominal: Soft. Bowel sounds are normal.  Neurological: She is alert and oriented to person, place, and time.  Skin: Skin is warm and dry. She is not diaphoretic.    .  Labs reviewed: Basic Metabolic Panel:  Recent Labs  16/10/96 1205 04/28/13 0854  NA 141 143  K 3.7 3.8  CL 99 100  CO2 28 25  GLUCOSE 115* 139*  BUN 21 13  CREATININE 1.10 0.99  CALCIUM 9.6 9.6   Liver Function Tests:  Recent Labs  01/10/13 1205 04/28/13 0854  AST 28 18  ALT 13 15  ALKPHOS 67 94  BILITOT 0.4 0.3  PROT 7.4 6.6  ALBUMIN 3.9  --    No results found for this basename: LIPASE, AMYLASE,  in the last 8760 hours No results found for this basename: AMMONIA,  in the last 8760 hours CBC:  Recent Labs  01/10/13 1205  WBC 6.9  NEUTROABS 5.1  HGB 13.9  HCT 42.3  MCV 87.9  PLT 296     Assessment/Plan  Other malaise and fatigue 780.79   Labs reviewed at visit- will add cbc and tsh- pts daughter now living with her helping with house hold chores and cooking- hopefully this will help with pts work and stress and improve energy levels.   HYPERTENSION- Patient is stable; continue current regimen. Will monitor and make changes as necessary.  C O P D Patients COPD is stable- on chronic o2 .  Type II or unspecified type diabetes mellitus pts diabetes is stable- will not make changes at this time  Depression /Generalized anxiety disorder- stable off medications at this time.    Unspecified hereditary and idiopathic peripheral neuropathy - stable on tramadol

## 2013-05-03 NOTE — Assessment & Plan Note (Signed)
Patients diabetes is stable; recent a1c at goal at 6. Will continue current regimen. Will monitor and make changes as necessary.

## 2013-05-03 NOTE — Assessment & Plan Note (Signed)
Stable- improved since she has moved in with her daughter- taking clonazepam PRN

## 2013-05-03 NOTE — Assessment & Plan Note (Signed)
Stable- takes tramadol which she states helps the pain

## 2013-05-03 NOTE — Assessment & Plan Note (Signed)
Stable at this time- on chronic oxygen

## 2013-05-04 LAB — CBC WITH DIFFERENTIAL/PLATELET
Basos: 0 % (ref 0–3)
Eos: 7 % — ABNORMAL HIGH (ref 0–5)
Immature Grans (Abs): 0 10*3/uL (ref 0.0–0.1)
Immature Granulocytes: 0 % (ref 0–2)
Lymphs: 18 % (ref 14–46)
MCV: 86 fL (ref 79–97)
Monocytes: 8 % (ref 4–12)
Neutrophils Relative %: 67 % (ref 40–74)
RBC: 3.93 x10E6/uL (ref 3.77–5.28)
WBC: 8 10*3/uL (ref 3.4–10.8)

## 2013-05-04 LAB — TSH: TSH: 0.68 u[IU]/mL (ref 0.450–4.500)

## 2013-05-05 ENCOUNTER — Other Ambulatory Visit: Payer: Self-pay | Admitting: Geriatric Medicine

## 2013-05-05 ENCOUNTER — Other Ambulatory Visit: Payer: Self-pay | Admitting: Internal Medicine

## 2013-05-05 ENCOUNTER — Encounter: Payer: Self-pay | Admitting: Nurse Practitioner

## 2013-05-05 MED ORDER — GEMFIBROZIL 600 MG PO TABS: ORAL_TABLET | ORAL | Status: DC

## 2013-05-10 ENCOUNTER — Encounter: Payer: Self-pay | Admitting: Nurse Practitioner

## 2013-05-10 NOTE — Assessment & Plan Note (Signed)
Patients hypertension is stable; continue current regimen. Will monitor and make changes as necessary.  

## 2013-05-19 ENCOUNTER — Telehealth: Payer: Self-pay | Admitting: *Deleted

## 2013-05-19 ENCOUNTER — Emergency Department (HOSPITAL_BASED_OUTPATIENT_CLINIC_OR_DEPARTMENT_OTHER)
Admission: EM | Admit: 2013-05-19 | Discharge: 2013-05-19 | Disposition: A | Discharge status: Home / Self Care | Payer: PRIVATE HEALTH INSURANCE | Attending: Emergency Medicine | Admitting: Emergency Medicine

## 2013-05-19 ENCOUNTER — Emergency Department (HOSPITAL_BASED_OUTPATIENT_CLINIC_OR_DEPARTMENT_OTHER): Payer: PRIVATE HEALTH INSURANCE

## 2013-05-19 ENCOUNTER — Encounter (HOSPITAL_BASED_OUTPATIENT_CLINIC_OR_DEPARTMENT_OTHER): Payer: Self-pay

## 2013-05-19 DIAGNOSIS — I10 Essential (primary) hypertension: Secondary | ICD-10-CM | POA: Insufficient documentation

## 2013-05-19 DIAGNOSIS — Z872 Personal history of diseases of the skin and subcutaneous tissue: Secondary | ICD-10-CM | POA: Insufficient documentation

## 2013-05-19 DIAGNOSIS — Z862 Personal history of diseases of the blood and blood-forming organs and certain disorders involving the immune mechanism: Secondary | ICD-10-CM | POA: Insufficient documentation

## 2013-05-19 DIAGNOSIS — E063 Autoimmune thyroiditis: Secondary | ICD-10-CM | POA: Insufficient documentation

## 2013-05-19 DIAGNOSIS — R5381 Other malaise: Secondary | ICD-10-CM | POA: Insufficient documentation

## 2013-05-19 DIAGNOSIS — F3289 Other specified depressive episodes: Secondary | ICD-10-CM | POA: Insufficient documentation

## 2013-05-19 DIAGNOSIS — M810 Age-related osteoporosis without current pathological fracture: Secondary | ICD-10-CM | POA: Insufficient documentation

## 2013-05-19 DIAGNOSIS — J449 Chronic obstructive pulmonary disease, unspecified: Secondary | ICD-10-CM

## 2013-05-19 DIAGNOSIS — R222 Localized swelling, mass and lump, trunk: Secondary | ICD-10-CM | POA: Insufficient documentation

## 2013-05-19 DIAGNOSIS — Z8679 Personal history of other diseases of the circulatory system: Secondary | ICD-10-CM | POA: Insufficient documentation

## 2013-05-19 DIAGNOSIS — J441 Chronic obstructive pulmonary disease with (acute) exacerbation: Secondary | ICD-10-CM | POA: Insufficient documentation

## 2013-05-19 DIAGNOSIS — Z9981 Dependence on supplemental oxygen: Secondary | ICD-10-CM | POA: Insufficient documentation

## 2013-05-19 DIAGNOSIS — Z8709 Personal history of other diseases of the respiratory system: Secondary | ICD-10-CM | POA: Insufficient documentation

## 2013-05-19 DIAGNOSIS — E119 Type 2 diabetes mellitus without complications: Secondary | ICD-10-CM | POA: Insufficient documentation

## 2013-05-19 DIAGNOSIS — R259 Unspecified abnormal involuntary movements: Secondary | ICD-10-CM | POA: Insufficient documentation

## 2013-05-19 DIAGNOSIS — Z8639 Personal history of other endocrine, nutritional and metabolic disease: Secondary | ICD-10-CM | POA: Insufficient documentation

## 2013-05-19 DIAGNOSIS — R918 Other nonspecific abnormal finding of lung field: Secondary | ICD-10-CM

## 2013-05-19 DIAGNOSIS — F329 Major depressive disorder, single episode, unspecified: Secondary | ICD-10-CM | POA: Insufficient documentation

## 2013-05-19 DIAGNOSIS — Z79899 Other long term (current) drug therapy: Secondary | ICD-10-CM | POA: Insufficient documentation

## 2013-05-19 DIAGNOSIS — Z87891 Personal history of nicotine dependence: Secondary | ICD-10-CM | POA: Insufficient documentation

## 2013-05-19 DIAGNOSIS — Z794 Long term (current) use of insulin: Secondary | ICD-10-CM | POA: Insufficient documentation

## 2013-05-19 DIAGNOSIS — Z7902 Long term (current) use of antithrombotics/antiplatelets: Secondary | ICD-10-CM | POA: Insufficient documentation

## 2013-05-19 LAB — URINALYSIS, ROUTINE W REFLEX MICROSCOPIC
Nitrite: NEGATIVE
Specific Gravity, Urine: 1.017 (ref 1.005–1.030)
Urobilinogen, UA: 0.2 mg/dL (ref 0.0–1.0)
pH: 6 (ref 5.0–8.0)

## 2013-05-19 LAB — COMPREHENSIVE METABOLIC PANEL
ALT: 8 U/L (ref 0–35)
Albumin: 3.6 g/dL (ref 3.5–5.2)
Alkaline Phosphatase: 55 U/L (ref 39–117)
BUN: 14 mg/dL (ref 6–23)
Chloride: 105 mEq/L (ref 96–112)
Potassium: 3.7 mEq/L (ref 3.5–5.1)
Sodium: 143 mEq/L (ref 135–145)
Total Bilirubin: 0.4 mg/dL (ref 0.3–1.2)
Total Protein: 6.8 g/dL (ref 6.0–8.3)

## 2013-05-19 LAB — CBC WITH DIFFERENTIAL/PLATELET
Basophils Relative: 0 % (ref 0–1)
Hemoglobin: 13.5 g/dL (ref 12.0–15.0)
Lymphs Abs: 1.3 10*3/uL (ref 0.7–4.0)
MCHC: 32.8 g/dL (ref 30.0–36.0)
Monocytes Relative: 8 % (ref 3–12)
Neutro Abs: 6.5 10*3/uL (ref 1.7–7.7)
Neutrophils Relative %: 73 % (ref 43–77)
Platelets: 364 10*3/uL (ref 150–400)
RBC: 4.72 MIL/uL (ref 3.87–5.11)

## 2013-05-19 LAB — URINE MICROSCOPIC-ADD ON

## 2013-05-19 MED ORDER — METHYLPREDNISOLONE SODIUM SUCC 125 MG IJ SOLR
125.0000 mg | Freq: Once | INTRAMUSCULAR | Status: AC
  Administered 2013-05-19: 125 mg via INTRAVENOUS
  Filled 2013-05-19: qty 2

## 2013-05-19 MED ORDER — DIPHENHYDRAMINE HCL 50 MG/ML IJ SOLN
25.0000 mg | Freq: Once | INTRAMUSCULAR | Status: AC
  Administered 2013-05-19: 25 mg via INTRAVENOUS
  Filled 2013-05-19: qty 1

## 2013-05-19 MED ORDER — IOHEXOL 300 MG/ML  SOLN
80.0000 mL | Freq: Once | INTRAMUSCULAR | Status: AC | PRN
  Administered 2013-05-19: 80 mL via INTRAVENOUS

## 2013-05-19 MED ORDER — SODIUM CHLORIDE 0.9 % IV SOLN
INTRAVENOUS | Status: DC
  Administered 2013-05-19: 16:00:00 via INTRAVENOUS

## 2013-05-19 NOTE — ED Provider Notes (Signed)
History     CSN: [id number]  Arrival date & time 05/19/13  1250   First MD Initiated Contact with Patient 05/19/13 1317      Chief Complaint  Patient presents with  . Weakness  . Shortness of Breath    (Consider location/radiation/quality/duration/timing/severity/associated sxs/prior treatment) HPI Comments: Patient is an 79 year old lady who has known COPD and diabetes. She is on home oxygen 2 L per minute. She says she feels as though she is falling apart, and feels weak. This morning she was quite short of breath which is the reason why she was brought to [facility] ED for evaluation. Also, her husband died this past year. She is very depressed and wishes to be reunited with him in death.  She denies any suicidal ideation, however.  Patient is a 79 y.o. female presenting with shortness of breath. The history is provided by the patient (The daughter). No language interpreter was used.  Shortness of Breath Severity:  Moderate Onset quality:  Gradual Timing:  Constant Progression:  Worsening Chronicity:  Chronic Context: emotional upset (she is depressed by the fact that her husband died this past year.)   Context comment:  She was unusually short of breath this morning despite home oxygen. Relieved by:  Nothing Worsened by:  Nothing tried Ineffective treatments:  Oxygen Associated symptoms: no fever   Risk factors comment:  Long-standing COPD.   Past Medical History  Diagnosis Date  . Allergic rhinitis, cause unspecified   . Unspecified essential hypertension   . Cor pulmonale   . COPD (chronic obstructive pulmonary disease)   . Type II or unspecified type diabetes mellitus without mention of complication, not stated as uncontrolled   . Osteoporosis   . Hashimoto's disease   . Peripheral vascular disease   . Hyperlipidemia   . Depression   . Urinary frequency   . Other atopic dermatitis and related conditions   . Abnormal involuntary movements   .  Hyposmolality and/or hyponatremia   . Peripheral vascular disease, unspecified     Past Surgical History  Procedure Laterality Date  . Total abdominal hysterectomy    . Cataracts    . Rotator cuff repair    . Mastectomy Right 1952  . Eye surgery      Family History  Problem Relation Age of Onset  . Cancer Mother     lung   . Lupus Father   . Heart disease Sister     History  Substance Use Topics  . Smoking status: Former Smoker -- 2.00 packs/day for 53 years  . Smokeless tobacco: Never Used  . Alcohol Use: No    OB History   Grav Para Term Preterm Abortions TAB SAB Ect Mult Living                  Review of Systems  Constitutional: Negative for fever and chills.       Generalized weakness.  HENT: Negative.   Eyes: Negative.   Respiratory: Positive for shortness of breath.   Cardiovascular: Negative.   Gastrointestinal: Negative.   Genitourinary: Negative.   Musculoskeletal: Negative.   Skin: Negative.   Neurological: Positive for weakness.  Psychiatric/Behavioral: Positive for dysphoric mood.    Allergies  Celecoxib; Codeine; Metronidazole; Pregabalin; Pregabalin; Propranolol hcl; Rofecoxib; and Iodine  Home Medications   Current Outpatient Rx  Name  Route  Sig  Dispense  Refill  . ADVAIR DISKUS 250-50 MCG/DOSE AEPB      INHALE 1 PUFF INTO  THE LUNGS 2 (TWO) TIMES DAILY.   60 each   1   . atorvastatin (LIPITOR) 20 MG tablet   Oral   Take 20 mg by mouth daily.         . clonazePAM (KLONOPIN) 0.5 MG tablet      Take one tablet twice a day and take two tablets at bedtime for anxiety   120 tablet   5   . clopidogrel (PLAVIX) 75 MG tablet   Oral   Take 75 mg by mouth daily.           . clotrimazole-betamethasone (LOTRISONE) lotion   Topical   Apply topically 2 (two) times daily.   30 mL   0   . furosemide (LASIX) 40 MG tablet   Oral   Take 0.5 tablets (20 mg total) by mouth daily.   30 tablet   5   . gemfibrozil (LOPID) 600 MG  tablet      Take one tablet by mouth daily for cholesterol.   30 tablet   3   . insulin aspart (NOVOLOG) 100 UNIT/ML injection   Subcutaneous   Inject 5 Units into the skin 3 (three) times daily before meals.           . insulin glargine (LANTUS) 100 UNIT/ML injection   Subcutaneous   Inject 10 Units into the skin at bedtime.           Marland Kitchen lisinopril (PRINIVIL,ZESTRIL) 10 MG tablet   Oral   Take 10 mg by mouth daily.          . raloxifene (EVISTA) 60 MG tablet   Oral   Take 60 mg by mouth daily.           Marland Kitchen tiotropium (SPIRIVA HANDIHALER) 18 MCG inhalation capsule   Inhalation   Place 1 capsule (18 mcg total) into inhaler and inhale daily.   90 capsule   3   . traMADol (ULTRAM) 50 MG tablet      Two by mouth at bedtime            BP 128/67  Pulse 86  Temp(Src) 98.1 F (36.7 C) (Oral)  Resp 18  Ht 5\' 2"  (1.575 m)  Wt 117 lb (53.071 kg)  BMI 21.39 kg/m2  SpO2 98%  Physical Exam  Nursing note and vitals reviewed. Constitutional: She is oriented to person, place, and time.  Slender, frail appearing elderly woman, breathing nasal oxygen.  HENT:  Head: Normocephalic and atraumatic.  Right Ear: External ear normal.  Left Ear: External ear normal.  Mouth/Throat: Oropharynx is clear and moist.  Eyes: Conjunctivae and EOM are normal. Pupils are equal, round, and reactive to light. No scleral icterus.  Neck: Normal range of motion. Neck supple.  Cardiovascular: Normal rate, regular rhythm and normal heart sounds.   Pulmonary/Chest: Effort normal and breath sounds normal.  Abdominal: Soft. Bowel sounds are normal.  Musculoskeletal: Normal range of motion. She exhibits no edema and no tenderness.  Neurological: She is alert and oriented to person, place, and time.  No sensory or motor deficit.  Skin: Skin is warm and dry.  Psychiatric:  Depressed affect, tearful when talking about her husband.    ED Course  Procedures (including critical care time)    Date: 05/19/2013  Rate: 84  Rhythm: normal sinus rhythm  QRS Axis: normal  Intervals: normal  ST/T Wave abnormalities: nonspecific ST changes  Conduction Disutrbances:none  Narrative Interpretation: Abnormal EKG  Old EKG Reviewed: none available  3:24 PM Results for orders placed during the hospital encounter of 05/19/13  URINALYSIS, ROUTINE W REFLEX MICROSCOPIC      Result Value Range   Color, Urine YELLOW  YELLOW   APPearance CLEAR  CLEAR   Specific Gravity, Urine 1.017  1.005 - 1.030   pH 6.0  5.0 - 8.0   Glucose, UA NEGATIVE  NEGATIVE mg/dL   Hgb urine dipstick NEGATIVE  NEGATIVE   Bilirubin Urine NEGATIVE  NEGATIVE   Ketones, ur NEGATIVE  NEGATIVE mg/dL   Protein, ur NEGATIVE  NEGATIVE mg/dL   Urobilinogen, UA 0.2  0.0 - 1.0 mg/dL   Nitrite NEGATIVE  NEGATIVE   Leukocytes, UA SMALL (*) NEGATIVE  CBC WITH DIFFERENTIAL      Result Value Range   WBC 8.9  4.0 - 10.5 K/uL   RBC 4.72  3.87 - 5.11 MIL/uL   Hemoglobin 13.5  12.0 - 15.0 g/dL   HCT 96.0  45.4 - 09.8 %   MCV 87.1  78.0 - 100.0 fL   MCH 28.6  26.0 - 34.0 pg   MCHC 32.8  30.0 - 36.0 g/dL   RDW 11.9  14.7 - 82.9 %   Platelets 364  150 - 400 K/uL   Neutrophils Relative % 73  43 - 77 %   Neutro Abs 6.5  1.7 - 7.7 K/uL   Lymphocytes Relative 15  12 - 46 %   Lymphs Abs 1.3  0.7 - 4.0 K/uL   Monocytes Relative 8  3 - 12 %   Monocytes Absolute 0.7  0.1 - 1.0 K/uL   Eosinophils Relative 4  0 - 5 %   Eosinophils Absolute 0.3  0.0 - 0.7 K/uL   Basophils Relative 0  0 - 1 %   Basophils Absolute 0.0  0.0 - 0.1 K/uL  COMPREHENSIVE METABOLIC PANEL      Result Value Range   Sodium 143  135 - 145 mEq/L   Potassium 3.7  3.5 - 5.1 mEq/L   Chloride 105  96 - 112 mEq/L   CO2 24  19 - 32 mEq/L   Glucose, Bld 90  70 - 99 mg/dL   BUN 14  6 - 23 mg/dL   Creatinine, Ser 5.62  0.50 - 1.10 mg/dL   Calcium 9.9  8.4 - 13.0 mg/dL   Total Protein 6.8  6.0 - 8.3 g/dL   Albumin 3.6  3.5 - 5.2 g/dL   AST 22  0 - 37 U/L   ALT 8  0 -  35 U/L   Alkaline Phosphatase 55  39 - 117 U/L   Total Bilirubin 0.4  0.3 - 1.2 mg/dL   GFR calc non Af Amer 67 (*) >90 mL/min   GFR calc Af Amer 78 (*) >90 mL/min  URINE MICROSCOPIC-ADD ON      Result Value Range   Squamous Epithelial / LPF RARE  RARE   WBC, UA 3-6  <3 WBC/hpf   RBC / HPF 0-2  <3 RBC/hpf   Bacteria, UA FEW (*) RARE   Casts HYALINE CASTS (*) NEGATIVE   Urine-Other MUCOUS PRESENT    TROPONIN I      Result Value Range   Troponin I <0.30  <0.30 ng/mL   Dg Chest 2 View  05/19/2013   *RADIOLOGY REPORT*  Clinical Data: Shortness of breath, weakness, body aches  CHEST - 2 VIEW  Comparison: Chest x-ray of 01/10/2013  Findings: There is a nodular opacity which appears new within the  left upper lung field probably within the mid left upper lobe on the lateral view, suspicious for lung malignancy.  CT of the chest with IV contrast media is recommended. The lungs are hyperaerated consistent with emphysema.  No effusion is noted.  Mediastinal contours appear normal.  The heart is within normal limits in size. No bony abnormality is seen.  IMPRESSION:  1.  New nodular opacity in the left upper lobe worrisome for lung malignancy.  Recommend CT of the chest with IV contrast media. 2.  Emphysema.   Original Report Authenticated By: Dwyane Dee, M.D.    3:24 PM Lab tests essentially negative, but chest x-ray shows a LUL pulmonary nodule.  I discussed this finding with pt and with her daughter.  Will order chest CT with IV contrast to further evaluate the pulmonary nodule.    Study Result    *RADIOLOGY REPORT*  Clinical Data: Abnormal chest radiograph question left upper lobe  nodule, weakness, shortness of breath, COPD, diabetes,  hypertension, former smoker  CT CHEST WITH CONTRAST  Technique: Multidetector CT imaging of the chest was performed  following the standard protocol during bolus administration of  intravenous contrast. Sagittal and coronal MPR images reconstructed  from  axial data set.  Contrast: 80mL OMNIPAQUE IOHEXOL 300 MG/ML SOLN  Comparison: 12/02/2012  Correlation: Chest radiograph 05/19/2013  Findings:  Thoracic vascular structures patent on non dedicated exam.  Scattered atherosclerotic calcifications aorta and coronary  arteries.  No thoracic adenopathy.  Visualized portion of upper abdomen unremarkable.  Severe emphysematous changes greatest in the upper lobes.  Spiculated nodular density in left upper lobe measuring 2.6 x 1.4 x  2.5 cm consistent with a pulmonary neoplasm increased in size since  previous study when it measured 9 mm greatest diameter.  Vague area of questionable nodularity in right lower lobe is  indistinct, grossly stable.  No acute infiltrate, pleural effusion, or pneumothorax.  No additional pulmonary mass or nodule.  Bones diffusely demineralized without acute abnormality.  IMPRESSION:  Severe emphysematous changes consistent with COPD.  2.6 x 1.4 x 2.5 cm diameter spiculated density in left upper lobe  significantly increased in size since previous study consistent  with a pulmonary neoplasm.  Original Report Authenticated By: Ulyses Southward, M.D.    CT chest shows appx 2.5 cm diameter pulmonary nodule in left upper lobe, suspicious for pumonary neoplasm.  Pt and her daughter were advised of this finding by Dr. Deretha Emory.  They will followup with her pulmonologist, Jetty Duhamel, M.D.  1. Lung mass   2. COPD (chronic obstructive pulmonary disease)            Carleene Cooper III, MD 05/19/13 2134

## 2013-05-19 NOTE — ED Provider Notes (Signed)
Results for orders placed during the hospital encounter of 05/19/13  URINALYSIS, ROUTINE W REFLEX MICROSCOPIC      Result Value Range   Color, Urine YELLOW  YELLOW   APPearance CLEAR  CLEAR   Specific Gravity, Urine 1.017  1.005 - 1.030   pH 6.0  5.0 - 8.0   Glucose, UA NEGATIVE  NEGATIVE mg/dL   Hgb urine dipstick NEGATIVE  NEGATIVE   Bilirubin Urine NEGATIVE  NEGATIVE   Ketones, ur NEGATIVE  NEGATIVE mg/dL   Protein, ur NEGATIVE  NEGATIVE mg/dL   Urobilinogen, UA 0.2  0.0 - 1.0 mg/dL   Nitrite NEGATIVE  NEGATIVE   Leukocytes, UA SMALL (*) NEGATIVE  CBC WITH DIFFERENTIAL      Result Value Range   WBC 8.9  4.0 - 10.5 K/uL   RBC 4.72  3.87 - 5.11 MIL/uL   Hemoglobin 13.5  12.0 - 15.0 g/dL   HCT 16.1  09.6 - 04.5 %   MCV 87.1  78.0 - 100.0 fL   MCH 28.6  26.0 - 34.0 pg   MCHC 32.8  30.0 - 36.0 g/dL   RDW 40.9  81.1 - 91.4 %   Platelets 364  150 - 400 K/uL   Neutrophils Relative % 73  43 - 77 %   Neutro Abs 6.5  1.7 - 7.7 K/uL   Lymphocytes Relative 15  12 - 46 %   Lymphs Abs 1.3  0.7 - 4.0 K/uL   Monocytes Relative 8  3 - 12 %   Monocytes Absolute 0.7  0.1 - 1.0 K/uL   Eosinophils Relative 4  0 - 5 %   Eosinophils Absolute 0.3  0.0 - 0.7 K/uL   Basophils Relative 0  0 - 1 %   Basophils Absolute 0.0  0.0 - 0.1 K/uL  COMPREHENSIVE METABOLIC PANEL      Result Value Range   Sodium 143  135 - 145 mEq/L   Potassium 3.7  3.5 - 5.1 mEq/L   Chloride 105  96 - 112 mEq/L   CO2 24  19 - 32 mEq/L   Glucose, Bld 90  70 - 99 mg/dL   BUN 14  6 - 23 mg/dL   Creatinine, Ser 7.82  0.50 - 1.10 mg/dL   Calcium 9.9  8.4 - 95.6 mg/dL   Total Protein 6.8  6.0 - 8.3 g/dL   Albumin 3.6  3.5 - 5.2 g/dL   AST 22  0 - 37 U/L   ALT 8  0 - 35 U/L   Alkaline Phosphatase 55  39 - 117 U/L   Total Bilirubin 0.4  0.3 - 1.2 mg/dL   GFR calc non Af Amer 67 (*) >90 mL/min   GFR calc Af Amer 78 (*) >90 mL/min  URINE MICROSCOPIC-ADD ON      Result Value Range   Squamous Epithelial / LPF RARE  RARE   WBC, UA 3-6  <3 WBC/hpf   RBC / HPF 0-2  <3 RBC/hpf   Bacteria, UA FEW (*) RARE   Casts HYALINE CASTS (*) NEGATIVE   Urine-Other MUCOUS PRESENT    TROPONIN I      Result Value Range   Troponin I <0.30  <0.30 ng/mL   Dg Chest 2 View  05/19/2013   *RADIOLOGY REPORT*  Clinical Data: Shortness of breath, weakness, body aches  CHEST - 2 VIEW  Comparison: Chest x-ray of 01/10/2013  Findings: There is a nodular opacity which appears new within the left upper lung  field probably within the mid left upper lobe on the lateral view, suspicious for lung malignancy.  CT of the chest with IV contrast media is recommended. The lungs are hyperaerated consistent with emphysema.  No effusion is noted.  Mediastinal contours appear normal.  The heart is within normal limits in size. No bony abnormality is seen.  IMPRESSION:  1.  New nodular opacity in the left upper lobe worrisome for lung malignancy.  Recommend CT of the chest with IV contrast media. 2.  Emphysema.   Original Report Authenticated By: Dwyane Dee, M.D.   Ct Chest W Contrast  05/19/2013   *RADIOLOGY REPORT*  Clinical Data: Abnormal chest radiograph question left upper lobe nodule, weakness, shortness of breath, COPD, diabetes, hypertension, former smoker  CT CHEST WITH CONTRAST  Technique:  Multidetector CT imaging of the chest was performed following the standard protocol during bolus administration of intravenous contrast. Sagittal and coronal MPR images reconstructed from axial data set.  Contrast: 80mL OMNIPAQUE IOHEXOL 300 MG/ML  SOLN  Comparison: 12/02/2012 Correlation:  Chest radiograph 05/19/2013  Findings: Thoracic vascular structures patent on non dedicated exam. Scattered atherosclerotic calcifications aorta and coronary arteries. No thoracic adenopathy. Visualized portion of upper abdomen unremarkable. Severe emphysematous changes greatest in the upper lobes. Spiculated nodular density in left upper lobe measuring 2.6 x 1.4 x 2.5 cm consistent  with a pulmonary neoplasm increased in size since previous study when it measured 9 mm greatest diameter. Vague area of questionable nodularity in right lower lobe is indistinct, grossly stable. No acute infiltrate, pleural effusion, or pneumothorax. No additional pulmonary mass or nodule. Bones diffusely demineralized without acute abnormality.  IMPRESSION: Severe emphysematous changes consistent with COPD. 2.6 x 1.4 x 2.5 cm diameter spiculated density in left upper lobe significantly increased in size since previous study consistent with a pulmonary neoplasm.   Original Report Authenticated By: Ulyses Southward, M.D.    The patient is followed by Shella Maxim from pulmonology. They will call tomorrow for appointment and further evaluation of the lung mass which is highly suspicious for a neoplasm. Patient already on oxygen normally uses 2 or 3 L. No wheezing currently. Patient is to be discharged home she does have her daughter to stay with her. They will contact Dr. Maple Hudson for followup.  Shelda Jakes, MD 05/19/13 [phone]

## 2013-05-19 NOTE — Telephone Encounter (Signed)
Jennifer Duran ,daughter called again and stated that her mother would not let her take her to the hospital. I told her to hang up again and call 911. She called back and stated that their [facility]. She will keep [place] informed

## 2013-05-19 NOTE — ED Notes (Signed)
Awaiting daughter to bring portable O2 tank

## 2013-05-19 NOTE — Telephone Encounter (Signed)
Jennifer Duran  daughter called that her mother had to up her oxygen to 3 liters and still is having a hard time breathing. I asked her if she took her vitals. Daughter stated No. Asked her if her mother skin was clammy,. Response was yes. Per Dr Renato Gails and Shanda Bumps hang up and call 911

## 2013-05-19 NOTE — ED Notes (Signed)
Pt reports generalized weakness and SHOB that started yesterday.

## 2013-05-20 ENCOUNTER — Telehealth: Payer: Self-pay | Admitting: Internal Medicine

## 2013-05-20 LAB — URINE CULTURE

## 2013-05-20 NOTE — Telephone Encounter (Signed)
Pt is aware and appt set for 05-25-13 at 11:30am. Carron Curie, CMA

## 2013-05-20 NOTE — Telephone Encounter (Signed)
ATCx2, line busy, WCB. Carron Curie, CMA

## 2013-05-20 NOTE — Telephone Encounter (Signed)
PT seen in ED last night. Was told that malignancy is left upper lobe has increased. Would like an appt w/ CY ASAP. Offered this AM @ 10:15 & 6/28 @ 10:15-refused both. Would like something later today if possible.  Jennifer Duran

## 2013-05-20 NOTE — Telephone Encounter (Signed)
Duplicate message regarding same thing. Will close this message

## 2013-05-20 NOTE — Telephone Encounter (Signed)
Per CY-Patient needs to be seen next week; we can offer Tuesday 05-24-13 at 9 am or Wednesday 05-25-13 at 11:30am. Thanks.

## 2013-05-20 NOTE — Telephone Encounter (Signed)
I spoke with Jennifer Duran and she stated she went to ED yesterday. They did CT chest and was advised her she a nodule left lung and was advised to call our office today regarding this. Jennifer Duran is concerned. Please advise Dr. Maple Hudson thanks Last OV 12/24/12 Pending 06/24/13 --report has been printed off as well

## 2013-05-24 ENCOUNTER — Other Ambulatory Visit: Payer: Self-pay | Admitting: Geriatric Medicine

## 2013-05-24 MED ORDER — LISINOPRIL 10 MG PO TABS: 10.0000 mg | ORAL_TABLET | Freq: Every day | ORAL | Status: DC

## 2013-05-24 MED ORDER — FUROSEMIDE 40 MG PO TABS: 40.0000 mg | ORAL_TABLET | Freq: Every day | ORAL | Status: DC

## 2013-05-24 MED ORDER — CLONAZEPAM 0.5 MG PO TABS: ORAL_TABLET | ORAL | Status: DC

## 2013-05-24 MED ORDER — LISINOPRIL 10 MG PO TABS: ORAL_TABLET | ORAL | Status: DC

## 2013-05-25 ENCOUNTER — Ambulatory Visit (INDEPENDENT_AMBULATORY_CARE_PROVIDER_SITE_OTHER): Payer: PRIVATE HEALTH INSURANCE | Admitting: Ophthalmology

## 2013-05-25 ENCOUNTER — Telehealth: Payer: Self-pay | Admitting: Geriatric Medicine

## 2013-05-25 ENCOUNTER — Ambulatory Visit (INDEPENDENT_AMBULATORY_CARE_PROVIDER_SITE_OTHER): Payer: PRIVATE HEALTH INSURANCE | Admitting: Internal Medicine

## 2013-05-25 ENCOUNTER — Encounter: Payer: Self-pay | Admitting: Internal Medicine

## 2013-05-25 VITALS — BP 118/64 | HR 105 | Ht 62.0 in | Wt 125.8 lb

## 2013-05-25 DIAGNOSIS — R222 Localized swelling, mass and lump, trunk: Secondary | ICD-10-CM

## 2013-05-25 DIAGNOSIS — J449 Chronic obstructive pulmonary disease, unspecified: Secondary | ICD-10-CM

## 2013-05-25 DIAGNOSIS — R918 Other nonspecific abnormal finding of lung field: Secondary | ICD-10-CM

## 2013-05-25 DIAGNOSIS — J441 Chronic obstructive pulmonary disease with (acute) exacerbation: Secondary | ICD-10-CM

## 2013-05-25 MED ORDER — FLUTICASONE-SALMETEROL 250-50 MCG/DOSE IN AEPB: INHALATION_SPRAY | RESPIRATORY_TRACT | Status: DC

## 2013-05-25 MED ORDER — TIOTROPIUM BROMIDE MONOHYDRATE 18 MCG IN CAPS: 18.0000 ug | ORAL_CAPSULE | Freq: Every day | RESPIRATORY_TRACT | Status: DC

## 2013-05-25 NOTE — Patient Instructions (Addendum)
Order- future CXR next ov  Dx COPD, lung mass  Please call as needed

## 2013-05-25 NOTE — Progress Notes (Signed)
Patient ID: Jennifer Duran, female    DOB: 04-Jul-1929, 79 y.o.   MRN: [id number]  HPI 06/24/11- COPD complicated by DM.   Husband and daughter here Last here December 20, 2010- reviewed several labs that visit.  She feels she is doing pretty well. No acute problems since last here. Has stayed in a lot to avoid the Spring pollen and the recent poor air quality and heat. Remains on continuous oxygen at 2 L/M [employer]. Diabetic neuropathic paresthesias can disturb sleep some. She and family wanted to discuss portable concentrator or liquid oxygen as alternative to her portable tanks.   12/26/11- 89 yoF  Former smoker, followed for COPD complicated by DM, HBP. Here w/ husband. Had flu shot. Gradually aware of it easier dyspnea with exertion over the last 6 months. No dramatic events and no recent colds. She does not cough. Has noted a little palpitation at times without chest pain or syncope. She is now using a portable oxygen concentrator-battery lasts for hours-set on 3 L.  06/24/12- 89 yoF  Former smoker, followed for COPD complicated by DM, HBP. Here w/ husband. Doing well overall; unless heat then has to stay in doors as much as possible. She very much likes her portable oxygen concentrator 2 L per minute. When humidity is high she switches to 3 L. Oxygen flow is causing dry epistaxis COPD assessment test (CAT) score 14/40 Finances are tight for medication cost.  12/24/12- 89 yoF  Former smoker, followed for COPD complicated by DM, HBP.      Widowed 6 month follow up.  O2 sat 86% on 2 lpm pulsed upon arrival to exam room.  Reports breathing has gradually worsened since the summer.  Having increased DOE and wheezing at times.  No chest tightness, chest pain, or cough at this time. Breathing got worse when her husband died. She seems to recognize components of deconditioning and grief/depression. Her son and daughter are able to look in and help some. I asked her to consider no longer living  alone. CT chest 12/02/12- IMPRESSION:  1. The area of concern at the right lung base probably represents  superimposed vascular and osseous shadows.  2. However, there are If the patient is at low risk for  bronchogenic carcinoma, follow-up chest CT at 12 months is  recommended. This recommendation follows the consensus statement:  Guidelines for Management of Small Pulmonary Nodules Detected on CT  Scans: A Statement from the Fleischner Society as published in  Radiology 2005; 237:395-400.  3. Severe emphysema.  4. Prominent atherosclerosis.  Original Report Authenticated By: Gaylyn Rong, M.D.   05/25/13- 79 yoF  Former smoker, followed for COPD complicated by DM, HBP.      Widowed FOLLOWS [id number] up visit per CY-seen in ER-CT chest showed nodules. Daughter here is HCPOA Using 3L O2 sleep and exertion, 2L if quiet. Dry cough. No nodes, chest pain or blood. ER 5/24 - woke w/ palpitations.  CT chest changed so she was told to come in: CT chest 05/21/13 IMPRESSION:  Severe emphysematous changes consistent with COPD.  2.6 x 1.4 x 2.5 cm diameter spiculated density in left upper lobe  significantly increased in size since previous study consistent  with a pulmonary neoplasm.  Original Report Authenticated By: Ulyses Southward, M.D.   Review of Systems- see HPI Constitutional:   No-   weight loss, night sweats, fevers, chills, fatigue, lassitude. HEENT:   No-  headaches, difficulty swallowing, tooth/dental problems, sore throat,  No-  sneezing, itching, ear ache, nasal congestion, post nasal drip,  CV:  No-   chest pain, orthopnea, PND, swelling in lower extremities, anasarca, dizziness, +palpitations Resp: +  shortness of breath with exertion, not at rest.              No-   productive cough,  + non-productive cough,  No- coughing up of blood.              No-   change in color of mucus.  No- wheezing.   Skin: No-   rash or lesions. GI:  No-   heartburn, indigestion,  abdominal pain, nausea, vomiting, GU:  MS:  No-   joint pain or swelling.   Neuro-     nothing unusual Psych:  No- change in mood or affect. + depression or anxiety.  No memory loss.  Objective:  BP 118/64  Pulse 105  Ht 5\' 2"  (1.575 m)  Wt 125 lb 12.8 oz (57.063 kg)  BMI 23 kg/m2  SpO2 97% General- Alert, Oriented, Affect-appropriate, Distress- none acute, 2l by her portable concentrator Frail, elderly Skin- rash-none, lesions- none, excoriation- none.  Lymphadenopathy- none Head- atraumatic            Eyes- Gross vision intact, PERRLA, conjunctivae clear secretions            Ears- Hearing, canals-normal            Nose- Clear, no-Septal dev, mucus, polyps, erosion, perforation             Throat- Mallampati II , mucosa clear , drainage- none, tonsils- atrophic Neck- flexible , trachea midline, no stridor , thyroid nl, carotid no bruit Chest - symmetrical excursion , unlabored           Heart/CV- RRR , no murmur , no gallop  , no rub, nl s1 s2                           - JVD- none , edema- none, stasis changes- none, varices- none           Lung- +clear to P&A- very distant, wheeze- none, cough- none , dullness-none, rub- none           Chest wall-  Abd-  Br/ Gen/ Rectal- Not done, not indicated Extrem- cyanosis- none, clubbing, none, atrophy- none, rolling walker. Neuro- grossly intact to observation

## 2013-05-25 NOTE — Telephone Encounter (Signed)
Patient called and stated that she took more clonazepam than prescribed and is out of medication. She was very stressed and insistent that we refill her medication early. She has an appointment on 05/26/13 with Dr. Renato Gails. Shanda Bumps authorized 6 pills just to get the patient through until her appointment with Dr. Renato Gails. Shanda Bumps advised the patient to speak with Dr. Renato Gails about refilling it.

## 2013-05-26 ENCOUNTER — Encounter: Payer: Self-pay | Admitting: Internal Medicine

## 2013-05-26 ENCOUNTER — Ambulatory Visit (INDEPENDENT_AMBULATORY_CARE_PROVIDER_SITE_OTHER): Payer: PRIVATE HEALTH INSURANCE | Admitting: Internal Medicine

## 2013-05-26 VITALS — BP 110/58 | HR 52 | Temp 97.7°F | Resp 13 | Ht 62.0 in | Wt 126.8 lb

## 2013-05-26 DIAGNOSIS — F411 Generalized anxiety disorder: Secondary | ICD-10-CM

## 2013-05-26 DIAGNOSIS — E119 Type 2 diabetes mellitus without complications: Secondary | ICD-10-CM

## 2013-05-26 DIAGNOSIS — J449 Chronic obstructive pulmonary disease, unspecified: Secondary | ICD-10-CM | POA: Insufficient documentation

## 2013-05-26 DIAGNOSIS — I2781 Cor pulmonale (chronic): Secondary | ICD-10-CM | POA: Insufficient documentation

## 2013-05-26 DIAGNOSIS — R222 Localized swelling, mass and lump, trunk: Secondary | ICD-10-CM

## 2013-05-26 DIAGNOSIS — G4700 Insomnia, unspecified: Secondary | ICD-10-CM

## 2013-05-26 DIAGNOSIS — J4489 Other specified chronic obstructive pulmonary disease: Secondary | ICD-10-CM

## 2013-05-26 DIAGNOSIS — R531 Weakness: Secondary | ICD-10-CM

## 2013-05-26 DIAGNOSIS — I279 Pulmonary heart disease, unspecified: Secondary | ICD-10-CM

## 2013-05-26 DIAGNOSIS — R5383 Other fatigue: Secondary | ICD-10-CM

## 2013-05-26 DIAGNOSIS — R269 Unspecified abnormalities of gait and mobility: Secondary | ICD-10-CM

## 2013-05-26 DIAGNOSIS — R5381 Other malaise: Secondary | ICD-10-CM

## 2013-05-26 DIAGNOSIS — R918 Other nonspecific abnormal finding of lung field: Secondary | ICD-10-CM | POA: Insufficient documentation

## 2013-05-26 MED ORDER — CLONAZEPAM 0.5 MG PO TABS: ORAL_TABLET | ORAL | Status: DC

## 2013-05-26 NOTE — Patient Instructions (Signed)
Try melatonin 3mg  a few hours before bedtime for sleep.   I referred you for home health.

## 2013-05-26 NOTE — Progress Notes (Signed)
Patient ID: Jennifer Duran, female   DOB: [birth year], 79 y.o.   MRN: [id number] Code Status: DNR  Allergies  Allergen Reactions  . Celecoxib Swelling  . Codeine   . Metronidazole   . Pregabalin     REACTION: rash/hives  . Pregabalin Swelling  . Propranolol Hcl   . Rofecoxib   . Iodine Rash    Only if ingested    Chief Complaint  Patient presents with  . wants home health referral    saw Dr. Maple Hudson yesterday and patient has a lung mass    HPI: Patient is a 79 y.o. white female seen in the office today for f/u after hospital visit. 2 pencil dots in December, now 2.5 cm mass in lung.  Explains increased dyspnea and weakness.   Would like to be alive to see Ellin's youngest graduate in 2 years.  Would like to get XRT if possible.  Dr. Maple Hudson will see again in 6 wks for f/u xray.  Needs home health nurse to help give her meds.  Daughter has to go back to work.  She is too weak in legs.    Review of Systems:  Review of Systems  Constitutional: Positive for malaise/fatigue. Negative for fever and chills.  Eyes: Negative for blurred vision.  Respiratory: Positive for cough, sputum production and shortness of breath.   Cardiovascular: Negative for chest pain.  Gastrointestinal: Negative for constipation.  Genitourinary: Negative for dysuria.  Musculoskeletal: Negative for myalgias.  Neurological: Positive for weakness. Negative for dizziness and headaches.  Psychiatric/Behavioral: Positive for depression. The patient has insomnia.      Past Medical History  Diagnosis Date  . Allergic rhinitis, cause unspecified   . Unspecified essential hypertension   . Cor pulmonale   . COPD (chronic obstructive pulmonary disease)   . Type II or unspecified type diabetes mellitus without mention of complication, not stated as uncontrolled   . Osteoporosis   . Hashimoto's disease   . Peripheral vascular disease   . Hyperlipidemia   . Depression   . Urinary frequency   . Other atopic  dermatitis and related conditions   . Abnormal involuntary movements(781.0)   . Hyposmolality and/or hyponatremia   . Peripheral vascular disease, unspecified   . Lung mass   . Insomnia    Past Surgical History  Procedure Laterality Date  . Total abdominal hysterectomy    . Cataracts    . Rotator cuff repair    . Mastectomy Right 1952  . Eye surgery     Social History:   reports that she has quit smoking. She has never used smokeless tobacco. She reports that she does not drink alcohol or use illicit drugs.  Family History  Problem Relation Age of Onset  . Cancer Mother     lung   . Lupus Father   . Heart disease Sister     Medications: Patient's Medications  New Prescriptions   No medications on file  Previous Medications   ATORVASTATIN (LIPITOR) 20 MG TABLET    Take 20 mg by mouth daily.   CLOPIDOGREL (PLAVIX) 75 MG TABLET    Take 75 mg by mouth daily.     CLOTRIMAZOLE-BETAMETHASONE (LOTRISONE) LOTION    Apply topically 2 (two) times daily.   FLUTICASONE-SALMETEROL (ADVAIR DISKUS) 250-50 MCG/DOSE AEPB    1 puff then rinse mouth, twice daily   FUROSEMIDE (LASIX) 40 MG TABLET    Take 1 tablet (40 mg total) by mouth daily.   GEMFIBROZIL (LOPID) 600  MG TABLET    Take one tablet by mouth daily for cholesterol.   INSULIN ASPART (NOVOLOG) 100 UNIT/ML INJECTION    Inject 5 Units into the skin 3 (three) times daily before meals.     INSULIN GLARGINE (LANTUS) 100 UNIT/ML INJECTION    Inject 10 Units into the skin at bedtime.     LISINOPRIL (PRINIVIL,ZESTRIL) 10 MG TABLET    Take one half tablet daily.   LISINOPRIL (PRINIVIL,ZESTRIL) 10 MG TABLET    Take 1 tablet (10 mg total) by mouth daily.   RALOXIFENE (EVISTA) 60 MG TABLET    Take 60 mg by mouth daily.     TIOTROPIUM (SPIRIVA HANDIHALER) 18 MCG INHALATION CAPSULE    Place 1 capsule (18 mcg total) into inhaler and inhale daily.   TRAMADOL (ULTRAM) 50 MG TABLET    Two by mouth at bedtime   Modified Medications   Modified  Medication Previous Medication   CLONAZEPAM (KLONOPIN) 0.5 MG TABLET clonazePAM (KLONOPIN) 0.5 MG tablet      Take one tablet twice a day and take two tablets at bedtime for anxiety    Take one tablet twice a day and take two tablets at bedtime for anxiety  Discontinued Medications   No medications on file     Physical Exam:  Filed Vitals:   05/26/13 1610  BP: 110/58  Pulse: 52  Temp: 97.7 F (36.5 C)  TempSrc: Oral  Resp: 13  Height: 5\' 2"  (1.575 m)  Weight: 126 lb 12.8 oz (57.516 kg)  SpO2: 95%  Physical Exam  Constitutional: She is oriented to person, place, and time.  Frail white female in wheelchair, mild dyspnea at rest, on oxygen  HENT:  Head: Normocephalic and atraumatic.  Cardiovascular: Normal rate, regular rhythm, normal heart sounds and intact distal pulses.   Pulmonary/Chest: She is in respiratory distress. She has wheezes.  Slightly increased effort to breathe  Abdominal: Soft. Bowel sounds are normal.  Musculoskeletal: Normal range of motion.  Neurological: She is alert and oriented to person, place, and time.  Skin: Skin is warm and dry.  Psychiatric:  Crying today bc she does not want any chemo and surgery is not an option she was told   Labs reviewed: Basic Metabolic Panel:  Recent Labs  16/10/96 1205 04/28/13 0854 05/03/13 1437 05/19/13 1420  NA 141 143  --  143  K 3.7 3.8  --  3.7  CL 99 100  --  105  CO2 28 25  --  24  GLUCOSE 115* 139*  --  90  BUN 21 13  --  14  CREATININE 1.10 0.99  --  0.80  CALCIUM 9.6 9.6  --  9.9  TSH  --   --  0.680  --    Liver Function Tests:  Recent Labs  01/10/13 1205 04/28/13 0854 05/19/13 1420  AST 28 18 22   ALT 13 15 8   ALKPHOS 67 94 55  BILITOT 0.4 0.3 0.4  PROT 7.4 6.6 6.8  ALBUMIN 3.9  --  3.6  CBC:  Recent Labs  01/10/13 1205 05/03/13 1437 05/19/13 1420  WBC 6.9 8.0 8.9  NEUTROABS 5.1 5.3 6.5  HGB 13.9 11.0* 13.5  HCT 42.3 33.7* 41.1  MCV 87.9 86 87.1  PLT 296  --  364   Past  Procedures: CT thorax:  Severe emphysematous changes consistent with COPD.  2.6 x 1.4 x 2.5 cm diameter spiculated density in left upper lobe  significantly increased in size since  previous study consistent  with a pulmonary neoplasm.  Assessment/Plan C O P D Recently worse and CT revealed lung mass.  Continues chronic oxygen and no changes to nebs or inhalers  Lung mass Is considering radiation therapy for this if she can get it.  Has significant emphysematous disease.  Insomnia Persists.  Advised melatonin 2-3 hours before bed.  Did not tolerate remeron--slept through the next day and missed meals and diabetes medications  Generalized anxiety disorder Clonazepam was refilled today  Generalized weakness Referred for home health to help with med administration, physical therapy for lower extremity strengthening and OT to help her manage her ADLs at home due to her daughter having to go back to work   Labs/tests ordered:  Home health

## 2013-05-28 DIAGNOSIS — R531 Weakness: Secondary | ICD-10-CM | POA: Insufficient documentation

## 2013-05-28 NOTE — Assessment & Plan Note (Signed)
Is considering radiation therapy for this if she can get it.  Has significant emphysematous disease.

## 2013-05-28 NOTE — Assessment & Plan Note (Signed)
Recently worse and CT revealed lung mass.  Continues chronic oxygen and no changes to nebs or inhalers

## 2013-05-28 NOTE — Assessment & Plan Note (Signed)
Referred for home health to help with med administration, physical therapy for lower extremity strengthening and OT to help her manage her ADLs at home due to her daughter having to go back to work

## 2013-05-28 NOTE — Assessment & Plan Note (Signed)
Persists.  Advised melatonin 2-3 hours before bed.  Did not tolerate remeron--slept through the next day and missed meals and diabetes medications

## 2013-05-28 NOTE — Assessment & Plan Note (Signed)
Clonazepam was refilled today

## 2013-06-01 DIAGNOSIS — E119 Type 2 diabetes mellitus without complications: Secondary | ICD-10-CM

## 2013-06-01 DIAGNOSIS — J449 Chronic obstructive pulmonary disease, unspecified: Secondary | ICD-10-CM

## 2013-06-01 DIAGNOSIS — R269 Unspecified abnormalities of gait and mobility: Secondary | ICD-10-CM

## 2013-06-01 DIAGNOSIS — J4489 Other specified chronic obstructive pulmonary disease: Secondary | ICD-10-CM

## 2013-06-02 NOTE — Assessment & Plan Note (Signed)
Severe COPD. Poor candidate for any intervention, and she doesn't want any.

## 2013-06-02 NOTE — Assessment & Plan Note (Addendum)
Discussed with dtr here. Severe emphysema/ COPD. Poor candidate for either guided bronch or needle bx, or resection. Can explore blood test assay for cancer DNA, but may not be worth cost to her. She understands issues and does not want chemo or XRT. I will ask IR opinion about needle. Remains on Plavix.  Hospice is an option.

## 2013-06-03 ENCOUNTER — Telehealth: Payer: Self-pay | Admitting: *Deleted

## 2013-06-03 NOTE — Telephone Encounter (Signed)
Olegario Messier, Nurse with [facility], called and left a message that she had BP reading and Needed a protocol to follow when checking. I returned call and spoke with receptionist and she took the message for nurse to fax over BP readings and request for protocol. She agreed.

## 2013-06-07 ENCOUNTER — Other Ambulatory Visit: Payer: Self-pay

## 2013-06-07 DIAGNOSIS — Z1231 Encounter for screening mammogram for malignant neoplasm of breast: Secondary | ICD-10-CM

## 2013-06-09 ENCOUNTER — Encounter: Payer: Self-pay | Admitting: Internal Medicine

## 2013-06-10 ENCOUNTER — Telehealth: Payer: Self-pay | Admitting: Internal Medicine

## 2013-06-10 NOTE — Telephone Encounter (Signed)
As discussed at our last office visit, I did contact interventional radiology at [facility] to discuss the feasibility of needle biopsy of her lung mass. The radiologist considered pneumothorax almost certain, given her lung disease and the location of this lesion near a fissure.

## 2013-06-11 NOTE — Progress Notes (Signed)
Patient ID: Jennifer Duran, female   DOB: 04/07/1929, 79 y.o.   MRN: [id number] A user error has taken place: encounter opened in error, closed for administrative reasons.

## 2013-06-13 ENCOUNTER — Telehealth: Payer: Self-pay | Admitting: Internal Medicine

## 2013-06-13 NOTE — Telephone Encounter (Signed)
I spoke with pt. She stated [facility] has this machine that can pin point the lump and give the lump radiation. She stated it is very little radiation and it helps diminish the lump. She stated she knows we gave her very little options here. She is wanted to know if we have this option here. Please advise Dr. Maple Hudson thanks

## 2013-06-16 ENCOUNTER — Ambulatory Visit (INDEPENDENT_AMBULATORY_CARE_PROVIDER_SITE_OTHER): Payer: PRIVATE HEALTH INSURANCE | Admitting: Ophthalmology

## 2013-06-16 ENCOUNTER — Other Ambulatory Visit: Payer: Self-pay | Admitting: Internal Medicine

## 2013-06-16 ENCOUNTER — Telehealth: Payer: Self-pay | Admitting: Internal Medicine

## 2013-06-16 DIAGNOSIS — E11319 Type 2 diabetes mellitus with unspecified diabetic retinopathy without macular edema: Secondary | ICD-10-CM

## 2013-06-16 DIAGNOSIS — H43819 Vitreous degeneration, unspecified eye: Secondary | ICD-10-CM

## 2013-06-16 DIAGNOSIS — E1165 Type 2 diabetes mellitus with hyperglycemia: Secondary | ICD-10-CM

## 2013-06-16 DIAGNOSIS — H35039 Hypertensive retinopathy, unspecified eye: Secondary | ICD-10-CM

## 2013-06-16 DIAGNOSIS — R918 Other nonspecific abnormal finding of lung field: Secondary | ICD-10-CM

## 2013-06-16 DIAGNOSIS — I10 Essential (primary) hypertension: Secondary | ICD-10-CM

## 2013-06-16 MED ORDER — CLARITHROMYCIN 250 MG PO TABS: 250.0000 mg | ORAL_TABLET | Freq: Two times a day (BID) | ORAL | Status: DC

## 2013-06-16 NOTE — Telephone Encounter (Signed)
I called her as promised, after Thoracic Oncology conference today. Dr Marian Sorrow Onc, would be willing to consider SBRT focal XRT for her LUL lesion, after a PET scan. I reviewed this and also told her we will start Biaxin and get TB Quant Gold assay.

## 2013-06-16 NOTE — Telephone Encounter (Signed)
See phone note

## 2013-06-17 ENCOUNTER — Other Ambulatory Visit: Payer: PRIVATE HEALTH INSURANCE

## 2013-06-17 ENCOUNTER — Other Ambulatory Visit: Payer: Self-pay | Admitting: Nurse Practitioner

## 2013-06-17 DIAGNOSIS — R918 Other nonspecific abnormal finding of lung field: Secondary | ICD-10-CM

## 2013-06-19 ENCOUNTER — Other Ambulatory Visit: Payer: Self-pay | Admitting: Nurse Practitioner

## 2013-06-20 ENCOUNTER — Encounter (HOSPITAL_COMMUNITY)
Admission: RE | Admit: 2013-06-20 | Discharge: 2013-06-20 | Disposition: A | Discharge status: Home / Self Care | Payer: PRIVATE HEALTH INSURANCE | Source: Ambulatory Visit | Attending: Internal Medicine | Admitting: Internal Medicine

## 2013-06-20 ENCOUNTER — Other Ambulatory Visit: Payer: Self-pay | Admitting: *Deleted

## 2013-06-20 ENCOUNTER — Ambulatory Visit: Payer: PRIVATE HEALTH INSURANCE

## 2013-06-20 DIAGNOSIS — R918 Other nonspecific abnormal finding of lung field: Secondary | ICD-10-CM

## 2013-06-20 DIAGNOSIS — R911 Solitary pulmonary nodule: Secondary | ICD-10-CM | POA: Insufficient documentation

## 2013-06-20 LAB — GLUCOSE, CAPILLARY: Glucose-Capillary: 104 mg/dL — ABNORMAL HIGH (ref 70–99)

## 2013-06-20 LAB — QUANTIFERON TB GOLD ASSAY (BLOOD)
Interferon Gamma Release Assay: NEGATIVE
Mitogen value: 3.58 IU/mL
Quantiferon Nil Value: 0.03 IU/mL
Quantiferon Tb Ag Minus Nil Value: 0 IU/mL
TB Ag value: 0.02 IU/mL

## 2013-06-20 MED ORDER — TRAMADOL HCL 50 MG PO TABS: ORAL_TABLET | ORAL | Status: DC

## 2013-06-20 MED ORDER — FLUDEOXYGLUCOSE F - 18 (FDG) INJECTION
1213.0000 | Freq: Once | INTRAVENOUS | Status: AC | PRN
  Administered 2013-06-20: 1213 via INTRAVENOUS

## 2013-06-21 ENCOUNTER — Other Ambulatory Visit: Payer: PRIVATE HEALTH INSURANCE

## 2013-06-22 ENCOUNTER — Other Ambulatory Visit: Payer: Self-pay | Admitting: Nurse Practitioner

## 2013-06-22 ENCOUNTER — Telehealth: Payer: Self-pay | Admitting: Internal Medicine

## 2013-06-22 NOTE — Progress Notes (Signed)
Quick Note:  Pt aware of results. ______ 

## 2013-06-22 NOTE — Telephone Encounter (Signed)
Result Note    PET- The nodule is hyperactive. This is consistent with a cancer, although an infection might possibly look the same by PET scan.    Pt aware of results and asked why she was on Biaxin; per CY pt is on abx in case this is an infection. Nothing more needed at this time.

## 2013-06-23 ENCOUNTER — Other Ambulatory Visit: Payer: Self-pay | Admitting: *Deleted

## 2013-06-23 NOTE — Progress Notes (Signed)
Quick Note:  Pt aware of results. ______ 

## 2013-06-24 ENCOUNTER — Ambulatory Visit: Payer: PRIVATE HEALTH INSURANCE | Admitting: Internal Medicine

## 2013-06-29 ENCOUNTER — Other Ambulatory Visit: Payer: Self-pay | Admitting: Internal Medicine

## 2013-06-30 ENCOUNTER — Telehealth: Payer: Self-pay | Admitting: Internal Medicine

## 2013-06-30 MED ORDER — FLUTICASONE-SALMETEROL 250-50 MCG/DOSE IN AEPB: INHALATION_SPRAY | RESPIRATORY_TRACT | Status: DC

## 2013-06-30 NOTE — Telephone Encounter (Signed)
Per CDY: ok to give 1 sample of adviar.

## 2013-06-30 NOTE — Telephone Encounter (Signed)
Spoke with patient, patient aware sample will be left upfront for pick up Informed her of our closing time and that we will be closed tomrrow Nothing further needed at this time

## 2013-06-30 NOTE — Telephone Encounter (Signed)
Pt last OV 05/25/13. Please advise if pt can ave sample of advair Dr. Maple Hudson thanks

## 2013-07-06 ENCOUNTER — Ambulatory Visit: Payer: PRIVATE HEALTH INSURANCE

## 2013-07-11 ENCOUNTER — Telehealth: Payer: Self-pay | Admitting: Internal Medicine

## 2013-07-11 MED ORDER — FLUTICASONE-SALMETEROL 250-50 MCG/DOSE IN AEPB: INHALATION_SPRAY | RESPIRATORY_TRACT | Status: DC

## 2013-07-11 NOTE — Telephone Encounter (Signed)
Pt requests samples of advair. Samples at front. Pt is aware. Carron Curie, CMA

## 2013-07-13 ENCOUNTER — Other Ambulatory Visit: Payer: Self-pay | Admitting: Geriatric Medicine

## 2013-07-13 ENCOUNTER — Other Ambulatory Visit: Payer: Self-pay | Admitting: Nurse Practitioner

## 2013-07-13 MED ORDER — CLOPIDOGREL BISULFATE 75 MG PO TABS: 75.0000 mg | ORAL_TABLET | Freq: Every day | ORAL | Status: DC

## 2013-07-15 ENCOUNTER — Ambulatory Visit: Payer: PRIVATE HEALTH INSURANCE | Admitting: Internal Medicine

## 2013-07-15 ENCOUNTER — Emergency Department (HOSPITAL_COMMUNITY)
Admission: EM | Admit: 2013-07-15 | Discharge: 2013-07-15 | Disposition: A | Discharge status: Home / Self Care | Payer: PRIVATE HEALTH INSURANCE | Attending: Emergency Medicine | Admitting: Emergency Medicine

## 2013-07-15 ENCOUNTER — Telehealth: Payer: Self-pay | Admitting: Internal Medicine

## 2013-07-15 ENCOUNTER — Encounter (HOSPITAL_COMMUNITY): Payer: Self-pay | Admitting: Emergency Medicine

## 2013-07-15 ENCOUNTER — Emergency Department (HOSPITAL_COMMUNITY): Payer: PRIVATE HEALTH INSURANCE

## 2013-07-15 DIAGNOSIS — E119 Type 2 diabetes mellitus without complications: Secondary | ICD-10-CM | POA: Insufficient documentation

## 2013-07-15 DIAGNOSIS — F329 Major depressive disorder, single episode, unspecified: Secondary | ICD-10-CM | POA: Insufficient documentation

## 2013-07-15 DIAGNOSIS — Z8679 Personal history of other diseases of the circulatory system: Secondary | ICD-10-CM | POA: Insufficient documentation

## 2013-07-15 DIAGNOSIS — Z794 Long term (current) use of insulin: Secondary | ICD-10-CM | POA: Insufficient documentation

## 2013-07-15 DIAGNOSIS — R531 Weakness: Secondary | ICD-10-CM

## 2013-07-15 DIAGNOSIS — Z87891 Personal history of nicotine dependence: Secondary | ICD-10-CM | POA: Insufficient documentation

## 2013-07-15 DIAGNOSIS — J4489 Other specified chronic obstructive pulmonary disease: Secondary | ICD-10-CM | POA: Insufficient documentation

## 2013-07-15 DIAGNOSIS — M810 Age-related osteoporosis without current pathological fracture: Secondary | ICD-10-CM | POA: Insufficient documentation

## 2013-07-15 DIAGNOSIS — I10 Essential (primary) hypertension: Secondary | ICD-10-CM | POA: Insufficient documentation

## 2013-07-15 DIAGNOSIS — Z862 Personal history of diseases of the blood and blood-forming organs and certain disorders involving the immune mechanism: Secondary | ICD-10-CM | POA: Insufficient documentation

## 2013-07-15 DIAGNOSIS — M6281 Muscle weakness (generalized): Secondary | ICD-10-CM | POA: Insufficient documentation

## 2013-07-15 DIAGNOSIS — R5381 Other malaise: Secondary | ICD-10-CM | POA: Insufficient documentation

## 2013-07-15 DIAGNOSIS — Z9981 Dependence on supplemental oxygen: Secondary | ICD-10-CM | POA: Insufficient documentation

## 2013-07-15 DIAGNOSIS — R222 Localized swelling, mass and lump, trunk: Secondary | ICD-10-CM | POA: Insufficient documentation

## 2013-07-15 DIAGNOSIS — R918 Other nonspecific abnormal finding of lung field: Secondary | ICD-10-CM

## 2013-07-15 DIAGNOSIS — E063 Autoimmune thyroiditis: Secondary | ICD-10-CM | POA: Insufficient documentation

## 2013-07-15 DIAGNOSIS — Z79899 Other long term (current) drug therapy: Secondary | ICD-10-CM | POA: Insufficient documentation

## 2013-07-15 DIAGNOSIS — Z872 Personal history of diseases of the skin and subcutaneous tissue: Secondary | ICD-10-CM | POA: Insufficient documentation

## 2013-07-15 DIAGNOSIS — J449 Chronic obstructive pulmonary disease, unspecified: Secondary | ICD-10-CM | POA: Insufficient documentation

## 2013-07-15 DIAGNOSIS — Z8709 Personal history of other diseases of the respiratory system: Secondary | ICD-10-CM | POA: Insufficient documentation

## 2013-07-15 DIAGNOSIS — E785 Hyperlipidemia, unspecified: Secondary | ICD-10-CM | POA: Insufficient documentation

## 2013-07-15 DIAGNOSIS — G4700 Insomnia, unspecified: Secondary | ICD-10-CM | POA: Insufficient documentation

## 2013-07-15 DIAGNOSIS — Z8639 Personal history of other endocrine, nutritional and metabolic disease: Secondary | ICD-10-CM | POA: Insufficient documentation

## 2013-07-15 DIAGNOSIS — F3289 Other specified depressive episodes: Secondary | ICD-10-CM | POA: Insufficient documentation

## 2013-07-15 LAB — POCT I-STAT, CHEM 8
Chloride: 102 mEq/L (ref 96–112)
HCT: 46 % (ref 36.0–46.0)
Potassium: 3.2 mEq/L — ABNORMAL LOW (ref 3.5–5.1)

## 2013-07-15 LAB — URINALYSIS, ROUTINE W REFLEX MICROSCOPIC
Hgb urine dipstick: NEGATIVE
Protein, ur: NEGATIVE mg/dL
Urobilinogen, UA: 0.2 mg/dL (ref 0.0–1.0)

## 2013-07-15 LAB — CBC
Platelets: 344 10*3/uL (ref 150–400)
RDW: 13.6 % (ref 11.5–15.5)
WBC: 8.9 10*3/uL (ref 4.0–10.5)

## 2013-07-15 LAB — CG4 I-STAT (LACTIC ACID): Lactic Acid, Venous: 1.31 mmol/L (ref 0.5–2.2)

## 2013-07-15 MED ORDER — ONDANSETRON 4 MG PO TBDP: 4.0000 mg | ORAL_TABLET | Freq: Three times a day (TID) | ORAL | Status: DC | PRN

## 2013-07-15 MED ORDER — ONDANSETRON HCL 4 MG/2ML IJ SOLN
4.0000 mg | Freq: Once | INTRAMUSCULAR | Status: AC
  Administered 2013-07-15: 4 mg via INTRAVENOUS
  Filled 2013-07-15: qty 2

## 2013-07-15 MED ORDER — CLONAZEPAM 0.5 MG PO TABS
0.5000 mg | ORAL_TABLET | Freq: Once | ORAL | Status: AC
  Administered 2013-07-15: 0.5 mg via ORAL
  Filled 2013-07-15: qty 1

## 2013-07-15 MED ORDER — PREDNISONE 20 MG PO TABS: 40.0000 mg | ORAL_TABLET | Freq: Every day | ORAL | Status: AC

## 2013-07-15 MED ORDER — METHYLPREDNISOLONE SODIUM SUCC 125 MG IJ SOLR
125.0000 mg | INTRAMUSCULAR | Status: AC
  Administered 2013-07-15: 125 mg via INTRAVENOUS
  Filled 2013-07-15: qty 2

## 2013-07-15 NOTE — Telephone Encounter (Signed)
I spoke with the pt daughter and she states they had to take the pt to the ER today for weakness so they could not come in for OV. They did a cxr and advised no PNA. They wanted to r/s appt so appt set for Monday at 1:45. Carron Curie, CMA

## 2013-07-15 NOTE — ED Notes (Signed)
Per EMS pt reports generalized weakness x 2 days with nausea and loss of appetite. No vomiting or diarrhea. Pt from home. Pt walks with a walker. CBG 125. Home O2 with 3 liters.

## 2013-07-15 NOTE — ED Notes (Signed)
ONG:[id number]<WU> Expected date:<BR> Expected time:<BR> Means of arrival:<BR> Comments:<BR> EMS- weakness

## 2013-07-15 NOTE — ED Provider Notes (Signed)
History    CSN: [id number] Arrival date & time 07/15/13  1307  First MD Initiated Contact with Patient 07/15/13 1318     Chief Complaint  Patient presents with  . Weakness   (Consider location/radiation/quality/duration/timing/severity/associated sxs/prior Treatment) HPI  Patient presents with fatigue. Symptoms began subacutely lastly, progressive days.  There is no new focal pain, lightheadedness, syncope, cough, fever, chills. Patient does have baseline oxygen use for COPD, this is unchanged. There was no clear precipitant, and since onset there is no clearly wheezing or exacerbating factors for her fatigue. Notably, the patient is currently being evaluated for possible lung mass.  Family reports that CT scan done last month demonstrates increased size of the mass since prior imaging earlier this year.  Past Medical History  Diagnosis Date  . Allergic rhinitis, cause unspecified   . Unspecified essential hypertension   . Cor pulmonale   . COPD (chronic obstructive pulmonary disease)   . Type II or unspecified type diabetes mellitus without mention of complication, not stated as uncontrolled   . Osteoporosis   . Hashimoto's disease   . Peripheral vascular disease   . Hyperlipidemia   . Depression   . Urinary frequency   . Other atopic dermatitis and related conditions   . Abnormal involuntary movements(781.0)   . Hyposmolality and/or hyponatremia   . Peripheral vascular disease, unspecified   . Lung mass   . Insomnia    Past Surgical History  Procedure Laterality Date  . Total abdominal hysterectomy    . Cataracts    . Rotator cuff repair    . Mastectomy Right 1952  . Eye surgery     Family History  Problem Relation Age of Onset  . Cancer Mother     lung   . Lupus Father   . Heart disease Sister    History  Substance Use Topics  . Smoking status: Former Smoker -- 2.00 packs/day for 53 years  . Smokeless tobacco: Never Used  . Alcohol Use: No   OB  History   Grav Para Term Preterm Abortions TAB SAB Ect Mult Living                 Review of Systems  Constitutional:       Per HPI, otherwise negative  HENT:       Per HPI, otherwise negative  Respiratory:       Per HPI, otherwise negative  Cardiovascular:       Per HPI, otherwise negative  Gastrointestinal: Negative for vomiting.  Endocrine:       Negative aside from HPI  Genitourinary:       Neg aside from HPI   Musculoskeletal:       Per HPI, otherwise negative  Skin: Positive for pallor.  Neurological: Positive for weakness. Negative for syncope and headaches.    Allergies  Celecoxib; Codeine; Metronidazole; Pregabalin; Propranolol hcl; Rofecoxib; Iodine; and Pregabalin  Home Medications   Current Outpatient Rx  Name  Route  Sig  Dispense  Refill  . albuterol (PROVENTIL HFA;VENTOLIN HFA) 108 (90 BASE) MCG/ACT inhaler   Inhalation   Inhale 2 puffs into the lungs every 6 (six) hours as needed for wheezing or shortness of breath.         Marland Kitchen atorvastatin (LIPITOR) 20 MG tablet   Oral   Take 20 mg by mouth every evening.          . clonazePAM (KLONOPIN) 0.5 MG tablet   Oral   Take 0.5  mg by mouth 3 (three) times daily.         . clopidogrel (PLAVIX) 75 MG tablet   Oral   Take 1 tablet (75 mg total) by mouth daily.   30 tablet   3     No refills available   . Fluticasone-Salmeterol (ADVAIR) 250-50 MCG/DOSE AEPB   Inhalation   Inhale 1 puff into the lungs 2 (two) times daily.         . furosemide (LASIX) 20 MG tablet   Oral   Take 20 mg by mouth every morning.         Marland Kitchen gemfibrozil (LOPID) 600 MG tablet      Take one tablet by mouth daily for cholesterol.   30 tablet   3   . insulin aspart (NOVOLOG) 100 UNIT/ML injection   Subcutaneous   Inject 5 Units into the skin 3 (three) times daily before meals.           . insulin glargine (LANTUS) 100 UNIT/ML injection   Subcutaneous   Inject 10 Units into the skin at bedtime.           Marland Kitchen  lisinopril (PRINIVIL,ZESTRIL) 10 MG tablet   Oral   Take 10 mg by mouth every morning.         . raloxifene (EVISTA) 60 MG tablet   Oral   Take 60 mg by mouth daily.          Marland Kitchen tiotropium (SPIRIVA HANDIHALER) 18 MCG inhalation capsule   Inhalation   Place 1 capsule (18 mcg total) into inhaler and inhale daily.   90 capsule   3   . traMADol (ULTRAM) 50 MG tablet      Take two tablets by mouth at bedtime   60 tablet   3     No refills available    BP 150/68  Pulse 101  Temp(Src) 98.8 F (37.1 C) (Oral)  Resp 18  SpO2 98% Physical Exam  Nursing note and vitals reviewed. Constitutional: She is oriented to person, place, and time. She appears cachectic. She has a sickly appearance.  HENT:  Head: Normocephalic and atraumatic.  Eyes: Conjunctivae and EOM are normal.  Cardiovascular: Normal rate and regular rhythm.   Pulmonary/Chest: Effort normal and breath sounds normal. No stridor. No respiratory distress.  Abdominal: She exhibits no distension.  Musculoskeletal: She exhibits no edema.  Neurological: She is alert and oriented to person, place, and time. No cranial nerve deficit.  Skin: Skin is warm and dry.  Psychiatric: She has a normal mood and affect.    ED Course  Procedures (including critical care time) Labs Reviewed  GLUCOSE, CAPILLARY - Abnormal; Notable for the following:    Glucose-Capillary 142 (*)    All other components within normal limits  POCT I-STAT, CHEM 8 - Abnormal; Notable for the following:    Potassium 3.2 (*)    Glucose, Bld 156 (*)    Calcium, Ion 1.11 (*)    Hemoglobin 15.6 (*)    All other components within normal limits  CBC  URINALYSIS, ROUTINE W REFLEX MICROSCOPIC  CALCIUM, IONIZED  CG4 I-STAT (LACTIC ACID)   No results found. No diagnosis found.  Pulse ox 99% with supplemental oxygen this is abnormal Cardiac 90 sinus rhythm normal EKG has a sinus rhythm, rate 94, nonspecific ST depressions throughout with no significant  changes in 2 months.  This is abnormal  2:54 PM Patient remains in similar condition. I reviewed the prior radiographic studies,  including CT scan last month, PET scan last month.  It is clear that patient likely has an bronchogenic carcinoma.  I discussed the patient's oncologic diagnoses with her daughter.  It seems as though the patient has expressed desire for radiation therapy, but no chemotherapy.  Patient also seems to be having difficulty dealing with the recent loss of her spouse.  The patient's daughter is aware of hospice services, but is not currently enrolled.  This decision will depend on the patient's decision on using any therapy for her oncologic diagnoses. MDM  On repeat exam the patient appears stable.  I discussed the findings with her and her daughter.  The patient states that she prefers to go home, continue to work on her recent oncologic diagnosis as an outpatient.  Absent any fever, opacification on x-ray, leukocytosis, there is low suspicion for infection.  Patient's presentation was consistent with COPD exacerbation versus manifestations of her oncologic process.  Patient was started on steroids, discharge, per her request, to followup with her team early next week.  Gerhard Munch, MD 07/15/13 1539

## 2013-07-16 ENCOUNTER — Telehealth: Payer: Self-pay | Admitting: Geriatric Medicine

## 2013-07-16 DIAGNOSIS — J189 Pneumonia, unspecified organism: Secondary | ICD-10-CM

## 2013-07-16 LAB — CALCIUM, IONIZED: Calcium, Ion: 1.19 mmol/L (ref 1.13–1.30)

## 2013-07-16 MED ORDER — MOXIFLOXACIN HCL 400 MG PO TABS: 400.0000 mg | ORAL_TABLET | Freq: Every day | ORAL | Status: AC

## 2013-07-16 NOTE — Telephone Encounter (Signed)
Phone call from pt's daughter. Pt had ED visit yesterday due to increased SOB, increased fatigue, mild nausea. Lab/CXR unremarkable. Given dose of IV Solu-medrol, sent home. Today, pt is more fatigued, CBG elevated, productive cough, feels chilled but no fever. Symptoms could be related to lung cancer but sounds more like infection today. Will call in Rx (Avelox), recommend BID Mucinex as well. Daughter instructed to call office Monday morning

## 2013-07-18 ENCOUNTER — Ambulatory Visit (INDEPENDENT_AMBULATORY_CARE_PROVIDER_SITE_OTHER): Payer: PRIVATE HEALTH INSURANCE | Admitting: Internal Medicine

## 2013-07-18 ENCOUNTER — Encounter: Payer: Self-pay | Admitting: Internal Medicine

## 2013-07-18 ENCOUNTER — Telehealth: Payer: Self-pay | Admitting: Internal Medicine

## 2013-07-18 VITALS — BP 110/54 | HR 99 | Ht 62.0 in | Wt 122.2 lb

## 2013-07-18 DIAGNOSIS — R918 Other nonspecific abnormal finding of lung field: Secondary | ICD-10-CM

## 2013-07-18 DIAGNOSIS — F411 Generalized anxiety disorder: Secondary | ICD-10-CM

## 2013-07-18 DIAGNOSIS — E876 Hypokalemia: Secondary | ICD-10-CM | POA: Insufficient documentation

## 2013-07-18 DIAGNOSIS — R222 Localized swelling, mass and lump, trunk: Secondary | ICD-10-CM

## 2013-07-18 DIAGNOSIS — R911 Solitary pulmonary nodule: Secondary | ICD-10-CM

## 2013-07-18 NOTE — Assessment & Plan Note (Signed)
CT chest 05/19/13- rapid increase in LUL mass compared with 11/2012 PET 06/22/13- hypermetabolic left upper lobe nodule consistent with cancer, no metastasis At thoracic oncology conference Dr. Roselind Messier had indicated willingness consider SBRT w/o Bx. She would be willing to talk about it. Plan-radiation oncology referral

## 2013-07-18 NOTE — Progress Notes (Signed)
Patient ID: Jennifer Duran, female    DOB: 12/08/1929, 79 y.o.   MRN: [id number]  HPI 03/12/16- COPD complicated by DM.   Husband and daughter here Last here December 20, 2010- reviewed several labs that visit.  She feels she is doing pretty well. No acute problems since last here. Has stayed in a lot to avoid the Spring pollen and the recent poor air quality and heat. Remains on continuous oxygen at 2 L/M [employer]. Diabetic neuropathic paresthesias can disturb sleep some. She and family wanted to discuss portable concentrator or liquid oxygen as alternative to her portable tanks.   12/26/11- 55 yoF  Former smoker, followed for COPD complicated by DM, HBP. Here w/ husband. Had flu shot. Gradually aware of it easier dyspnea with exertion over the last 6 months. No dramatic events and no recent colds. She does not cough. Has noted a little palpitation at times without chest pain or syncope. She is now using a portable oxygen concentrator-battery lasts for hours-set on 3 L.  06/24/12- 82 yoF  Former smoker, followed for COPD complicated by DM, HBP. Here w/ husband. Doing well overall; unless heat then has to stay in doors as much as possible. She very much likes her portable oxygen concentrator 2 L per minute. When humidity is high she switches to 3 L. Oxygen flow is causing dry epistaxis COPD assessment test (CAT) score 14/40 Finances are tight for medication cost.  12/24/12- 76 yoF  Former smoker, followed for COPD complicated by DM, HBP.      Widowed 6 month follow up.  O2 sat 86% on 2 lpm pulsed upon arrival to exam room.  Reports breathing has gradually worsened since the summer.  Having increased DOE and wheezing at times.  No chest tightness, chest pain, or cough at this time. Breathing got worse when her husband died. She seems to recognize components of deconditioning and grief/depression. Her son and daughter are able to look in and help some. I asked her to consider no longer living  alone. CT chest 12/02/12- IMPRESSION:  1. The area of concern at the right lung base probably represents  superimposed vascular and osseous shadows.  2. However, there are If the patient is at low risk for  bronchogenic carcinoma, follow-up chest CT at 12 months is  recommended. This recommendation follows the consensus statement:  Guidelines for Management of Small Pulmonary Nodules Detected on CT  Scans: A Statement from the [employer] as published in  Radiology 2005; 237:395-400.  3. Severe emphysema.  4. Prominent atherosclerosis.  Original Report Authenticated By: Van Clines, M.D.   05/25/13- 73 yoF  Former smoker, followed for COPD complicated by DM, HBP.      Widowed FOLLOWS [facility]:WVPXTG up visit per CY-seen in ER-CT chest showed nodules. Daughter here is HCPOA Using 3L O2 sleep and exertion, 2L if quiet. Dry cough. No nodes, chest pain or blood. ER 5/24 - woke w/ palpitations.  CT chest changed so she was told to come in: CT chest 05/21/13 IMPRESSION:  Severe emphysematous changes consistent with COPD.  2.6 x 1.4 x 2.5 cm diameter spiculated density in left upper lobe  significantly increased in size since previous study consistent  with a pulmonary neoplasm.  Original Report Authenticated By: Lavonia Dana, M.D.  07/18/13- 72 yoF  Former smoker, followed for COPD complicated by DM, HBP.      Widowed Daughter here. At Thoracic Conference 06/16/13 we reviewed images, and Dr Donnamae Jude Onc indicated he would  probably be willing to offer SBRT XRT w/o bx if PET was consistent w/ CA. I called her about this, ordered  Biaxin, and sent Quant TB Assay ( NEG 06/17/13) to eval for possible infection.  ER for weakness 4 days ago. Noted potassium and magnesium low. They gave prednisone. PET 06/22/13- ( after 1 week biaxin) IMPRESSION:  1. Hypermetabolic left upper lobe pulmonary nodule is concerning  for bronchogenic carcinoma. If indeed this is carcinoma, FDG PET  staging T1a  N0 M0  2. No evidence metastasis.  3. Emphysematous change in the lungs  Original Report Authenticated By: Genevive Bi, M.D.  CXR 07/15/13 IMPRESSION:  Emphysema without acute cardiopulmonary findings.  Left upper lobe nodule.  Original Report Authenticated By: Kennith Center, M.D.   Review of Systems- see HPI Constitutional:   No-   weight loss, night sweats, fevers, chills, fatigue, lassitude. HEENT:   No-  headaches, difficulty swallowing, tooth/dental problems, sore throat,       No-  sneezing, itching, ear ache, nasal congestion, post nasal drip,  CV:  No-   chest pain, orthopnea, PND, swelling in lower extremities, anasarca, dizziness, +palpitations Resp: +  shortness of breath with exertion, not at rest.              No-   productive cough,  + non-productive cough,  No- coughing up of blood.              No-   change in color of mucus.  No- wheezing.   Skin: No-   rash or lesions. GI:  No-   heartburn, indigestion, abdominal pain, nausea, vomiting, GU:  MS:  No-   joint pain or swelling.   Neuro-     nothing unusual Psych:  No- change in mood or affect. + depression or anxiety.  No memory loss.  Objective:   General- Alert, Oriented, Affect+depressed, crying intermittently, Distress- none acute,              2l / 95% by her portable concentrator      Frail, elderly, week, wheelchair. Skin- rash-none, lesions- none, excoriation- none.  Lymphadenopathy- none Head- atraumatic            Eyes- Gross vision intact, PERRLA, conjunctivae clear secretions            Ears- Hearing, canals-normal            Nose- Clear, no-Septal dev, mucus, polyps, erosion, perforation             Throat- Mallampati II , mucosa clear , drainage- none, tonsils- atrophic Neck- flexible , trachea midline, no stridor , thyroid nl, carotid no bruit Chest - symmetrical excursion , unlabored           Heart/CV- RRR , no murmur , no gallop  , no rub, nl s1 s2                           - JVD- none ,  edema- none, stasis changes- none, varices- none           Lung- +clear to P&A- very distant, wheeze- none, cough- none , dullness-none, rub- none           Chest wall-  Abd-  Br/ Gen/ Rectal- Not done, not indicated Extrem- cyanosis- none, clubbing, none, atrophy- none, rolling walker. Neuro- grossly intact to observation

## 2013-07-18 NOTE — Assessment & Plan Note (Signed)
Recent emergency room visit for weakness with labs indicating potassium 3.2, low magnesium. Plan-I suggested sugarfree Gatorade and bananas

## 2013-07-18 NOTE — Patient Instructions (Addendum)
Order- referral to Dr Roselind Messier, Radiation Oncology dx left upper lobe nodule, abnormal PET, no biopsy  I will call son Allyse Fregeau [phone], to discuss your situation, as you request.   Please consider taking the antidepressant your doctor gave you. It can help you feel better.

## 2013-07-18 NOTE — Telephone Encounter (Signed)
I called son as patient requested. We discussed her COPD, depression and lung nodule which is PET- positive and consistent with cancer. She will be going to Rad-Onc evaluation. Family is assessing assisted living options.

## 2013-07-18 NOTE — Assessment & Plan Note (Signed)
Anxious and depressed. Crying some at this visit and saying "I wish I were dead". Daughter indicates she has an antidepressant at home which she has not been willing to try.

## 2013-07-19 ENCOUNTER — Other Ambulatory Visit: Payer: Self-pay | Admitting: Nurse Practitioner

## 2013-07-19 MED ORDER — CITALOPRAM HYDROBROMIDE 10 MG PO TABS: 10.0000 mg | ORAL_TABLET | Freq: Every day | ORAL | Status: DC

## 2013-07-19 NOTE — Progress Notes (Signed)
daughter in the office today- worsening anxiety and depression Has taking Celexa and Remeron in the past to help with mood and sleep Celexa helped but she stopped taking for unknown reasons- no side effects noted.  Daughter states that her mother has agreed to start taking again. Will send Rx to pharm. Has follow up appt with me on the 08/10/13

## 2013-07-20 NOTE — Progress Notes (Addendum)
Thoracic Location of Tumor / Histology:  LUL lung  Patient presented  months ago with symptoms of: CT scan results from ED visit  Biopsies of  (if applicable) revealed: biopsy not done as of 07/20/13  Tobacco/Marijuana/Snuff/ETOH use:  Former smoker 2 PPD x 53 yrs, quit 2002, no ETOH  Past/Anticipated interventions by cardiothoracic surgery, if any: none  Past/Anticipated interventions by medical oncology, if any: pt is not candidate for chemo due to comorbidities  Signs/Symptoms  Weight changes, if any: lost 12 lbs past 3 months  Respiratory complaints, if any: infrequent nonprod cough, SOB w exertion, uses O2 @ 2l/min at rest, 3L/min w/sleep and exertion  Hemoptysis, if any: none  Pain issues, if any:  none  SAFETY ISSUES:  Prior radiation? no  Pacemaker/ICD? no  Possible current pregnancy? na  Is the patient on methotrexate? no  Current Complaints / other details:  Widowed, wheelchair, COPD, daughter and grandchildren live w/pt, O2 dependent, on Prednisone, will complete in 2 days, no loss of appetite or fatigue since beginning steroid. Per pt's daughter, pt accidentally doubled up on her BP med yesterday. Advised she check BP prior to taking BP meds tomorrow; daughter and pt verbalized understanding. Pt does have BP machine at home.

## 2013-07-21 ENCOUNTER — Ambulatory Visit
Admission: RE | Admit: 2013-07-21 | Discharge: 2013-07-21 | Disposition: A | Discharge status: Home / Self Care | Payer: PRIVATE HEALTH INSURANCE | Source: Ambulatory Visit | Attending: Radiation Oncology | Admitting: Radiation Oncology

## 2013-07-21 ENCOUNTER — Ambulatory Visit: Payer: PRIVATE HEALTH INSURANCE

## 2013-07-21 ENCOUNTER — Encounter: Payer: Self-pay | Admitting: Radiation Oncology

## 2013-07-21 VITALS — BP 88/58 | HR 95 | Temp 98.2°F | Resp 20 | Ht 62.0 in | Wt 123.2 lb

## 2013-07-21 DIAGNOSIS — C3490 Malignant neoplasm of unspecified part of unspecified bronchus or lung: Secondary | ICD-10-CM | POA: Insufficient documentation

## 2013-07-21 DIAGNOSIS — I10 Essential (primary) hypertension: Secondary | ICD-10-CM | POA: Insufficient documentation

## 2013-07-21 DIAGNOSIS — R918 Other nonspecific abnormal finding of lung field: Secondary | ICD-10-CM

## 2013-07-21 DIAGNOSIS — J449 Chronic obstructive pulmonary disease, unspecified: Secondary | ICD-10-CM | POA: Insufficient documentation

## 2013-07-21 DIAGNOSIS — J4489 Other specified chronic obstructive pulmonary disease: Secondary | ICD-10-CM | POA: Insufficient documentation

## 2013-07-21 DIAGNOSIS — E119 Type 2 diabetes mellitus without complications: Secondary | ICD-10-CM | POA: Insufficient documentation

## 2013-07-21 NOTE — Progress Notes (Signed)
Radiation Oncology         (336) [phone] ________________________________  Initial outpatient Consultation  Name: Jennifer Duran MRN: [id number]  Date: 07/21/2013  DOB: 15-Nov-1929  WU:JWJX, TIFFANY, DO  Young, Rennis Chris, MD   REFERRING PHYSICIAN: Waymon Budge, MD  DIAGNOSIS: PET positive pulmonary nodule presenting in the left upper lobe, clinical stage I  HISTORY OF PRESENT ILLNESS::Jennifer Duran is a 79 y.o. female who is seen out of the courtesy of Dr. Jetty Duhamel for consideration for radiation therapy as part of management of what appears to be a clinical stage I non-small cell lung cancer.   patient has a history of severe COPD who is noted to have increase in a pulmonary nodule in the left upper lobe over the past several months. Patient is not a candidate for fine-needle aspiration of this lesion or bronchoscopy in light of her medical issues.   a PET scan was performed which showed hypermetabolic activity in this lesion but no other abnormal activity noted. Clinical impression is stage I non-small cell lung cancer.  Radiation therapy is been consulted for consideration for definitive treatment without tissue diagnosis.  PREVIOUS RADIATION THERAPY: No  PAST MEDICAL HISTORY:  has a past medical history of Allergic rhinitis, cause unspecified; Unspecified essential hypertension; Cor pulmonale; COPD (chronic obstructive pulmonary disease); Type II or unspecified type diabetes mellitus without mention of complication, not stated as uncontrolled; Osteoporosis; Hashimoto's disease; Peripheral vascular disease; Hyperlipidemia; Depression; Urinary frequency; Other atopic dermatitis and related conditions; Abnormal involuntary movements(781.0); Hyposmolality and/or hyponatremia; Peripheral vascular disease, unspecified; Lung mass; and Insomnia.    PAST SURGICAL HISTORY: Past Surgical History  Procedure Laterality Date  . Total abdominal hysterectomy    . Cataracts    . Rotator  cuff repair    . Mastectomy Right 1952  . Eye surgery      FAMILY HISTORY: family history includes Cancer in her mother; Heart disease in her sister; and Lupus in her father.  SOCIAL HISTORY:  reports that she has quit smoking. She has never used smokeless tobacco. She reports that she does not drink alcohol or use illicit drugs.  ALLERGIES: Celecoxib; Codeine; Metronidazole; Pregabalin; Propranolol hcl; Rofecoxib; Iodine; and Pregabalin  MEDICATIONS:  Current Outpatient Prescriptions  Medication Sig Dispense Refill  . albuterol (PROVENTIL HFA;VENTOLIN HFA) 108 (90 BASE) MCG/ACT inhaler Inhale 2 puffs into the lungs every 6 (six) hours as needed for wheezing or shortness of breath.      Marland Kitchen atorvastatin (LIPITOR) 20 MG tablet Take 20 mg by mouth every evening.       . citalopram (CELEXA) 10 MG tablet Take 1 tablet (10 mg total) by mouth daily.  30 tablet  3  . clonazePAM (KLONOPIN) 0.5 MG tablet Take 0.5 mg by mouth 3 (three) times daily.      . clopidogrel (PLAVIX) 75 MG tablet Take 1 tablet (75 mg total) by mouth daily.  30 tablet  3  . Fluticasone-Salmeterol (ADVAIR) 250-50 MCG/DOSE AEPB Inhale 1 puff into the lungs 2 (two) times daily.      . furosemide (LASIX) 20 MG tablet Take 20 mg by mouth every morning.      Marland Kitchen gemfibrozil (LOPID) 600 MG tablet Take one tablet by mouth daily for cholesterol.  30 tablet  3  . insulin aspart (NOVOLOG) 100 UNIT/ML injection Inject 5 Units into the skin 3 (three) times daily before meals.       . insulin glargine (LANTUS) 100 UNIT/ML injection Inject 10 Units into  the skin at bedtime.        Marland Kitchen lisinopril (PRINIVIL,ZESTRIL) 10 MG tablet Take 10 mg by mouth every morning.      . ondansetron (ZOFRAN ODT) 4 MG disintegrating tablet Take 1 tablet (4 mg total) by mouth every 8 (eight) hours as needed for nausea.  10 tablet  0  . predniSONE (DELTASONE) 1 MG tablet Take 5 mg by mouth daily.      . raloxifene (EVISTA) 60 MG tablet Take 60 mg by mouth daily.         Marland Kitchen tiotropium (SPIRIVA HANDIHALER) 18 MCG inhalation capsule Place 1 capsule (18 mcg total) into inhaler and inhale daily.  90 capsule  3  . traMADol (ULTRAM) 50 MG tablet Take two tablets by mouth at bedtime  60 tablet  3   No current facility-administered medications for this encounter.    REVIEW OF SYSTEMS:  A 15 point review of systems is documented in the electronic medical record. This was obtained by the nursing staff. However, I reviewed this with the patient to discuss relevant findings and make appropriate changes.  She has dyspnea on exertion. She ambulates with the assistance of a walker. She denies any pain in the chest area. She denies any significant cough or hemoptysis. Patient is on 2 L of oxygen continuously.  She denies any new problems with headaches or new bony pain.   PHYSICAL EXAM:  height is 5\' 2"  (1.575 m) and weight is 123 lb 3.2 oz (55.883 kg). Her oral temperature is 98.2 F (36.8 C). Her blood pressure is 88/58 and her pulse is 95. Her respiration is 20 and oxygen saturation is 95%.  This is a very pleasant elderly female sitting in a hospital wheelchair. She is accompanied by her daughter on evaluation today. the patient is alert and responds promptly to questions. She continues to get emotional in discussing her husband who died recently related to complications with Alzheimer's.  The pupils are equal round and reactive to light. The extraocular eye movements are intact. The tongue is midline. Patient has dentures in place. Examination of the neck reveals no evidence of adenopathy. The supraclavicular and axillary areas are free of adenopathy. Examination of the lungs reveals breath sounds to be distant but clear. The heart has a regular rhythm and rate. The abdomen is soft and nontender with normal bowel sounds. On neurological examination motor strength is 5 out of 5 in the proximal and distal muscle groups of  the upper and lower extremities. Peripheral pulses appear to be  adequate. Patient does have some edema and ankle and foot areas bilaterally.  LABORATORY DATA:  Lab Results  Component Value Date   WBC 8.9 07/15/2013   HGB 15.6* 07/15/2013   HCT 46.0 07/15/2013   MCV 86.1 07/15/2013   PLT 344 07/15/2013   Lab Results  Component Value Date   NA 140 07/15/2013   K 3.2* 07/15/2013   CL 102 07/15/2013   CO2 24 05/19/2013   Lab Results  Component Value Date   ALT 8 05/19/2013   AST 22 05/19/2013   ALKPHOS 55 05/19/2013   BILITOT 0.4 05/19/2013     RADIOGRAPHY: Dg Chest 2 View  07/15/2013   *RADIOLOGY REPORT*  Clinical Data: Weakness and 02/08.  Hypermetabolic left upper lobe pulmonary nodule.  CHEST - 2 VIEW  Comparison: PET CT from 06/20/2013.  Chest x-ray from 05/19/2013.  Findings: Left upper lobe pulmonary nodule again identified.  Lungs are hyperexpanded consistent with emphysema.  The cardiopericardial silhouette is within normal limits for size. Imaged bony structures of the thorax are intact. Telemetry leads overlie the chest.  IMPRESSION: Emphysema without acute cardiopulmonary findings.  Left upper lobe nodule.   Original Report Authenticated By: Kennith Center, M.D.      IMPRESSION: Clinical stage I non-small cell lung cancer. As above patient is not a candidate for biopsy in light of her medical condition. Her PET scan is consistent with early stage lung cancer area I discussed options for consideration including watchful waiting and stereotactic body radiotherapy. Patient is not a candidate for surgery in light of her medical condition. The patient does feel comfortable with proceeding with treatment without tissue diagnosis but at this time is on clear whether she will proceed with treatment and will discuss further with her family before making a decision.  PLAN: I have asked the patient to call me when she has made a decision concerning potential radiation therapy  I spent 60 minutes minutes face to face with the patient and more than 50% of that time  was spent in counseling and/or coordination of care.   ------------------------------------------------  -----------------------------------  Billie Lade, PhD, MD

## 2013-07-21 NOTE — Progress Notes (Signed)
Pt circled 9/10 on distress screening, listing breathing as her main concern as well as lack of knowledge about treatment. Pt requests to be called by SW. Discussed pt and her concerns/requests w/Zack Manson Passey, SW who verbalized understanding. Z Manson Passey stated he would call pt.

## 2013-07-21 NOTE — Progress Notes (Signed)
Please see the Nurse Progress Note in the MD Initial Consult Encounter for this patient. 

## 2013-07-22 ENCOUNTER — Telehealth: Payer: Self-pay | Admitting: Internal Medicine

## 2013-07-22 NOTE — Telephone Encounter (Signed)
Received information back from [facility].  [facility] does not accept Covertry ins at this time.  We are presently working with patients daughter w/ medication assistance's.  Trying to get financial assistance's for her medication..[employer] .

## 2013-07-25 ENCOUNTER — Telehealth: Payer: Self-pay | Admitting: *Deleted

## 2013-07-25 NOTE — Progress Notes (Signed)
This encounter was created in error - please disregard.

## 2013-07-25 NOTE — Telephone Encounter (Signed)
Patient called to let Dr.Kinard know she has decided to have rad txs after all, please call her home phone [phone] or cell#[phone] to schedule appt for treatment, will inform Dr.Kinard and Colen Darling, RN, and will have the scheduler call her  12:01 PM

## 2013-07-26 ENCOUNTER — Telehealth: Payer: Self-pay | Admitting: *Deleted

## 2013-07-26 NOTE — Addendum Note (Signed)
Encounter addended by: Glennie Hawk, RN on: 07/26/2013  9:07 AM<BR>     Documentation filed: Charges VN

## 2013-07-26 NOTE — Telephone Encounter (Signed)
Per Dr Roselind Messier, called pt's daughter, Mikeal Hawthorne, [phone] re: pt having ct sim done this week. Daughter states that she would like pt to be scheduled this week. Informed Lenora RT, made Dr Roselind Messier aware to place order for ct sim.

## 2013-07-27 ENCOUNTER — Ambulatory Visit
Admission: RE | Admit: 2013-07-27 | Discharge: 2013-07-27 | Disposition: A | Discharge status: Home / Self Care | Payer: PRIVATE HEALTH INSURANCE | Source: Ambulatory Visit | Attending: Radiation Oncology | Admitting: Radiation Oncology

## 2013-07-27 ENCOUNTER — Ambulatory Visit: Payer: Self-pay | Admitting: Nurse Practitioner

## 2013-07-27 DIAGNOSIS — R031 Nonspecific low blood-pressure reading: Secondary | ICD-10-CM | POA: Insufficient documentation

## 2013-07-27 DIAGNOSIS — Z510 Encounter for antineoplastic radiation therapy: Secondary | ICD-10-CM | POA: Insufficient documentation

## 2013-07-27 DIAGNOSIS — C3490 Malignant neoplasm of unspecified part of unspecified bronchus or lung: Secondary | ICD-10-CM | POA: Insufficient documentation

## 2013-07-27 DIAGNOSIS — R5381 Other malaise: Secondary | ICD-10-CM | POA: Insufficient documentation

## 2013-07-27 DIAGNOSIS — R911 Solitary pulmonary nodule: Secondary | ICD-10-CM | POA: Insufficient documentation

## 2013-07-27 DIAGNOSIS — R918 Other nonspecific abnormal finding of lung field: Secondary | ICD-10-CM

## 2013-07-27 NOTE — Progress Notes (Signed)
  Radiation Oncology         [phone] ________________________________  Name: Jennifer Duran MRN: [id number]  Date: 07/27/2013  DOB: 08-02-1929  RESPIRATORY MOTION MANAGEMENT SIMULATION  NARRATIVE:  In order to account for effect of respiratory motion on target structures and other organs in the planning and delivery of radiotherapy, this patient underwent respiratory motion management simulation.  To accomplish this, when the patient was brought to the CT simulation planning suite, 4D respiratoy motion management CT images were obtained.  The CT images were loaded into the planning software.  Then, using a variety of tools including Cine, MIP, and standard views, the target volume and planning target volumes (PTV) were delineated.  Avoidance structures were contoured.  Treatment planning then occurred.  Dose volume histograms were generated and reviewed for each of the requested structure.  The resulting plan was carefully reviewed and approved today.  -----------------------------------  Billie Lade, PhD, MD

## 2013-07-27 NOTE — Progress Notes (Signed)
  Radiation Oncology         (336) [phone] ________________________________  Name: Jennifer Duran MRN: [id number]  Date: 07/27/2013  DOB: [birth year]  SIMULATION AND TREATMENT PLANNING NOTE  DIAGNOSIS:  PET positive pulmonary nodule presenting in the left upper lobe, clinical stage I  NARRATIVE:  The patient was brought to the CT Simulation planning suite.  Identity was confirmed.  All relevant records and images related to the planned course of therapy were reviewed.  The patient freely provided informed written consent to proceed with treatment after reviewing the details related to the planned course of therapy. The consent form was witnessed and verified by the simulation staff.  Then, the patient was set-up in a stable reproducible  supine position for radiation therapy.  CT images were obtained.  Surface markings were placed.  The CT images were loaded into the planning software.  Then the target and avoidance structures were contoured.  Treatment planning then occurred.  The radiation prescription was entered and confirmed.  Then, I designed and supervised the construction of a total of 2 medically necessary complex treatment devices.  I have requested : 3D Simulation  I have requested a DVH of the following structures: ITV, PTV, lungs, esophagus.  I have ordered: dose calc.  This treatment constitutes a Special Treatment Procedure for the following reason:  High dose per fraction requiring special monitoring for increased toxicities of treatment including daily imaging. ________________________________   PLAN:  The patient will receive 54 Gy in 3 fractions using SBRT techniques.  ________________________________  -----------------------------------  Billie Lade, PhD, MD

## 2013-07-29 HISTORY — PX: RADIATION IMPLANT, FEMALE: SUR1067

## 2013-08-03 ENCOUNTER — Encounter: Payer: Self-pay | Admitting: Geriatric Medicine

## 2013-08-04 ENCOUNTER — Encounter: Payer: Self-pay | Admitting: Geriatric Medicine

## 2013-08-05 NOTE — Addendum Note (Signed)
Encounter addended by: Bernell List on: 08/05/2013 12:26 PM<BR>     Documentation filed: Charges VN

## 2013-08-08 ENCOUNTER — Ambulatory Visit
Admission: RE | Admit: 2013-08-08 | Discharge: 2013-08-08 | Disposition: A | Discharge status: Home / Self Care | Payer: PRIVATE HEALTH INSURANCE | Source: Ambulatory Visit | Attending: Radiation Oncology | Admitting: Radiation Oncology

## 2013-08-08 DIAGNOSIS — R918 Other nonspecific abnormal finding of lung field: Secondary | ICD-10-CM

## 2013-08-08 NOTE — Progress Notes (Signed)
  Radiation Oncology         (336) [phone] ________________________________  Name: Jennifer Duran MRN: [id number]  Date: 08/08/2013  DOB: 09/11/1929  Stereotactic Body Radiotherapy Treatment Procedure Note, fraction #1, 18 Gy  NARRATIVE:  Jennifer Duran was brought to the stereotactic radiation treatment machine and placed supine on the CT couch. The patient was set up for stereotactic body radiotherapy on the body fix pillow.  3D TREATMENT PLANNING AND DOSIMETRY:  The patient's radiation plan was reviewed and approved prior to starting treatment.  It showed 3-dimensional radiation distributions overlaid onto the planning CT.  The [facility] for the target structures as well as the organs at risk were reviewed. The documentation of this is filed in the radiation oncology EMR.  SIMULATION VERIFICATION:  The patient underwent CT imaging on the treatment unit.  These were carefully aligned to document that the ablative radiation dose would cover the target volume and maximally spare the nearby organs at risk according to the planned distribution.  SPECIAL TREATMENT PROCEDURE: Jennifer Duran received high dose ablative stereotactic body radiotherapy to the planned target volume without unforeseen complications. Treatment was delivered uneventfully. The high doses associated with stereotactic body radiotherapy and the significant potential risks require careful treatment set up and patient monitoring constituting a special treatment procedure   STEREOTACTIC TREATMENT MANAGEMENT:  Following delivery, the patient was evaluated clinically. The patient tolerated treatment without significant acute effects, and was discharged to home in stable condition.    PLAN: Continue treatment as planned.  ________________________________  Billie Lade, PhD, MD

## 2013-08-10 ENCOUNTER — Ambulatory Visit (INDEPENDENT_AMBULATORY_CARE_PROVIDER_SITE_OTHER): Payer: PRIVATE HEALTH INSURANCE | Admitting: Nurse Practitioner

## 2013-08-10 ENCOUNTER — Ambulatory Visit
Admission: RE | Admit: 2013-08-10 | Discharge: 2013-08-10 | Disposition: A | Discharge status: Home / Self Care | Payer: PRIVATE HEALTH INSURANCE | Source: Ambulatory Visit | Attending: Radiation Oncology | Admitting: Radiation Oncology

## 2013-08-10 ENCOUNTER — Encounter: Payer: Self-pay | Admitting: Nurse Practitioner

## 2013-08-10 VITALS — BP 90/50 | HR 90 | Temp 97.9°F | Resp 18 | Ht 62.0 in | Wt 126.0 lb

## 2013-08-10 DIAGNOSIS — F329 Major depressive disorder, single episode, unspecified: Secondary | ICD-10-CM

## 2013-08-10 DIAGNOSIS — M810 Age-related osteoporosis without current pathological fracture: Secondary | ICD-10-CM

## 2013-08-10 DIAGNOSIS — R259 Unspecified abnormal involuntary movements: Secondary | ICD-10-CM

## 2013-08-10 DIAGNOSIS — E119 Type 2 diabetes mellitus without complications: Secondary | ICD-10-CM

## 2013-08-10 DIAGNOSIS — F3289 Other specified depressive episodes: Secondary | ICD-10-CM

## 2013-08-10 DIAGNOSIS — E785 Hyperlipidemia, unspecified: Secondary | ICD-10-CM

## 2013-08-10 DIAGNOSIS — R918 Other nonspecific abnormal finding of lung field: Secondary | ICD-10-CM

## 2013-08-10 DIAGNOSIS — R251 Tremor, unspecified: Secondary | ICD-10-CM

## 2013-08-10 DIAGNOSIS — G4700 Insomnia, unspecified: Secondary | ICD-10-CM

## 2013-08-10 MED ORDER — CITALOPRAM HYDROBROMIDE 20 MG PO TABS: 20.0000 mg | ORAL_TABLET | Freq: Every day | ORAL | Status: DC

## 2013-08-10 MED ORDER — PRIMIDONE 50 MG PO TABS: 50.0000 mg | ORAL_TABLET | Freq: Two times a day (BID) | ORAL | Status: DC

## 2013-08-10 NOTE — Progress Notes (Signed)
  Radiation Oncology         [phone] ________________________________  Name: Jennifer Duran MRN: [id number]  Date: 08/10/2013  DOB: 1929/08/01   Simulation verification note  The patient underwent film verification for the patient's set-up in preparation for stereotactic body radiosurgery. The patient was placed on the treatment unit and a CT scan was performed. These images were then fused with the patient's planning CT scan. The fusion was carefully reviewed in terms of the patient's anatomy as it related to the planning CT scan. The target structures as well as the organs at risk were evaluated on the patient's treatment CT scan. The target and the normal structures were appropriately aligned for treatment. Therefore the patient proceeded with the fraction of stereotactic body radiosurgery.  Fraction: 2  Dose:  36 Gy   ________________________________  Radene Gunning, MD, PhD

## 2013-08-10 NOTE — Patient Instructions (Addendum)
Will stop cholesterol medication at this time ( gemfibrozil and Lipitor) Start taking 2 of the 10 mg celexa (20mg ) --refill at the drug store is celexa 20 mg daily  Stop evista  Start taking OTC calcium with vit D

## 2013-08-10 NOTE — Progress Notes (Signed)
Patient ID: Jennifer Duran, female   DOB: [birth year], 78 y.o.   MRN: [id number]   Allergies  Allergen Reactions  . Celecoxib Swelling  . Codeine Nausea And Vomiting  . Metronidazole Other (See Comments)    neuropathy  . Pregabalin Swelling  . Propranolol Hcl Other (See Comments)    Unknown  . Rofecoxib Other (See Comments)    Unknown  . Iodine Rash    Only if ingested  . Pregabalin Hives and Rash    Chief Complaint  Patient presents with  . Follow-up    HPI: Patient is a 79 y.o. female seen in the office today for routine follow up. Since last visit there was a mass found in her chest after routine screening with chest xray by Dr Maple Hudson who is her pulmonary doctor. Had a PET scan which revealed a single spot. She then went to oncology where they decided to start radiation since she could not tolerate a biopsy due to her advanced COPD. Has completed 2/3 of her radiation treatment.  She started taking celexa after her daughter requested her be started back on them at a visit in July. She has tolerated this medication and reports her anxiety and depression has improved.  Not sleeping through the night regularly- has been taking melatonin which helps her fall asleep but only for a few hours.  Tremor is worse- unable to give herself insulin and having trouble getting the insulin pens with insurance Would like to simplify medications at this time. Does not wish to cont cholesterol medication or evista  Review of Systems:  Review of Systems  Constitutional: Negative for fever and chills.  Respiratory: Negative for shortness of breath and wheezing.   Cardiovascular: Negative for chest pain and palpitations.  Gastrointestinal: Negative for heartburn, abdominal pain, diarrhea and constipation.  Genitourinary: Negative for dysuria, urgency and frequency.       Urge incontinence at times   Skin: Negative for itching and rash.  Neurological: Negative for dizziness.  Psychiatric/Behavioral:  Positive for depression. The patient is nervous/anxious and has insomnia.      Past Medical History  Diagnosis Date  . Allergic rhinitis, cause unspecified   . Unspecified essential hypertension   . Cor pulmonale   . COPD (chronic obstructive pulmonary disease)   . Type II or unspecified type diabetes mellitus without mention of complication, not stated as uncontrolled   . Osteoporosis   . Hashimoto's disease   . Peripheral vascular disease   . Hyperlipidemia   . Depression   . Urinary frequency   . Other atopic dermatitis and related conditions   . Abnormal involuntary movements(781.0)   . Hyposmolality and/or hyponatremia   . Peripheral vascular disease, unspecified   . Lung mass   . Insomnia    Past Surgical History  Procedure Laterality Date  . Total abdominal hysterectomy    . Cataracts    . Rotator cuff repair    . Mastectomy Right 1952  . Eye surgery     Social History:   reports that she has quit smoking. She has never used smokeless tobacco. She reports that she does not drink alcohol or use illicit drugs.  Family History  Problem Relation Age of Onset  . Cancer Mother     lung   . Lupus Father   . Heart disease Sister     Medications: Patient's Medications  New Prescriptions   No medications on file  Previous Medications   ALBUTEROL (PROVENTIL HFA;VENTOLIN HFA) 108 (90  BASE) MCG/ACT INHALER    Inhale 2 puffs into the lungs every 6 (six) hours as needed for wheezing or shortness of breath.   ATORVASTATIN (LIPITOR) 20 MG TABLET    Take 20 mg by mouth every evening.    CITALOPRAM (CELEXA) 10 MG TABLET    Take 1 tablet (10 mg total) by mouth daily.   CLONAZEPAM (KLONOPIN) 0.5 MG TABLET    Take 0.5 mg by mouth 3 (three) times daily.   CLOPIDOGREL (PLAVIX) 75 MG TABLET    Take 1 tablet (75 mg total) by mouth daily.   FLUTICASONE-SALMETEROL (ADVAIR) 250-50 MCG/DOSE AEPB    Inhale 1 puff into the lungs 2 (two) times daily.   FUROSEMIDE (LASIX) 20 MG TABLET     Take 20 mg by mouth every morning.   GEMFIBROZIL (LOPID) 600 MG TABLET    Take one tablet by mouth daily for cholesterol.   INSULIN ASPART (NOVOLOG) 100 UNIT/ML INJECTION    Inject 5 Units into the skin 3 (three) times daily before meals.    INSULIN GLARGINE (LANTUS) 100 UNIT/ML INJECTION    Inject 10 Units into the skin at bedtime.     LISINOPRIL (PRINIVIL,ZESTRIL) 10 MG TABLET    Take 10 mg by mouth every morning.   ONDANSETRON (ZOFRAN ODT) 4 MG DISINTEGRATING TABLET    Take 1 tablet (4 mg total) by mouth every 8 (eight) hours as needed for nausea.   RALOXIFENE (EVISTA) 60 MG TABLET    Take 60 mg by mouth daily.    TIOTROPIUM (SPIRIVA HANDIHALER) 18 MCG INHALATION CAPSULE    Place 1 capsule (18 mcg total) into inhaler and inhale daily.   TRAMADOL (ULTRAM) 50 MG TABLET    Take two tablets by mouth at bedtime  Modified Medications   No medications on file  Discontinued Medications   PREDNISONE (DELTASONE) 1 MG TABLET    Take 5 mg by mouth daily.     Physical Exam:  Filed Vitals:   08/10/13 1555  BP: 90/50  Pulse: 90  Temp: 97.9 F (36.6 C)  TempSrc: Oral  Resp: 18  Height: 5\' 2"  (1.575 m)  Weight: 126 lb (57.153 kg)  SpO2: 95%    Physical Exam  Constitutional: She is oriented to person, place, and time. No distress.  Thin female in NAD  HENT:  Head: Normocephalic and atraumatic.  Mouth/Throat: Oropharynx is clear and moist. No oropharyngeal exudate.  Eyes: Conjunctivae are normal. Pupils are equal, round, and reactive to light.  Neck: Normal range of motion. Neck supple. No thyromegaly present.  Cardiovascular: Normal rate, regular rhythm and normal heart sounds.   Pulmonary/Chest: Effort normal and breath sounds normal. No respiratory distress.  Abdominal: Soft. Bowel sounds are normal. She exhibits no distension.  Musculoskeletal: Normal range of motion. She exhibits no edema.  Neurological: She is alert and oriented to person, place, and time. She displays tremor.   Skin: Skin is warm and dry. She is not diaphoretic.  Psychiatric: Affect normal.     Labs reviewed: Basic Metabolic Panel:  Recent Labs  81/19/14 1205 04/28/13 0854 05/03/13 1437 05/19/13 1420 07/15/13 1348  NA 141 143  --  143 140  K 3.7 3.8  --  3.7 3.2*  CL 99 100  --  105 102  CO2 28 25  --  24  --   GLUCOSE 115* 139*  --  90 156*  BUN 21 13  --  14 19  CREATININE 1.10 0.99  --  0.80  1.00  CALCIUM 9.6 9.6  --  9.9  --   TSH  --   --  0.680  --   --    Liver Function Tests:  Recent Labs  01/10/13 1205 04/28/13 0854 05/19/13 1420  AST 28 18 22   ALT 13 15 8   ALKPHOS 67 94 55  BILITOT 0.4 0.3 0.4  PROT 7.4 6.6 6.8  ALBUMIN 3.9  --  3.6   No results found for this basename: LIPASE, AMYLASE,  in the last 8760 hours No results found for this basename: AMMONIA,  in the last 8760 hours CBC:  Recent Labs  01/10/13 1205 05/03/13 1437 05/19/13 1420 07/15/13 1345 07/15/13 1348  WBC 6.9 8.0 8.9 8.9  --   NEUTROABS 5.1 5.3 6.5  --   --   HGB 13.9 11.0* 13.5 14.2 15.6*  HCT 42.3 33.7* 41.1 43.8 46.0  MCV 87.9 86 87.1 86.1  --   PLT 296  --  364 344  --       Assessment/Plan 1. Depression/anxiety  Improved but with anxiety- will increase celexa to 20 mg at this time  - citalopram (CELEXA) 20 MG tablet; Take 1 tablet (20 mg total) by mouth daily.  Dispense: 30 tablet; Refill: 3  2. Insomnia Has been getting better on melatonin; will have her cont this at this time.   3. Osteoporosis, unspecified Has been on evista for over 10 years-- will have her stop this at this time and to take OTC calicum with vit D  4. Type II or unspecified type diabetes mellitus without mention of complication, not stated as uncontrolled Will check blood work for this  - CMP - Hemoglobin A1c  5. Other and unspecified hyperlipidemia Pt would like to stop taking all cholesterol medication at this time. Last lipid panel was at [facility]. We will stop gemfibrozil and statin at this  time.  6. Tremor Essential tremor worsening; taking more klonopin than prescribed due to this. Will start primidone to help with tremor - primidone (MYSOLINE) 50 MG tablet; Take 1 tablet (50 mg total) by mouth 2 (two) times daily.  Dispense: 60 tablet; Refill: 3 7. Lung Mass conts with radiation and to follow up with oncology  TIME PHYSICIAN WITH PATIENT: 45 Time TOTAL:  time greater than 50% of total time spent doing pt counseled and coordination of care regarding medication and plan of care    Will have her follow up in 3-4 months

## 2013-08-11 LAB — COMPREHENSIVE METABOLIC PANEL
ALT: 6 IU/L (ref 0–32)
AST: 20 IU/L (ref 0–40)
Albumin/Globulin Ratio: 1.9 (ref 1.1–2.5)
Alkaline Phosphatase: 60 IU/L (ref 39–117)
BUN/Creatinine Ratio: 13 (ref 11–26)
Calcium: 9 mg/dL (ref 8.6–10.2)
GFR calc non Af Amer: 59 mL/min/{1.73_m2} — ABNORMAL LOW (ref >59)
Potassium: 3.8 mmol/L (ref 3.5–5.2)
Sodium: 141 mmol/L (ref 134–144)
Total Bilirubin: 0.2 mg/dL (ref 0.0–1.2)

## 2013-08-15 ENCOUNTER — Ambulatory Visit: Payer: PRIVATE HEALTH INSURANCE | Admitting: Radiation Oncology

## 2013-08-16 ENCOUNTER — Ambulatory Visit
Admission: RE | Admit: 2013-08-16 | Discharge: 2013-08-16 | Disposition: A | Discharge status: Home / Self Care | Payer: PRIVATE HEALTH INSURANCE | Source: Ambulatory Visit | Attending: Radiation Oncology | Admitting: Radiation Oncology

## 2013-08-16 ENCOUNTER — Encounter: Payer: Self-pay | Admitting: Radiation Oncology

## 2013-08-16 VITALS — BP 86/54 | HR 83 | Temp 98.1°F | Resp 20 | Wt 121.7 lb

## 2013-08-16 DIAGNOSIS — R918 Other nonspecific abnormal finding of lung field: Secondary | ICD-10-CM

## 2013-08-16 NOTE — Progress Notes (Signed)
  Radiation Oncology         [phone] ________________________________  Name: Jennifer Duran MRN: [id number]  Date: 08/16/2013  DOB: [birth year]  End of Treatment Note  Diagnosis:    PET positive pulmonary nodule presenting in the left upper lobe, clinical stage I   Indication for treatment:  Definitive treatment       Radiation treatment dates:   August 11, August 13, August 19  Site/dose:   Left upper lobe pulmonary nodule, 54 Gy in 3 fractions  Beams/energy:   SBRT techniques, VMAT,  6FFF  Narrative: The patient tolerated radiation treatment relatively well.   She did have some fatigue during the last 2 treatments but this was likely related to her other medical issues.   Plan: The patient has completed radiation treatment. The patient will return to radiation oncology clinic for routine followup in one month. I advised them to call or return sooner if they have any questions or concerns related to their recovery or treatment.  -----------------------------------  Billie Lade, PhD, MD

## 2013-08-16 NOTE — Progress Notes (Signed)
  Radiation Oncology         [phone] ________________________________  Name: SHELANDA DUVALL MRN: [id number]  Date: 08/16/2013  DOB: [birth year]  Stereotactic Body Radiotherapy Treatment Procedure Note  NARRATIVE:  MERNA BALDI was brought to the stereotactic radiation treatment machine and placed supine on the CT couch. The patient was set up for stereotactic body radiotherapy on the body fix pillow.  3D TREATMENT PLANNING AND DOSIMETRY:  The patient's radiation plan was reviewed and approved prior to starting treatment.  It showed 3-dimensional radiation distributions overlaid onto the planning CT.  The [facility] for the target structures as well as the organs at risk were reviewed. The documentation of this is filed in the radiation oncology EMR.  SIMULATION VERIFICATION:  The patient underwent CT imaging on the treatment unit.  These were carefully aligned to document that the ablative radiation dose would cover the target volume and maximally spare the nearby organs at risk according to the planned distribution.  SPECIAL TREATMENT PROCEDURE: Jomarie D Zaun received high dose ablative stereotactic body radiotherapy to the planned target volume without unforeseen complications. Treatment was delivered uneventfully. The high doses associated with stereotactic body radiotherapy and the significant potential risks require careful treatment set up and patient monitoring constituting a special treatment procedure   STEREOTACTIC TREATMENT MANAGEMENT:  Following delivery, the patient was evaluated clinically. The patient tolerated treatment without significant acute effects, and was discharged to home in stable condition.    PLAN: Continue treatment as planned.  ________________________________  Billie Lade, PhD, MD

## 2013-08-16 NOTE — Progress Notes (Signed)
Pt c/o weakness, fatigue x 1 week. She states she has hardly gotten out of bed. Pt hypotensive today, has taken Lisinopril this morning. Advised pt and daughter to check pt's BP prior to giving Lisinopril every morning and hold if BP below 100/60. Pt and daughter verbalized understanding. Pt denies pain, cough, does report SOB at times. On continuous O2 @ 2L/min, 3 L/min nightly. Daughter asks how to increase pt's energy. Advised pt eat protein, high calorie foods, drink adequate fluids.  Pt completed today, has FU card.

## 2013-08-16 NOTE — Progress Notes (Signed)
[facility]    Radiation Oncology [address] [place]     Maryln Gottron, M.D. [place], [place] [address]               Billie Lade, M.D., Ph.D. Phone: [phone]      Molli Hazard A. Kathrynn Running, M.D. Fax: [phone]      Radene Gunning, M.D., Ph.D.         Lurline Hare, M.D.         Grayland Jack, M.D Weekly Treatment Management Note  Name: Jennifer Duran     MRN: [id number]        CSN: [id number] Date: 08/16/2013      DOB: 02-19-1929  CC: Bufford Spikes, DO         Reed    Status: Outpatient  Diagnosis: Clinical stage I lung cancer  Current Dose: 54 Gy  Current Fraction: 3  Planned Dose: 54 Gy  Narrative: Lanny Hurst was seen today for weekly treatment management. The chart was checked and CBCT  were reviewed. She has completed her stereotactic body radiotherapy. She does complain of fatigue but her blood pressure has been running low and this may be contributing to the issue. She will address this issue with her primary care physician if this continues to be a problem. She denies any changes in her breathing. She denies any significant cough.  Celecoxib; Codeine; Metronidazole; Pregabalin; Propranolol hcl; Rofecoxib; Iodine; and Pregabalin Current Outpatient Prescriptions  Medication Sig Dispense Refill  . albuterol (PROVENTIL HFA;VENTOLIN HFA) 108 (90 BASE) MCG/ACT inhaler Inhale 2 puffs into the lungs every 6 (six) hours as needed for wheezing or shortness of breath.      . citalopram (CELEXA) 20 MG tablet Take 1 tablet (20 mg total) by mouth daily.  30 tablet  3  . clonazePAM (KLONOPIN) 0.5 MG tablet Take 0.5 mg by mouth 3 (three) times daily.      . clopidogrel (PLAVIX) 75 MG tablet Take 1 tablet (75 mg total) by mouth daily.  30 tablet  3  . Fluticasone-Salmeterol (ADVAIR) 250-50 MCG/DOSE AEPB Inhale 1 puff into the lungs 2 (two) times daily.      . furosemide (LASIX) 20 MG tablet Take 20 mg by mouth every morning.      . insulin aspart (NOVOLOG)  100 UNIT/ML injection Inject 5 Units into the skin 3 (three) times daily before meals.       . insulin glargine (LANTUS) 100 UNIT/ML injection Inject 10 Units into the skin at bedtime.        Marland Kitchen lisinopril (PRINIVIL,ZESTRIL) 10 MG tablet Take 10 mg by mouth every morning.      . ondansetron (ZOFRAN ODT) 4 MG disintegrating tablet Take 1 tablet (4 mg total) by mouth every 8 (eight) hours as needed for nausea.  10 tablet  0  . primidone (MYSOLINE) 50 MG tablet Take 1 tablet (50 mg total) by mouth 2 (two) times daily.  60 tablet  3  . tiotropium (SPIRIVA HANDIHALER) 18 MCG inhalation capsule Place 1 capsule (18 mcg total) into inhaler and inhale daily.  90 capsule  3  . traMADol (ULTRAM) 50 MG tablet Take two tablets by mouth at bedtime  60 tablet  3   No current facility-administered medications for this encounter.   Labs:  Lab Results  Component Value Date   WBC 8.9 07/15/2013   HGB 15.6* 07/15/2013   HCT 46.0 07/15/2013   MCV 86.1 07/15/2013   PLT 344  07/15/2013   Lab Results  Component Value Date   CREATININE 0.91 08/10/2013   BUN 12 08/10/2013   NA 141 08/10/2013   K 3.8 08/10/2013   CL 102 08/10/2013   CO2 27 08/10/2013   Lab Results  Component Value Date   ALT 6 08/10/2013   AST 20 08/10/2013   BILITOT 0.2 08/10/2013    Physical Examination:  weight is 121 lb 11.2 oz (55.203 kg). Her temperature is 98.1 F (36.7 C). Her blood pressure is 86/54 and her pulse is 83. Her respiration is 20 and oxygen saturation is 98%.    Wt Readings from Last 3 Encounters:  08/16/13 121 lb 11.2 oz (55.203 kg)  08/10/13 126 lb (57.153 kg)  07/21/13 123 lb 3.2 oz (55.883 kg)     Lungs - Normal respiratory effort, chest expands symmetrically. Lungs are clear to auscultation, no crackles or wheezes.  Heart has regular rhythm and rate  Abdomen is soft and non tender with normal bowel sounds  Assessment:  Patient tolerated treatments well  Plan: Routine followup in one month

## 2013-08-17 ENCOUNTER — Telehealth: Payer: Self-pay | Admitting: *Deleted

## 2013-08-17 NOTE — Telephone Encounter (Signed)
Patient's Novolog Flex Pen came in the mail from [employer] [phone] [employer] Order # [id number], Customer # [id number], Customer PO # [phone] Patient Notified to pick up

## 2013-08-24 ENCOUNTER — Ambulatory Visit: Payer: PRIVATE HEALTH INSURANCE

## 2013-08-26 ENCOUNTER — Telehealth: Payer: Self-pay | Admitting: Dietician

## 2013-08-26 NOTE — Telephone Encounter (Signed)
Brief Outpatient Oncology Nutrition Note  Patient has been identified to be at risk on malnutrition screen.  Wt Readings from Last 10 Encounters:  08/16/13 121 lb 11.2 oz (55.203 kg)  08/10/13 126 lb (57.153 kg)  07/21/13 123 lb 3.2 oz (55.883 kg)  07/18/13 122 lb 3.2 oz (55.43 kg)  05/26/13 126 lb 12.8 oz (57.516 kg)  05/25/13 125 lb 12.8 oz (57.063 kg)  05/19/13 117 lb (53.071 kg)  05/03/13 117 lb 6.4 oz (53.252 kg)  04/08/13 134 lb 6.4 oz (60.963 kg)  02/23/13 134 lb (60.782 kg)    Called patient due to weight loss.   Patient report variable appetite with usual good intake.  States UBW of 117 lbs but per chart 134 lbs in April of this year.   Patient with a 9% weight loss in the past 4 months.  Encouraged intake of regular meals and snacks usuing Ensure or Boost as needed.  DM with good control with last HgbA1C 6.3.  Encouraged patient to call if she has any nutrition related questions.  Patient reports that daughter is presently taking care of her.  Oran Rein, RD, LDN

## 2013-08-31 ENCOUNTER — Telehealth: Payer: Self-pay

## 2013-08-31 MED ORDER — CITALOPRAM HYDROBROMIDE 40 MG PO TABS: 40.0000 mg | ORAL_TABLET | Freq: Every day | ORAL | Status: DC

## 2013-08-31 NOTE — Telephone Encounter (Signed)
Patient's daughter called indicating: 1.) Primidone is not working, patient needs an alternative 2.) Citalopram is also not working, ? If dose can be increased  Please advise, last OV 08/10/2013

## 2013-08-31 NOTE — Telephone Encounter (Signed)
Spoke with patients daughter, new rx sent for Celexa. Instructions ok'd for primidone. Patient's daughter will monitor patient on increased medications and call if needed

## 2013-08-31 NOTE — Telephone Encounter (Signed)
1) try primidone at 100 mg twice daily to see if this helps better  2) may increase citalopram to 40 mg q day

## 2013-09-09 ENCOUNTER — Telehealth: Payer: Self-pay | Admitting: *Deleted

## 2013-09-09 ENCOUNTER — Other Ambulatory Visit: Payer: Self-pay | Admitting: Nurse Practitioner

## 2013-09-09 NOTE — Telephone Encounter (Signed)
Patient called and left voicemail with Aram Beecham and I returned patient's call. Patient stated that her pharmacy faxed over a refill request for Tramadol yesterday and we didn't respond. I told patient that I did not receive a refill. Patient got mad said she would just call them herself and hung up. Cynthia Notified.

## 2013-09-21 ENCOUNTER — Other Ambulatory Visit: Payer: Self-pay | Admitting: Nurse Practitioner

## 2013-09-22 ENCOUNTER — Encounter: Payer: Self-pay | Admitting: Oncology

## 2013-09-22 ENCOUNTER — Other Ambulatory Visit: Payer: Self-pay | Admitting: *Deleted

## 2013-09-22 MED ORDER — INSULIN PEN NEEDLE 31G X 8 MM MISC: Status: DC

## 2013-09-26 ENCOUNTER — Encounter: Payer: Self-pay | Admitting: Radiation Oncology

## 2013-09-26 ENCOUNTER — Ambulatory Visit
Admission: RE | Admit: 2013-09-26 | Discharge: 2013-09-26 | Disposition: A | Discharge status: Home / Self Care | Payer: PRIVATE HEALTH INSURANCE | Source: Ambulatory Visit | Attending: Radiation Oncology | Admitting: Radiation Oncology

## 2013-09-26 VITALS — BP 108/53 | HR 87 | Temp 98.5°F | Ht 62.0 in | Wt 128.1 lb

## 2013-09-26 DIAGNOSIS — R918 Other nonspecific abnormal finding of lung field: Secondary | ICD-10-CM

## 2013-09-26 NOTE — Progress Notes (Signed)
Radiation Oncology         [phone] ________________________________  Name: Jennifer Duran MRN: [id number]  Date: 09/26/2013  DOB: [birth year]  Follow-Up Visit Note  CC: REED, TIFFANY, DO  Young, Clinton D, MD  Diagnosis:   Clinical stage I lung cancer, PET positive pulmonary nodule  Interval Since Last Radiation:  6  weeks  Narrative:  The patient returns today for routine follow-up.  She did have some fatigue after treatment but this is slowly improving. She denies any changes in her breathing. She continues on 2 L of supplemental oxygen. She has had some intermittent mild coughing which is nonproductive over the past few days. I've asked patient to let me know if this continues or worsens. She denies any chills or fever.                              ALLERGIES:  is allergic to celecoxib; codeine; metronidazole; pregabalin; propranolol hcl; rofecoxib; iodine; and pregabalin.  Meds: Current Outpatient Prescriptions  Medication Sig Dispense Refill  . albuterol (PROVENTIL HFA;VENTOLIN HFA) 108 (90 BASE) MCG/ACT inhaler Inhale 2 puffs into the lungs every 6 (six) hours as needed for wheezing or shortness of breath.      . citalopram (CELEXA) 40 MG tablet Take 1 tablet (40 mg total) by mouth daily.  30 tablet  1  . clonazePAM (KLONOPIN) 0.5 MG tablet Take 0.5 mg by mouth 3 (three) times daily.      . clopidogrel (PLAVIX) 75 MG tablet Take 1 tablet (75 mg total) by mouth daily.  30 tablet  3  . Fluticasone-Salmeterol (ADVAIR) 250-50 MCG/DOSE AEPB Inhale 1 puff into the lungs 2 (two) times daily.      . furosemide (LASIX) 20 MG tablet Take 20 mg by mouth every morning.      . insulin aspart (NOVOLOG) 100 UNIT/ML injection Inject 5 Units into the skin 3 (three) times daily before meals.       . insulin glargine (LANTUS) 100 UNIT/ML injection Inject 10 Units into the skin at bedtime.        . Insulin Pen Needle 31G X 8 MM MISC Check blood sugar four times daily. DX: 250.00 and 401.9  120  each  11  . lisinopril (PRINIVIL,ZESTRIL) 10 MG tablet TAKE 1 TABLET (10 MG TOTAL) BY MOUTH DAILY.  30 tablet  2  . tiotropium (SPIRIVA HANDIHALER) 18 MCG inhalation capsule Place 1 capsule (18 mcg total) into inhaler and inhale daily.  90 capsule  3  . traMADol (ULTRAM) 50 MG tablet Take two tablets by mouth at bedtime  60 tablet  3  . ondansetron (ZOFRAN ODT) 4 MG disintegrating tablet Take 1 tablet (4 mg total) by mouth every 8 (eight) hours as needed for nausea.  10 tablet  0  . primidone (MYSOLINE) 50 MG tablet Take 50 mg by mouth. 2 by mouth 2 times daily       No current facility-administered medications for this encounter.    Physical Findings: The patient is in no acute distress. Patient is alert and oriented.  height is 5\' 2"  (1.575 m) and weight is 128 lb 1.6 oz (58.106 kg). Her temperature is 98.5 F (36.9 C). Her blood pressure is 108/53 and her pulse is 87. Her oxygen saturation is 91%. .  No palpable supraclavicular or axillary adenopathy. Lungs are clear to auscultation. The heart has regular rhythm and rate.  Lab Findings: Lab Results  Component Value Date   WBC 8.9 07/15/2013   HGB 15.6* 07/15/2013   HCT 46.0 07/15/2013   MCV 86.1 07/15/2013   PLT 344 07/15/2013      Radiographic Findings: No results found.  Impression:  The patient is recovering from the effects of radiation.   Plan:  She will return in late November or early December for evaluation. She will be scheduled for a chest CT scan at that time to assess her response to therapy.  _____________________________________  -----------------------------------  Billie Lade, PhD, MD

## 2013-09-26 NOTE — Progress Notes (Addendum)
Linus Orn here in a wheelchair with her daughter for follow up after treatment to her left upper lobe.  She denies pain.  She reports that she started to cough two days ago and is bringing up whitish sputum.  She said her grandson had bronchitis and she may have picked it up from him.  She has shortness of breath with sudden activity.  She is currently on 2l of oxygen with an oxygen saturation of 91%.  She reports that she did experience weakness from treatment which has  has improved except that she is still weak in her legs.  She will be moving to an assisted living facility in November.  Her skin is intact in the treatment area.

## 2013-10-11 ENCOUNTER — Other Ambulatory Visit: Payer: Self-pay | Admitting: *Deleted

## 2013-10-11 MED ORDER — TRAMADOL HCL 50 MG PO TABS: ORAL_TABLET | ORAL | Status: DC

## 2013-10-28 ENCOUNTER — Other Ambulatory Visit: Payer: Self-pay | Admitting: Internal Medicine

## 2013-11-02 ENCOUNTER — Emergency Department (HOSPITAL_BASED_OUTPATIENT_CLINIC_OR_DEPARTMENT_OTHER)
Admission: EM | Admit: 2013-11-02 | Discharge: 2013-11-02 | Disposition: A | Discharge status: Home / Self Care | Payer: Medicare Other | Attending: Emergency Medicine | Admitting: Emergency Medicine

## 2013-11-02 ENCOUNTER — Encounter (HOSPITAL_BASED_OUTPATIENT_CLINIC_OR_DEPARTMENT_OTHER): Payer: Self-pay | Admitting: Emergency Medicine

## 2013-11-02 DIAGNOSIS — E860 Dehydration: Secondary | ICD-10-CM | POA: Insufficient documentation

## 2013-11-02 DIAGNOSIS — J4489 Other specified chronic obstructive pulmonary disease: Secondary | ICD-10-CM | POA: Insufficient documentation

## 2013-11-02 DIAGNOSIS — I739 Peripheral vascular disease, unspecified: Secondary | ICD-10-CM | POA: Insufficient documentation

## 2013-11-02 DIAGNOSIS — Z923 Personal history of irradiation: Secondary | ICD-10-CM | POA: Insufficient documentation

## 2013-11-02 DIAGNOSIS — Z7902 Long term (current) use of antithrombotics/antiplatelets: Secondary | ICD-10-CM | POA: Insufficient documentation

## 2013-11-02 DIAGNOSIS — J449 Chronic obstructive pulmonary disease, unspecified: Secondary | ICD-10-CM | POA: Insufficient documentation

## 2013-11-02 DIAGNOSIS — E785 Hyperlipidemia, unspecified: Secondary | ICD-10-CM | POA: Insufficient documentation

## 2013-11-02 DIAGNOSIS — Z872 Personal history of diseases of the skin and subcutaneous tissue: Secondary | ICD-10-CM | POA: Insufficient documentation

## 2013-11-02 DIAGNOSIS — I10 Essential (primary) hypertension: Secondary | ICD-10-CM | POA: Insufficient documentation

## 2013-11-02 DIAGNOSIS — Z794 Long term (current) use of insulin: Secondary | ICD-10-CM | POA: Insufficient documentation

## 2013-11-02 DIAGNOSIS — IMO0002 Reserved for concepts with insufficient information to code with codable children: Secondary | ICD-10-CM | POA: Insufficient documentation

## 2013-11-02 DIAGNOSIS — E119 Type 2 diabetes mellitus without complications: Secondary | ICD-10-CM | POA: Insufficient documentation

## 2013-11-02 DIAGNOSIS — F329 Major depressive disorder, single episode, unspecified: Secondary | ICD-10-CM | POA: Insufficient documentation

## 2013-11-02 DIAGNOSIS — Z79899 Other long term (current) drug therapy: Secondary | ICD-10-CM | POA: Insufficient documentation

## 2013-11-02 DIAGNOSIS — F3289 Other specified depressive episodes: Secondary | ICD-10-CM | POA: Insufficient documentation

## 2013-11-02 DIAGNOSIS — Z8739 Personal history of other diseases of the musculoskeletal system and connective tissue: Secondary | ICD-10-CM | POA: Insufficient documentation

## 2013-11-02 DIAGNOSIS — Z87891 Personal history of nicotine dependence: Secondary | ICD-10-CM | POA: Insufficient documentation

## 2013-11-02 LAB — URINALYSIS, ROUTINE W REFLEX MICROSCOPIC
Ketones, ur: >80 mg/dL — AB
Leukocytes, UA: NEGATIVE
Protein, ur: 30 mg/dL — AB
Urobilinogen, UA: 1 mg/dL (ref 0.0–1.0)

## 2013-11-02 LAB — COMPREHENSIVE METABOLIC PANEL
BUN: 15 mg/dL (ref 6–23)
CO2: 28 mEq/L (ref 19–32)
Chloride: 100 mEq/L (ref 96–112)
Creatinine, Ser: 0.9 mg/dL (ref 0.50–1.10)
GFR calc non Af Amer: 58 mL/min — ABNORMAL LOW (ref >90)
Glucose, Bld: 98 mg/dL (ref 70–99)
Sodium: 142 mEq/L (ref 135–145)
Total Bilirubin: 0.6 mg/dL (ref 0.3–1.2)

## 2013-11-02 LAB — CBC WITH DIFFERENTIAL/PLATELET
HCT: 40.7 % (ref 36.0–46.0)
Hemoglobin: 12.8 g/dL (ref 12.0–15.0)
Lymphocytes Relative: 14 % (ref 12–46)
Lymphs Abs: 1.2 10*3/uL (ref 0.7–4.0)
MCHC: 31.4 g/dL (ref 30.0–36.0)
Monocytes Absolute: 0.6 10*3/uL (ref 0.1–1.0)
Monocytes Relative: 7 % (ref 3–12)
Neutro Abs: 6.4 10*3/uL (ref 1.7–7.7)
Neutrophils Relative %: 78 % — ABNORMAL HIGH (ref 43–77)
RBC: 4.67 MIL/uL (ref 3.87–5.11)
WBC: 8.3 10*3/uL (ref 4.0–10.5)

## 2013-11-02 LAB — URINE MICROSCOPIC-ADD ON

## 2013-11-02 LAB — MAGNESIUM: Magnesium: 2.1 mg/dL (ref 1.5–2.5)

## 2013-11-02 MED ORDER — ONDANSETRON HCL 4 MG/2ML IJ SOLN
4.0000 mg | Freq: Once | INTRAMUSCULAR | Status: AC
  Administered 2013-11-02: 4 mg via INTRAVENOUS
  Filled 2013-11-02: qty 2

## 2013-11-02 MED ORDER — FENTANYL CITRATE 0.05 MG/ML IJ SOLN
25.0000 ug | Freq: Once | INTRAMUSCULAR | Status: AC
  Administered 2013-11-02: 25 ug via INTRAVENOUS
  Filled 2013-11-02: qty 2

## 2013-11-02 MED ORDER — SODIUM CHLORIDE 0.9 % IV BOLUS (SEPSIS)
1000.0000 mL | Freq: Once | INTRAVENOUS | Status: AC
  Administered 2013-11-02: 1000 mL via INTRAVENOUS

## 2013-11-02 NOTE — ED Notes (Signed)
MD notified of pts inability to void.  MD ok to wait for a couple hours to see if pt will be able to void after receiving fluids.

## 2013-11-02 NOTE — ED Notes (Signed)
MD at bedside. 

## 2013-11-02 NOTE — ED Provider Notes (Signed)
TIME SEEN: 3:36 PM  CHIEF COMPLAINT: Dehydration, diffuse body aches  HPI: Patient is a 79 y.o. female with a history of COPD on 2 L of oxygen chronically, type 2 diabetes, hyperlipidemia, peripheral vascular disease, hypothyroidism, lung nodule status post radiation in August 2014 who presents emergency department with complaints of feeling dehydrated and having diffuse body aches and cramping.  She states that she frequently becomes dehydrated as she is unable to drink enough fluid in the daytime. She noticed that the past several days she felt dehydrated and began to drink lots of fluid. She states that she last urinated last night at 10 PM and then did not urinate again until approximately 1 PM this afternoon. She began having cramps in her bilateral lower extremities, soreness diffusely across her abdomen and cramping in her back. She denies any fevers or chills. No chest pain or shortness of breath. No cough. She's had nausea but no vomiting or diarrhea. No bloody stools or melena. No numbness, tingling or focal weakness.  Patient states that her daughter is also here in the emergency department to be evaluated for lower back pain and migraine headache. She states "I figured since she was coming, I would come to get checked out myself".  ROS: See HPI Constitutional: no fever  Eyes: no drainage  ENT: no runny nose   Cardiovascular:  no chest pain  Resp: no SOB  GI: no vomiting GU: no dysuria Integumentary: no rash  Allergy: no hives  Musculoskeletal: no leg swelling  Neurological: no slurred speech ROS otherwise negative  PAST MEDICAL HISTORY/PAST SURGICAL HISTORY:  Past Medical History  Diagnosis Date  . Allergic rhinitis, cause unspecified   . Unspecified essential hypertension   . Cor pulmonale   . COPD (chronic obstructive pulmonary disease)   . Type II or unspecified type diabetes mellitus without mention of complication, not stated as uncontrolled   . Osteoporosis   .  Hashimoto's disease   . Peripheral vascular disease   . Hyperlipidemia   . Depression   . Urinary frequency   . Other atopic dermatitis and related conditions   . Abnormal involuntary movements(781.0)   . Hyposmolality and/or hyponatremia   . Peripheral vascular disease, unspecified   . Lung mass   . Insomnia   . History of radiation therapy 08/08/2013, 08/10/2013, 08/16/2013    SBRT 54 Gy to left upper lobe pulmonary nodule    MEDICATIONS:  Prior to Admission medications   Medication Sig Start Date End Date Taking? Authorizing Provider  albuterol (PROVENTIL HFA;VENTOLIN HFA) 108 (90 BASE) MCG/ACT inhaler Inhale 2 puffs into the lungs every 6 (six) hours as needed for wheezing or shortness of breath.    Historical Provider, MD  citalopram (CELEXA) 40 MG tablet Take 1 tablet (40 mg total) by mouth daily. 08/31/13   Claudie Revering, NP  clonazePAM (KLONOPIN) 0.5 MG tablet Take 0.5 mg by mouth 3 (three) times daily.    Historical Provider, MD  clopidogrel (PLAVIX) 75 MG tablet Take 1 tablet (75 mg total) by mouth daily. 07/13/13   Kimber Relic, MD  Fluticasone-Salmeterol (ADVAIR) 250-50 MCG/DOSE AEPB Inhale 1 puff into the lungs 2 (two) times daily.    Historical Provider, MD  furosemide (LASIX) 20 MG tablet Take 20 mg by mouth every morning.    Historical Provider, MD  insulin aspart (NOVOLOG) 100 UNIT/ML injection Inject 5 Units into the skin 3 (three) times daily before meals.     Historical Provider, MD  insulin glargine (  LANTUS) 100 UNIT/ML injection Inject 10 Units into the skin at bedtime.      Historical Provider, MD  Insulin Pen Needle 31G X 8 MM MISC Check blood sugar four times daily. DX: 250.00 and 401.9 09/22/13   Claudie Revering, NP  lisinopril (PRINIVIL,ZESTRIL) 10 MG tablet TAKE 1 TABLET (10 MG TOTAL) BY MOUTH DAILY. 09/21/13   Claudie Revering, NP  ondansetron (ZOFRAN ODT) 4 MG disintegrating tablet Take 1 tablet (4 mg total) by mouth every 8 (eight) hours as needed for nausea.  07/15/13   Gerhard Munch, MD  ONE TOUCH ULTRA TEST test strip TEST BLOOD SUGAR TWO TIMES DAILY 10/28/13   Oneal Grout, MD  primidone (MYSOLINE) 50 MG tablet Take 50 mg by mouth. 2 by mouth 2 times daily 08/10/13   Claudie Revering, NP  tiotropium (SPIRIVA HANDIHALER) 18 MCG inhalation capsule Place 1 capsule (18 mcg total) into inhaler and inhale daily. 05/25/13 05/25/14  Waymon Budge, MD  traMADol Janean Sark) 50 MG tablet Take two tablets by mouth at bedtime 10/11/13   Claudie Revering, NP    ALLERGIES:  Allergies  Allergen Reactions  . Celecoxib Swelling  . Codeine Nausea And Vomiting  . Metronidazole Other (See Comments)    neuropathy  . Pregabalin Swelling  . Propranolol Hcl Other (See Comments)    Unknown  . Rofecoxib Other (See Comments)    Unknown  . Iodine Rash    Only if ingested  . Pregabalin Hives and Rash    SOCIAL HISTORY:  History  Substance Use Topics  . Smoking status: Former Smoker -- 2.00 packs/day for 53 years  . Smokeless tobacco: Never Used  . Alcohol Use: No    FAMILY HISTORY: Family History  Problem Relation Age of Onset  . Cancer Mother     lung   . Lupus Father   . Heart disease Sister     EXAM: BP 159/65  Pulse 94  Temp(Src) 98.5 F (36.9 C) (Oral)  Resp 24  Ht 5\' 3"  (1.6 m)  Wt 110 lb (49.896 kg)  BMI 19.49 kg/m2  SpO2 97% CONSTITUTIONAL: Alert and oriented and responds appropriately to questions. Well-appearing; well-nourished HEAD: Normocephalic EYES: Conjunctivae clear, PERRL ENT: normal nose; no rhinorrhea; dry mucous membranes; pharynx without lesions noted NECK: Supple, no meningismus, no LAD  CARD: RRR; S1 and S2 appreciated; no murmurs, no clicks, no rubs, no gallops RESP: Normal chest excursion without splinting or tachypnea; breath sounds clear and equal bilaterally; no wheezes, no rhonchi, no rales,  ABD/GI: Normal bowel sounds; non-distended; soft, non-tender, no rebound, no guarding BACK:  The back appears normal and is  non-tender to palpation, there is no CVA tenderness EXT: Normal ROM in all joints; non-tender to palpation; no edema; normal capillary refill; no cyanosis    SKIN: Normal color for age and race; warm NEURO: Moves all extremities equally; cranial nerves II through XII intact, sensation to light touch intact diffusely PSYCH: The patient's mood and manner are appropriate. Grooming and personal hygiene are appropriate.  MEDICAL DECISION MAKING: Patient here with diffuse body aches and dehydration. She is hemodynamically stable. Her exam is benign. We'll check basic labs, CK, electrolytes, urinalysis. Will give IV fluids, pain medication and anti-emetics.  If workup unremarkable and patient is still well-appearing, feels better, anticipate discharge home with close outpatient followup.  ED PROGRESS: Patient's labs are completely normal. She is receiving IV fluids.  She has been able to drink without difficulty. Urine pending. She reports  her pain is improved after fentanyl.   Urine shows no sign of infection. We'll discharge home after IV fluids complete. Patient is comfortable with this plan. He reports she is feeling better. We'll have her followup with her primary care physician in the next several days. Given strict return precautions. Patient verbalizes understanding and is comfortable with plan.  Layla Maw Juanya Villavicencio, DO 11/02/13 1737

## 2013-11-02 NOTE — ED Notes (Signed)
C/o pain "everywhere-my legs, my back,my stomach" x 2 days

## 2013-11-03 ENCOUNTER — Other Ambulatory Visit: Payer: Self-pay

## 2013-11-04 ENCOUNTER — Other Ambulatory Visit: Payer: Self-pay | Admitting: *Deleted

## 2013-11-11 ENCOUNTER — Other Ambulatory Visit: Payer: Self-pay | Admitting: *Deleted

## 2013-11-11 ENCOUNTER — Encounter: Payer: PRIVATE HEALTH INSURANCE | Admitting: Internal Medicine

## 2013-11-11 ENCOUNTER — Other Ambulatory Visit: Payer: Self-pay | Admitting: Nurse Practitioner

## 2013-11-11 ENCOUNTER — Telehealth: Payer: Self-pay | Admitting: Internal Medicine

## 2013-11-11 MED ORDER — CITALOPRAM HYDROBROMIDE 40 MG PO TABS: ORAL_TABLET | ORAL | Status: DC

## 2013-11-11 MED ORDER — TRAMADOL HCL 50 MG PO TABS: ORAL_TABLET | ORAL | Status: DC

## 2013-11-11 MED ORDER — CLONAZEPAM 0.5 MG PO TABS: ORAL_TABLET | ORAL | Status: DC

## 2013-11-11 NOTE — Telephone Encounter (Signed)
Pt

## 2013-11-11 NOTE — Telephone Encounter (Signed)
Pt Called, requested medication refills.  Inform Mrs. Teasdale to contact her pharmacy.  Says she would do so. Pt also, says she is on Primdone 50mp, she wants to switch back to Clonazepam, 0.5mg  says the Primdone is not helping with her trimmers.  Told pt I would send a message to Dr. Renato Gails and get back w/ her Soon. cdavis

## 2013-11-16 ENCOUNTER — Ambulatory Visit: Payer: Medicare HMO | Admitting: Nurse Practitioner

## 2013-11-18 ENCOUNTER — Encounter: Payer: Self-pay | Admitting: Internal Medicine

## 2013-11-18 ENCOUNTER — Ambulatory Visit (INDEPENDENT_AMBULATORY_CARE_PROVIDER_SITE_OTHER): Payer: Medicare Other | Admitting: Internal Medicine

## 2013-11-18 ENCOUNTER — Ambulatory Visit (INDEPENDENT_AMBULATORY_CARE_PROVIDER_SITE_OTHER)
Admission: RE | Admit: 2013-11-18 | Discharge: 2013-11-18 | Disposition: A | Discharge status: Home / Self Care | Payer: Medicare Other | Source: Ambulatory Visit | Attending: Internal Medicine | Admitting: Internal Medicine

## 2013-11-18 VITALS — BP 118/68 | HR 88 | Ht 62.0 in | Wt 124.0 lb

## 2013-11-18 DIAGNOSIS — Z23 Encounter for immunization: Secondary | ICD-10-CM

## 2013-11-18 DIAGNOSIS — R911 Solitary pulmonary nodule: Secondary | ICD-10-CM

## 2013-11-18 DIAGNOSIS — J439 Emphysema, unspecified: Secondary | ICD-10-CM

## 2013-11-18 DIAGNOSIS — J449 Chronic obstructive pulmonary disease, unspecified: Secondary | ICD-10-CM

## 2013-11-18 DIAGNOSIS — J438 Other emphysema: Secondary | ICD-10-CM

## 2013-11-18 MED ORDER — ALBUTEROL SULFATE HFA 108 (90 BASE) MCG/ACT IN AERS: 2.0000 | INHALATION_SPRAY | RESPIRATORY_TRACT | Status: AC | PRN

## 2013-11-18 NOTE — Patient Instructions (Addendum)
Script sent refilling albuterol rescue inhaler  Order CXR  Dx COPD, lung nodule  Pneumonia vaccine conjugate 13  Please call as needed

## 2013-11-18 NOTE — Progress Notes (Signed)
Patient ID: Jennifer Duran, female    DOB: 12/08/1929, 79 y.o.   MRN: [id number]  HPI 03/12/16- COPD complicated by DM.   Husband and daughter here Last here December 20, 2010- reviewed several labs that visit.  She feels she is doing pretty well. No acute problems since last here. Has stayed in a lot to avoid the Spring pollen and the recent poor air quality and heat. Remains on continuous oxygen at 2 L/M [employer]. Diabetic neuropathic paresthesias can disturb sleep some. She and family wanted to discuss portable concentrator or liquid oxygen as alternative to her portable tanks.   12/26/11- 55 yoF  Former smoker, followed for COPD complicated by DM, HBP. Here w/ husband. Had flu shot. Gradually aware of it easier dyspnea with exertion over the last 6 months. No dramatic events and no recent colds. She does not cough. Has noted a little palpitation at times without chest pain or syncope. She is now using a portable oxygen concentrator-battery lasts for hours-set on 3 L.  06/24/12- 82 yoF  Former smoker, followed for COPD complicated by DM, HBP. Here w/ husband. Doing well overall; unless heat then has to stay in doors as much as possible. She very much likes her portable oxygen concentrator 2 L per minute. When humidity is high she switches to 3 L. Oxygen flow is causing dry epistaxis COPD assessment test (CAT) score 14/40 Finances are tight for medication cost.  12/24/12- 76 yoF  Former smoker, followed for COPD complicated by DM, HBP.      Widowed 6 month follow up.  O2 sat 86% on 2 lpm pulsed upon arrival to exam room.  Reports breathing has gradually worsened since the summer.  Having increased DOE and wheezing at times.  No chest tightness, chest pain, or cough at this time. Breathing got worse when her husband died. She seems to recognize components of deconditioning and grief/depression. Her son and daughter are able to look in and help some. I asked her to consider no longer living  alone. CT chest 12/02/12- IMPRESSION:  1. The area of concern at the right lung base probably represents  superimposed vascular and osseous shadows.  2. However, there are If the patient is at low risk for  bronchogenic carcinoma, follow-up chest CT at 12 months is  recommended. This recommendation follows the consensus statement:  Guidelines for Management of Small Pulmonary Nodules Detected on CT  Scans: A Statement from the [employer] as published in  Radiology 2005; 237:395-400.  3. Severe emphysema.  4. Prominent atherosclerosis.  Original Report Authenticated By: Van Clines, M.D.   05/25/13- 73 yoF  Former smoker, followed for COPD complicated by DM, HBP.      Widowed FOLLOWS [facility]:WVPXTG up visit per CY-seen in ER-CT chest showed nodules. Daughter here is HCPOA Using 3L O2 sleep and exertion, 2L if quiet. Dry cough. No nodes, chest pain or blood. ER 5/24 - woke w/ palpitations.  CT chest changed so she was told to come in: CT chest 05/21/13 IMPRESSION:  Severe emphysematous changes consistent with COPD.  2.6 x 1.4 x 2.5 cm diameter spiculated density in left upper lobe  significantly increased in size since previous study consistent  with a pulmonary neoplasm.  Original Report Authenticated By: Lavonia Dana, M.D.  07/18/13- 72 yoF  Former smoker, followed for COPD complicated by DM, HBP.      Widowed Daughter here. At Thoracic Conference 06/16/13 we reviewed images, and Dr Donnamae Jude Onc indicated he would  probably be willing to offer SBRT XRT w/o bx if PET was consistent w/ CA. I called her about this, ordered  Biaxin, and sent Quant TB Assay ( NEG 06/17/13) to eval for possible infection.  ER for weakness 4 days ago. Noted potassium and magnesium low. They gave prednisone. PET 06/22/13- ( after 1 week biaxin) IMPRESSION:  1. Hypermetabolic left upper lobe pulmonary nodule is concerning  for bronchogenic carcinoma. If indeed this is carcinoma, FDG PET  staging T1a  N0 M0  2. No evidence metastasis.  3. Emphysematous change in the lungs  Original Report Authenticated By: Genevive Bi, M.D. CXR 07/15/13 IMPRESSION:  Emphysema without acute cardiopulmonary findings.  Left upper lobe nodule.  Original Report Authenticated By: Kennith Center, M.D.   11/18/13- 59 yoF  Former smoker, followed for COPD, LUL nodule assumed CA/ SBRT,  complicated by DM, HBP.      Widowed Daughter here. FOLLOWS FOR: denies any wheezing, cough, congestion, or SOB unless over exerting herself. Completed SBRT/ Dr Roselind Messier presumptively to LUL nodule. O2 2L Has had radiation treatment for lung nodule empiricallly managed as bronchogenic carcinoma.  Moving to Assisted Living in January. O2 2L continuous/ [employer]. Denies pain and feels stable.  Review of Systems- see HPI Constitutional:   No-   weight loss, night sweats, fevers, chills, fatigue, lassitude. HEENT:   No-  headaches, difficulty swallowing, tooth/dental problems, sore throat,       No-  sneezing, itching, ear ache, nasal congestion, post nasal drip,  CV:  No-   chest pain, orthopnea, PND, swelling in lower extremities, anasarca, dizziness, +palpitations Resp: +  shortness of breath with exertion, not at rest.              No-   productive cough,  + non-productive cough,  No- coughing up of blood.              No-   change in color of mucus.  No- wheezing.   Skin: No-   rash or lesions. GI:  No-   heartburn, indigestion, abdominal pain, nausea, vomiting, GU:  MS:  No-   joint pain or swelling.   Neuro-     nothing unusual Psych:  No- change in mood or affect. + depression or anxiety.  No memory loss.  Objective:   General- Alert, Oriented, Affect+depressed, crying intermittently, Distress- none acute,              2l / 95% by her portable concentrator      Frail, elderly, week, wheelchair. Skin- rash-none, lesions- none, excoriation- none.  Lymphadenopathy- none Head- atraumatic            Eyes- Gross vision  intact, PERRLA, conjunctivae clear secretions            Ears- Hearing, canals-normal            Nose- Clear, no-Septal dev, mucus, polyps, erosion, perforation             Throat- Mallampati II , mucosa clear , drainage- none, tonsils- atrophic Neck- flexible , trachea midline, no stridor , thyroid nl, carotid no bruit Chest - symmetrical excursion , unlabored           Heart/CV- RRR , no murmur , no gallop  , no rub, nl s1 s2                           - JVD- none , edema- none, stasis changes-  none, varices- none           Lung- +clear to P&A- very distant, wheeze- none, cough- none , dullness-none, rub- none           Chest wall-  Abd-  Br/ Gen/ Rectal- Not done, not indicated Extrem- cyanosis- none, clubbing, none, atrophy- none, rolling walker. Neuro- grossly intact to observation

## 2013-11-28 ENCOUNTER — Ambulatory Visit
Admission: RE | Admit: 2013-11-28 | Discharge: 2013-11-28 | Disposition: A | Discharge status: Home / Self Care | Payer: PRIVATE HEALTH INSURANCE | Source: Ambulatory Visit | Attending: Radiation Oncology | Admitting: Radiation Oncology

## 2013-11-28 ENCOUNTER — Ambulatory Visit
Admission: RE | Admit: 2013-11-28 | Discharge: 2013-11-28 | Disposition: A | Discharge status: Home / Self Care | Payer: Medicare Other | Source: Ambulatory Visit | Attending: Radiation Oncology | Admitting: Radiation Oncology

## 2013-11-28 ENCOUNTER — Telehealth: Payer: Self-pay | Admitting: Internal Medicine

## 2013-11-28 ENCOUNTER — Encounter: Payer: Self-pay | Admitting: Radiation Oncology

## 2013-11-28 ENCOUNTER — Ambulatory Visit: Payer: Self-pay | Admitting: Radiation Oncology

## 2013-11-28 ENCOUNTER — Telehealth: Payer: Self-pay | Admitting: *Deleted

## 2013-11-28 VITALS — BP 145/46 | HR 99 | Temp 97.6°F | Ht 62.0 in | Wt 119.4 lb

## 2013-11-28 DIAGNOSIS — R918 Other nonspecific abnormal finding of lung field: Secondary | ICD-10-CM

## 2013-11-28 DIAGNOSIS — C3410 Malignant neoplasm of upper lobe, unspecified bronchus or lung: Secondary | ICD-10-CM | POA: Insufficient documentation

## 2013-11-28 LAB — BUN AND CREATININE (CC13): Creatinine: 0.9 mg/dL (ref 0.6–1.1)

## 2013-11-28 MED ORDER — FLUTICASONE-SALMETEROL 250-50 MCG/DOSE IN AEPB: 1.0000 | INHALATION_SPRAY | Freq: Two times a day (BID) | RESPIRATORY_TRACT | Status: DC

## 2013-11-28 NOTE — Progress Notes (Signed)
Linus Orn here with her daughter for follow up after treatment to left upper lobe.  She denies pain.  She reports that she had a spell last Tuesday where she had trouble breathing and had to use her rescue inhaler.  She is on 2l of oxygen via nasal canula.  Her oxygen saturation is 92% today.  She has been resting in bed every since.  She has lost 5 lbs since 11/18/2013.  She reports that her appetite is OK.  She denies a cough or coughing up blood.  Her daughter reports that she gets very short of breath with activity.

## 2013-11-28 NOTE — Telephone Encounter (Signed)
Per CY-sample Advair 250/50 x 2 for patient.

## 2013-11-28 NOTE — Progress Notes (Signed)
Radiation Oncology         [phone] ________________________________  Name: Jennifer Duran MRN: [id number]  Date: 11/28/2013  DOB: 05/07/1929  Follow-Up Visit Note  CC: REED, TIFFANY, DO  Reed, Tiffany L, DO  Diagnosis:  PET positive pulmonary nodule presenting in the left upper lobe, clinical stage I   Interval Since Last Radiation:  3 and 1/2  months.  she completed SB RT for her lung nodule  Narrative:  The patient returns today for routine follow-up.  She seems to be doing reasonably well. She did have one episode of shortness of breath several days ago but none since. She continues on supplemental oxygen at 2 L. She denies any pain in the chest area significant cough or hemoptysis.  She did undergo chest x-ray recently with stable results                              ALLERGIES:  is allergic to celecoxib; codeine; metronidazole; pregabalin; propranolol hcl; rofecoxib; iodine; and pregabalin.  Meds: Current Outpatient Prescriptions  Medication Sig Dispense Refill  . albuterol (PROVENTIL HFA;VENTOLIN HFA) 108 (90 BASE) MCG/ACT inhaler Inhale 2 puffs into the lungs every 4 (four) hours as needed for wheezing or shortness of breath.  1 Inhaler  prn  . citalopram (CELEXA) 40 MG tablet Take one tablet by mouth once daily  30 tablet  1  . clonazePAM (KLONOPIN) 0.5 MG tablet Take one tablet by mouth twice daily and take two tablets at bedtime for anxiety  120 tablet  0  . clopidogrel (PLAVIX) 75 MG tablet Take 1 tablet (75 mg total) by mouth daily.  30 tablet  3  . Fluticasone-Salmeterol (ADVAIR) 250-50 MCG/DOSE AEPB Inhale 1 puff into the lungs 2 (two) times daily.  2 each  0  . furosemide (LASIX) 20 MG tablet Take 20 mg by mouth every morning.      . insulin aspart (NOVOLOG) 100 UNIT/ML injection Inject 5 Units into the skin 3 (three) times daily before meals.       . insulin glargine (LANTUS) 100 UNIT/ML injection Inject 10 Units into the skin at bedtime.        . Insulin Pen  Needle 31G X 8 MM MISC Check blood sugar four times daily. DX: 250.00 and 401.9  120 each  11  . lisinopril (PRINIVIL,ZESTRIL) 10 MG tablet TAKE 1 TABLET (10 MG TOTAL) BY MOUTH DAILY.  30 tablet  2  . ONE TOUCH ULTRA TEST test strip TEST BLOOD SUGAR TWO TIMES DAILY  100 each  0  . tiotropium (SPIRIVA HANDIHALER) 18 MCG inhalation capsule Place 1 capsule (18 mcg total) into inhaler and inhale daily.  90 capsule  3  . traMADol (ULTRAM) 50 MG tablet Take two tablets by mouth at bedtime  60 tablet  0  . ondansetron (ZOFRAN ODT) 4 MG disintegrating tablet Take 1 tablet (4 mg total) by mouth every 8 (eight) hours as needed for nausea.  10 tablet  0   No current facility-administered medications for this encounter.    Physical Findings: The patient is in no acute distress. Patient is alert and oriented.  height is 5\' 2"  (1.575 m) and weight is 119 lb 6.4 oz (54.159 kg). Her temperature is 97.6 F (36.4 C). Her blood pressure is 145/46 and her pulse is 99. Her oxygen saturation is 92%. .  No palpable supraclavicular or axillary adenopathy. The lungs are clear to auscultation  but breath sounds in general distant. The heart has regular rhythm and rate.  Lab Findings: Lab Results  Component Value Date   WBC 8.3 11/02/2013   HGB 12.8 11/02/2013   HCT 40.7 11/02/2013   MCV 87.2 11/02/2013   PLT 314 11/02/2013      Radiographic Findings: Dg Chest 2 View  11/18/2013   CLINICAL DATA:  Lung nodules  EXAM: CHEST  2 VIEW  COMPARISON:  07/15/2013  FINDINGS: Normal heart size. No pleural effusion or edema. The lungs are hyperinflated and there is interstitial change compatible with COPD/emphysema. Irregular density in the left upper lobe is again noted and appears unchanged from previous exam. No airspace consolidation identified. The bones appear diffusely osteopenic.  IMPRESSION: 1. Left upper lobe pulmonary nodule persists and is unchanged from previous exam. On PET-CT from 06/20/2013 this was described as  hypermetabolic in worrisome for bronchogenic carcinoma.   Electronically Signed   By: Signa Kell M.D.   On: 11/18/2013 16:32    Impression:  The patient is recovering from the effects of radiation.  No evidence of recurrence on clinical exam today  Plan:  Routine followup in 3 months. The patient scheduled for followup with pulmonary in 6 months. Patient will be scheduled for a chest CT scan to assess her response to SBRT.  _____________________________________  -----------------------------------  Billie Lade, PhD, MD

## 2013-11-28 NOTE — Telephone Encounter (Signed)
Pt aware and samples left for pick up. Nothing further needed

## 2013-11-28 NOTE — Telephone Encounter (Signed)
I called and spoke with pt. She reports she is in the donut hole and is out of advair. She is requesting sample. Please advise Dr. Maple Hudson thanks

## 2013-11-28 NOTE — Telephone Encounter (Signed)
Called patient to inform of test, spoke with patient and she is aware of this test. 

## 2013-11-29 ENCOUNTER — Other Ambulatory Visit: Payer: Self-pay | Admitting: Internal Medicine

## 2013-11-29 ENCOUNTER — Ambulatory Visit
Admission: RE | Admit: 2013-11-29 | Discharge: 2013-11-29 | Disposition: A | Discharge status: Home / Self Care | Payer: Medicare Other | Source: Ambulatory Visit

## 2013-11-29 ENCOUNTER — Ambulatory Visit: Payer: Self-pay | Admitting: Radiation Oncology

## 2013-11-29 ENCOUNTER — Other Ambulatory Visit: Payer: Self-pay | Admitting: Nurse Practitioner

## 2013-11-29 DIAGNOSIS — Z1231 Encounter for screening mammogram for malignant neoplasm of breast: Secondary | ICD-10-CM

## 2013-12-01 ENCOUNTER — Encounter: Payer: Self-pay | Admitting: Internal Medicine

## 2013-12-01 ENCOUNTER — Ambulatory Visit (INDEPENDENT_AMBULATORY_CARE_PROVIDER_SITE_OTHER): Payer: PRIVATE HEALTH INSURANCE | Admitting: Internal Medicine

## 2013-12-01 VITALS — BP 130/78 | HR 82 | Temp 97.7°F | Resp 14 | Wt 120.4 lb

## 2013-12-01 DIAGNOSIS — F411 Generalized anxiety disorder: Secondary | ICD-10-CM

## 2013-12-01 DIAGNOSIS — R6889 Other general symptoms and signs: Secondary | ICD-10-CM

## 2013-12-01 DIAGNOSIS — E1149 Type 2 diabetes mellitus with other diabetic neurological complication: Secondary | ICD-10-CM

## 2013-12-01 DIAGNOSIS — J4489 Other specified chronic obstructive pulmonary disease: Secondary | ICD-10-CM

## 2013-12-01 DIAGNOSIS — M810 Age-related osteoporosis without current pathological fracture: Secondary | ICD-10-CM

## 2013-12-01 DIAGNOSIS — J449 Chronic obstructive pulmonary disease, unspecified: Secondary | ICD-10-CM

## 2013-12-01 DIAGNOSIS — R918 Other nonspecific abnormal finding of lung field: Secondary | ICD-10-CM

## 2013-12-01 DIAGNOSIS — E1142 Type 2 diabetes mellitus with diabetic polyneuropathy: Secondary | ICD-10-CM

## 2013-12-01 DIAGNOSIS — E1140 Type 2 diabetes mellitus with diabetic neuropathy, unspecified: Secondary | ICD-10-CM

## 2013-12-01 DIAGNOSIS — R222 Localized swelling, mass and lump, trunk: Secondary | ICD-10-CM

## 2013-12-01 MED ORDER — TRAMADOL HCL 50 MG PO TABS: ORAL_TABLET | ORAL | Status: DC

## 2013-12-01 MED ORDER — CITALOPRAM HYDROBROMIDE 40 MG PO TABS: ORAL_TABLET | ORAL | Status: DC

## 2013-12-01 MED ORDER — GLUCOSE BLOOD VI STRP: ORAL_STRIP | Status: DC

## 2013-12-01 MED ORDER — LISINOPRIL 10 MG PO TABS: ORAL_TABLET | ORAL | Status: DC

## 2013-12-01 MED ORDER — CLONAZEPAM 0.5 MG PO TABS: ORAL_TABLET | ORAL | Status: DC

## 2013-12-01 MED ORDER — CLOPIDOGREL BISULFATE 75 MG PO TABS: 75.0000 mg | ORAL_TABLET | Freq: Every day | ORAL | Status: DC

## 2013-12-01 NOTE — Progress Notes (Signed)
Patient ID: Jennifer Duran, female   DOB: 02/05/1929, 79 y.o.   MRN: [id number]   Location:  [facility]  Code Status: DNR   Allergies  Allergen Reactions  . Celecoxib Swelling  . Codeine Nausea And Vomiting  . Metronidazole Other (See Comments)    neuropathy  . Pregabalin Swelling  . Propranolol Hcl Other (See Comments)    Unknown  . Rofecoxib Other (See Comments)    Unknown  . Iodine Rash    Only if ingested  . Pregabalin Hives and Rash    Chief Complaint  Patient presents with  . Medical Managment of Chronic Issues    Patient going into [facility]    HPI: Patient is a 79 y.o. white female seen in the office today for FL2 to get into AL at [facility].   Weak, not doing too much.  Cannot get up and move much w/o using rescue inhaler.   Needs help bathing, dressing--needs clothes put out, cannot bend over w/o getting very short of breath.  Able to transfer to bathroom if close.  Urgency present--losing control of bladder more often.  Not using depends.  Bowels are moving fine.  No pain still in lungs.  Left lung tumor.  Neuropathy in legs for 20 years--no change.  Sugars have been running really well with lantus and novolog.  Average between 90-110 fasting.  Appetite good.  No low ones.   Also needs someone to give her insulin due to tremor.  Primidone did not help--clonazepam was working much better.  Twitches a lot in her sleep--like jumping in the bed.  Next month for f/u Ct of her chest with Dr. Roselind Messier to see if XRT has helped shrink the mass in her left lung.    Review of Systems:  Review of Systems  Constitutional: Positive for weight loss and malaise/fatigue. Negative for fever and chills.  HENT: Negative for congestion.   Eyes: Negative for blurred vision.  Respiratory: Positive for shortness of breath and wheezing. Negative for cough.   Cardiovascular: Negative for chest pain and leg swelling.    Gastrointestinal: Negative for abdominal pain, constipation, blood in stool and melena.  Genitourinary: Negative for dysuria.  Musculoskeletal: Negative for myalgias.  Skin: Negative for rash.  Neurological: Positive for sensory change and weakness.       Diabetic peripheral neuropathy     Past Medical History  Diagnosis Date  . Allergic rhinitis, cause unspecified   . Unspecified essential hypertension   . Cor pulmonale   . COPD (chronic obstructive pulmonary disease)   . Type II or unspecified type diabetes mellitus without mention of complication, not stated as uncontrolled   . Osteoporosis   . Hashimoto's disease   . Peripheral vascular disease   . Hyperlipidemia   . Depression   . Urinary frequency   . Other atopic dermatitis and related conditions   . Abnormal involuntary movements(781.0)   . Hyposmolality and/or hyponatremia   . Peripheral vascular disease, unspecified   . Lung mass   . Insomnia   . History of radiation therapy 08/08/2013, 08/10/2013, 08/16/2013    SBRT 54 Gy to left upper lobe pulmonary nodule    Past Surgical History  Procedure Laterality Date  . Total abdominal hysterectomy    . Cataracts    . Rotator cuff repair    . Mastectomy Right 1952  . Eye surgery      Social History:   reports  that she has quit smoking. She has never used smokeless tobacco. She reports that she does not drink alcohol or use illicit drugs.  Family History  Problem Relation Age of Onset  . Cancer Mother     lung   . Lupus Father   . Heart disease Sister     Medications: Patient's Medications  New Prescriptions   No medications on file  Previous Medications   ALBUTEROL (PROVENTIL HFA;VENTOLIN HFA) 108 (90 BASE) MCG/ACT INHALER    Inhale 2 puffs into the lungs every 4 (four) hours as needed for wheezing or shortness of breath.   CITALOPRAM (CELEXA) 40 MG TABLET    Take one tablet by mouth once daily   CLONAZEPAM (KLONOPIN) 0.5 MG TABLET    Take one tablet by  mouth twice daily and take two tablets at bedtime for anxiety   CLOPIDOGREL (PLAVIX) 75 MG TABLET    Take 1 tablet (75 mg total) by mouth daily.   FLUTICASONE-SALMETEROL (ADVAIR) 250-50 MCG/DOSE AEPB    Inhale 1 puff into the lungs 2 (two) times daily.   FUROSEMIDE (LASIX) 40 MG TABLET    TAKE 1 TABLET (40 MG TOTAL) BY MOUTH DAILY.   INSULIN ASPART (NOVOLOG) 100 UNIT/ML INJECTION    Inject 5 Units into the skin 3 (three) times daily before meals.    INSULIN GLARGINE (LANTUS) 100 UNIT/ML INJECTION    Inject 10 Units into the skin at bedtime.     INSULIN PEN NEEDLE 31G X 8 MM MISC    Check blood sugar four times daily. DX: 250.00 and 401.9   LISINOPRIL (PRINIVIL,ZESTRIL) 10 MG TABLET    TAKE 1 TABLET (10 MG TOTAL) BY MOUTH DAILY.   ONDANSETRON (ZOFRAN ODT) 4 MG DISINTEGRATING TABLET    Take 1 tablet (4 mg total) by mouth every 8 (eight) hours as needed for nausea.   ONE TOUCH ULTRA TEST TEST STRIP    TEST BLOOD SUGAR TWO TIMES DAILY   TIOTROPIUM (SPIRIVA HANDIHALER) 18 MCG INHALATION CAPSULE    Place 1 capsule (18 mcg total) into inhaler and inhale daily.   TRAMADOL (ULTRAM) 50 MG TABLET    Take two tablets by mouth at bedtime  Modified Medications   No medications on file  Discontinued Medications   CLOPIDOGREL (PLAVIX) 75 MG TABLET    TAKE 1 TABLET DAILY TO IMPROVE CIRCULATION   FUROSEMIDE (LASIX) 20 MG TABLET    Take 20 mg by mouth every morning.   Physical Exam: Filed Vitals:   12/01/13 1405  BP: 130/78  Pulse: 82  Temp: 97.7 F (36.5 C)  TempSrc: Oral  Resp: 14  Weight: 120 lb 6.4 oz (54.613 kg)  Physical Exam  Constitutional: She is oriented to person, place, and time. No distress.  Oxygen-dependent  HENT:  Head: Normocephalic and atraumatic.  Neck: Neck supple.  Cardiovascular: Normal rate, regular rhythm, normal heart sounds and intact distal pulses.   Pulmonary/Chest: Effort normal. No respiratory distress.  Chronic rhonchi  Abdominal: Soft. Bowel sounds are normal. She  exhibits no distension. There is no tenderness.  Musculoskeletal: Normal range of motion. She exhibits no edema and no tenderness.  Neurological: She is alert and oriented to person, place, and time.  Has tremor  Skin: Skin is warm and dry.  Psychiatric:  anxious    Labs reviewed: Basic Metabolic Panel:  Recent Labs  11/91/47 1437 05/19/13 1420 07/15/13 1348 08/10/13 1715 11/02/13 1545 11/02/13 1620 11/28/13 1549  NA  --  143 140 141 142  --   --  K  --  3.7 3.2* 3.8 4.0  --   --   CL  --  105 102 102 100  --   --   CO2  --  24  --  27 28  --   --   GLUCOSE  --  90 156* 98 98  --   --   BUN  --  14 19 12 15   --  15.3  CREATININE  --  0.80 1.00 0.91 0.90  --  0.9  CALCIUM  --  9.9  --  9.0 10.2  --   --   MG  --   --   --   --   --  2.1  --   TSH 0.680  --   --   --   --   --   --    Liver Function Tests:  Recent Labs  01/10/13 1205  05/19/13 1420 08/10/13 1715 11/02/13 1545  AST 28  < > 22 20 21   ALT 13  < > 8 6 8   ALKPHOS 67  < > 55 60 63  BILITOT 0.4  < > 0.4 0.2 0.6  PROT 7.4  < > 6.8 6.0 7.0  ALBUMIN 3.9  --  3.6  --  3.8  < > = values in this interval not displayed. CBC:  Recent Labs  05/03/13 1437 05/19/13 1420 07/15/13 1345 07/15/13 1348 11/02/13 1545  WBC 8.0 8.9 8.9  --  8.3  NEUTROABS 5.3 6.5  --   --  6.4  HGB 11.0* 13.5 14.2 15.6* 12.8  HCT 33.7* 41.1 43.8 46.0 40.7  MCV 86 87.1 86.1  --  87.2  PLT  --  364 344  --  314   Lab Results  Component Value Date   HGBA1C 6.3* 08/10/2013   Assessment/Plan 1. Lung mass -has left sided lung mass s/p XRT -has CT coming up to check on progress -due to her overall debility now with this and her late stage COPD   2. COPD (chronic obstructive pulmonary disease) -O2 dependent, continue current inhalers -very minimal activity possible due to her dyspnea--unable to really bend over or walk and distance   3. Type II or unspecified type diabetes mellitus with neurological manifestations, not stated  as uncontrolled(250.60) -cont lantus and novolog, samples given -sugars have been well controlled w/o hypoglycemia and she is eating well lately   4. Generalized anxiety disorder -cont clonazepam--requested script today, was last filled 11/14--said she is not filling it yet, but earlier in the visit, they said she was out of it for 2 wks -was refilled with 2 refills on it only  5. Diabetic neuropathy -cont current tramadol, not on gabapentin or lyrica--did not tolerate lyrica--rash, hives  6. Osteoporosis, unspecified -apparently stopped her ca with D supplements--will recommend resuming at next appt if she returns here after moving  Labs/tests ordered:  Orders Placed This Encounter  Procedures  . TSH  . Comprehensive metabolic panel  . Hemoglobin A1c  . CBC with Differential   Next appt:  3 mos

## 2013-12-02 LAB — CBC WITH DIFFERENTIAL/PLATELET
Basophils Absolute: 0 10*3/uL (ref 0.0–0.2)
Basos: 0 %
Eos: 4 %
Eosinophils Absolute: 0.3 10*3/uL (ref 0.0–0.4)
HCT: 37.7 % (ref 34.0–46.6)
Hemoglobin: 12.4 g/dL (ref 11.1–15.9)
Immature Grans (Abs): 0 10*3/uL (ref 0.0–0.1)
Immature Granulocytes: 0 %
Lymphocytes Absolute: 1.3 10*3/uL (ref 0.7–3.1)
Lymphs: 16 %
MCH: 27.6 pg (ref 26.6–33.0)
MCHC: 32.9 g/dL (ref 31.5–35.7)
MCV: 84 fL (ref 79–97)
Monocytes Absolute: 0.7 10*3/uL (ref 0.1–0.9)
Monocytes: 8 %
Neutrophils Absolute: 5.8 10*3/uL (ref 1.4–7.0)
Neutrophils Relative %: 72 %
RBC: 4.49 x10E6/uL (ref 3.77–5.28)
RDW: 14.6 % (ref 12.3–15.4)
WBC: 8 10*3/uL (ref 3.4–10.8)

## 2013-12-02 LAB — COMPREHENSIVE METABOLIC PANEL
ALT: 6 IU/L (ref 0–32)
AST: 18 IU/L (ref 0–40)
Albumin/Globulin Ratio: 1.8 (ref 1.1–2.5)
Albumin: 3.9 g/dL (ref 3.5–4.7)
Alkaline Phosphatase: 66 IU/L (ref 39–117)
BUN/Creatinine Ratio: 15 (ref 11–26)
BUN: 12 mg/dL (ref 8–27)
CO2: 27 mmol/L (ref 18–29)
Calcium: 9.2 mg/dL (ref 8.6–10.2)
Chloride: 99 mmol/L (ref 97–108)
Creatinine, Ser: 0.82 mg/dL (ref 0.57–1.00)
GFR calc Af Amer: 77 mL/min/{1.73_m2} (ref >59)
GFR calc non Af Amer: 67 mL/min/{1.73_m2} (ref >59)
Globulin, Total: 2.2 g/dL (ref 1.5–4.5)
Glucose: 155 mg/dL — ABNORMAL HIGH (ref 65–99)
Potassium: 3.7 mmol/L (ref 3.5–5.2)
Sodium: 142 mmol/L (ref 134–144)
Total Bilirubin: 0.2 mg/dL (ref 0.0–1.2)
Total Protein: 6.1 g/dL (ref 6.0–8.5)

## 2013-12-02 LAB — HEMOGLOBIN A1C
Est. average glucose Bld gHb Est-mCnc: 117 mg/dL
Hgb A1c MFr Bld: 5.7 % — ABNORMAL HIGH (ref 4.8–5.6)

## 2013-12-02 LAB — TSH: TSH: 1.32 u[IU]/mL (ref 0.450–4.500)

## 2013-12-05 NOTE — Assessment & Plan Note (Signed)
Severe COPD but fairly well controlled now, especially since she has had recent radiation therapy Plan-chest x-ray, Prevnar pneumonia conjugate vaccine 13.

## 2013-12-08 ENCOUNTER — Encounter (HOSPITAL_COMMUNITY): Payer: Self-pay

## 2013-12-08 ENCOUNTER — Other Ambulatory Visit: Payer: Self-pay | Admitting: Radiation Oncology

## 2013-12-08 ENCOUNTER — Ambulatory Visit (HOSPITAL_COMMUNITY)
Admission: RE | Admit: 2013-12-08 | Discharge: 2013-12-08 | Disposition: A | Discharge status: Home / Self Care | Payer: Medicare Other | Source: Ambulatory Visit | Attending: Radiation Oncology | Admitting: Radiation Oncology

## 2013-12-08 DIAGNOSIS — J438 Other emphysema: Secondary | ICD-10-CM | POA: Insufficient documentation

## 2013-12-08 DIAGNOSIS — M4120 Other idiopathic scoliosis, site unspecified: Secondary | ICD-10-CM | POA: Insufficient documentation

## 2013-12-08 DIAGNOSIS — J984 Other disorders of lung: Secondary | ICD-10-CM | POA: Insufficient documentation

## 2013-12-08 DIAGNOSIS — R911 Solitary pulmonary nodule: Secondary | ICD-10-CM | POA: Insufficient documentation

## 2013-12-08 DIAGNOSIS — I2510 Atherosclerotic heart disease of native coronary artery without angina pectoris: Secondary | ICD-10-CM | POA: Insufficient documentation

## 2013-12-08 DIAGNOSIS — I700 Atherosclerosis of aorta: Secondary | ICD-10-CM | POA: Insufficient documentation

## 2013-12-08 DIAGNOSIS — D3500 Benign neoplasm of unspecified adrenal gland: Secondary | ICD-10-CM | POA: Insufficient documentation

## 2013-12-08 DIAGNOSIS — M479 Spondylosis, unspecified: Secondary | ICD-10-CM | POA: Insufficient documentation

## 2013-12-08 DIAGNOSIS — R918 Other nonspecific abnormal finding of lung field: Secondary | ICD-10-CM

## 2013-12-08 MED ORDER — IOHEXOL 300 MG/ML  SOLN: 80.0000 mL | Freq: Once | INTRAMUSCULAR | Status: AC | PRN

## 2013-12-14 ENCOUNTER — Telehealth: Payer: Self-pay | Admitting: *Deleted

## 2013-12-14 ENCOUNTER — Ambulatory Visit: Payer: Self-pay | Admitting: Nurse Practitioner

## 2013-12-14 NOTE — Telephone Encounter (Signed)
Patient wants to speak with you concerning her labs. Tried to explain to patient but she wants to speak with you concerning her insulin.

## 2013-12-28 ENCOUNTER — Other Ambulatory Visit: Payer: Self-pay | Admitting: Internal Medicine

## 2014-01-02 ENCOUNTER — Other Ambulatory Visit: Payer: Self-pay | Admitting: *Deleted

## 2014-01-02 MED ORDER — GLUCOSE BLOOD VI STRP: ORAL_STRIP | Status: DC

## 2014-01-02 NOTE — Telephone Encounter (Signed)
rx printed and paperwork faxed to [place].

## 2014-02-18 NOTE — Progress Notes (Signed)
This encounter was created in error - please disregard.

## 2014-02-22 ENCOUNTER — Emergency Department (HOSPITAL_COMMUNITY): Payer: Medicare HMO

## 2014-02-22 ENCOUNTER — Encounter (HOSPITAL_COMMUNITY): Payer: Self-pay | Admitting: Emergency Medicine

## 2014-02-22 ENCOUNTER — Inpatient Hospital Stay (HOSPITAL_COMMUNITY)
Admission: EM | Admit: 2014-02-22 | Discharge: 2014-02-28 | DRG: 190 | Disposition: A | Discharge status: Skilled Nursing Facility | Payer: Medicare HMO | Attending: Internal Medicine | Admitting: Internal Medicine

## 2014-02-22 DIAGNOSIS — M810 Age-related osteoporosis without current pathological fracture: Secondary | ICD-10-CM | POA: Diagnosis present

## 2014-02-22 DIAGNOSIS — I10 Essential (primary) hypertension: Secondary | ICD-10-CM | POA: Diagnosis present

## 2014-02-22 DIAGNOSIS — J439 Emphysema, unspecified: Secondary | ICD-10-CM | POA: Diagnosis present

## 2014-02-22 DIAGNOSIS — J441 Chronic obstructive pulmonary disease with (acute) exacerbation: Principal | ICD-10-CM | POA: Diagnosis present

## 2014-02-22 DIAGNOSIS — I279 Pulmonary heart disease, unspecified: Secondary | ICD-10-CM | POA: Diagnosis present

## 2014-02-22 DIAGNOSIS — F3289 Other specified depressive episodes: Secondary | ICD-10-CM | POA: Diagnosis present

## 2014-02-22 DIAGNOSIS — Z87891 Personal history of nicotine dependence: Secondary | ICD-10-CM

## 2014-02-22 DIAGNOSIS — Z923 Personal history of irradiation: Secondary | ICD-10-CM

## 2014-02-22 DIAGNOSIS — J449 Chronic obstructive pulmonary disease, unspecified: Secondary | ICD-10-CM | POA: Diagnosis present

## 2014-02-22 DIAGNOSIS — J9620 Acute and chronic respiratory failure, unspecified whether with hypoxia or hypercapnia: Secondary | ICD-10-CM

## 2014-02-22 DIAGNOSIS — J111 Influenza due to unidentified influenza virus with other respiratory manifestations: Secondary | ICD-10-CM | POA: Diagnosis present

## 2014-02-22 DIAGNOSIS — Z79899 Other long term (current) drug therapy: Secondary | ICD-10-CM

## 2014-02-22 DIAGNOSIS — J4489 Other specified chronic obstructive pulmonary disease: Secondary | ICD-10-CM

## 2014-02-22 DIAGNOSIS — E119 Type 2 diabetes mellitus without complications: Secondary | ICD-10-CM | POA: Diagnosis present

## 2014-02-22 DIAGNOSIS — R222 Localized swelling, mass and lump, trunk: Secondary | ICD-10-CM | POA: Diagnosis present

## 2014-02-22 DIAGNOSIS — Z85118 Personal history of other malignant neoplasm of bronchus and lung: Secondary | ICD-10-CM

## 2014-02-22 DIAGNOSIS — F329 Major depressive disorder, single episode, unspecified: Secondary | ICD-10-CM | POA: Diagnosis present

## 2014-02-22 DIAGNOSIS — Z9981 Dependence on supplemental oxygen: Secondary | ICD-10-CM

## 2014-02-22 DIAGNOSIS — R0602 Shortness of breath: Secondary | ICD-10-CM | POA: Diagnosis present

## 2014-02-22 DIAGNOSIS — F411 Generalized anxiety disorder: Secondary | ICD-10-CM | POA: Diagnosis present

## 2014-02-22 DIAGNOSIS — J309 Allergic rhinitis, unspecified: Secondary | ICD-10-CM | POA: Diagnosis present

## 2014-02-22 DIAGNOSIS — I739 Peripheral vascular disease, unspecified: Secondary | ICD-10-CM | POA: Diagnosis present

## 2014-02-22 DIAGNOSIS — E785 Hyperlipidemia, unspecified: Secondary | ICD-10-CM | POA: Diagnosis present

## 2014-02-22 DIAGNOSIS — Z794 Long term (current) use of insulin: Secondary | ICD-10-CM

## 2014-02-22 DIAGNOSIS — E063 Autoimmune thyroiditis: Secondary | ICD-10-CM | POA: Diagnosis present

## 2014-02-22 LAB — CBC WITH DIFFERENTIAL/PLATELET
Basophils Absolute: 0 10*3/uL (ref 0.0–0.1)
Basophils Relative: 0 % (ref 0–1)
EOS ABS: 0.1 10*3/uL (ref 0.0–0.7)
EOS PCT: 1 % (ref 0–5)
HCT: 40.6 % (ref 36.0–46.0)
HEMOGLOBIN: 12.8 g/dL (ref 12.0–15.0)
LYMPHS ABS: 3 10*3/uL (ref 0.7–4.0)
Lymphocytes Relative: 33 % (ref 12–46)
MCH: 27.1 pg (ref 26.0–34.0)
MCHC: 31.5 g/dL (ref 30.0–36.0)
MCV: 86 fL (ref 78.0–100.0)
MONOS PCT: 9 % (ref 3–12)
Monocytes Absolute: 0.8 10*3/uL (ref 0.1–1.0)
Neutro Abs: 5.3 10*3/uL (ref 1.7–7.7)
Neutrophils Relative %: 57 % (ref 43–77)
Platelets: 324 10*3/uL (ref 150–400)
RBC: 4.72 MIL/uL (ref 3.87–5.11)
RDW: 15 % (ref 11.5–15.5)
WBC: 9.2 10*3/uL (ref 4.0–10.5)

## 2014-02-22 LAB — I-STAT CHEM 8, ED
BUN: 15 mg/dL (ref 6–23)
CALCIUM ION: 1.07 mmol/L — AB (ref 1.13–1.30)
CREATININE: 1 mg/dL (ref 0.50–1.10)
Chloride: 102 mEq/L (ref 96–112)
Glucose, Bld: 132 mg/dL — ABNORMAL HIGH (ref 70–99)
HCT: 44 % (ref 36.0–46.0)
Hemoglobin: 15 g/dL (ref 12.0–15.0)
Potassium: 3.8 mEq/L (ref 3.7–5.3)
Sodium: 140 mEq/L (ref 137–147)
TCO2: 27 mmol/L (ref 0–100)

## 2014-02-22 LAB — TROPONIN I
Troponin I: <0.3 ng/mL (ref <0.30)
Troponin I: <0.3 ng/mL (ref <0.30)

## 2014-02-22 LAB — I-STAT TROPONIN, ED: Troponin i, poc: 0.1 ng/mL (ref 0.00–0.08)

## 2014-02-22 LAB — MRSA PCR SCREENING: MRSA by PCR: NEGATIVE

## 2014-02-22 MED ORDER — ALBUTEROL SULFATE (2.5 MG/3ML) 0.083% IN NEBU
5.0000 mg | INHALATION_SOLUTION | RESPIRATORY_TRACT | Status: AC | PRN
  Administered 2014-02-23: 2.5 mg via RESPIRATORY_TRACT
  Filled 2014-02-22: qty 6

## 2014-02-22 MED ORDER — CLONAZEPAM 0.5 MG PO TABS
0.5000 mg | ORAL_TABLET | Freq: Two times a day (BID) | ORAL | Status: DC
  Administered 2014-02-22 – 2014-02-23 (×2): 0.5 mg via ORAL
  Filled 2014-02-22 (×2): qty 1

## 2014-02-22 MED ORDER — INSULIN GLARGINE 100 UNIT/ML ~~LOC~~ SOLN
8.0000 [IU] | Freq: Every day | SUBCUTANEOUS | Status: DC
  Administered 2014-02-22: 8 [IU] via SUBCUTANEOUS
  Filled 2014-02-22 (×2): qty 0.08

## 2014-02-22 MED ORDER — FERROUS SULFATE 325 (65 FE) MG PO TABS
325.0000 mg | ORAL_TABLET | Freq: Every day | ORAL | Status: DC
  Administered 2014-02-22 – 2014-02-28 (×7): 325 mg via ORAL
  Filled 2014-02-22 (×7): qty 1

## 2014-02-22 MED ORDER — IPRATROPIUM BROMIDE 0.02 % IN SOLN
0.5000 mg | RESPIRATORY_TRACT | Status: DC
  Administered 2014-02-22 (×2): 0.5 mg via RESPIRATORY_TRACT
  Filled 2014-02-22 (×2): qty 2.5

## 2014-02-22 MED ORDER — INSULIN ASPART 100 UNIT/ML ~~LOC~~ SOLN
3.0000 [IU] | Freq: Three times a day (TID) | SUBCUTANEOUS | Status: DC
  Administered 2014-02-23 (×2): 3 [IU] via SUBCUTANEOUS

## 2014-02-22 MED ORDER — CLOPIDOGREL BISULFATE 75 MG PO TABS
75.0000 mg | ORAL_TABLET | Freq: Every day | ORAL | Status: DC
  Administered 2014-02-23 – 2014-02-28 (×6): 75 mg via ORAL
  Filled 2014-02-22 (×7): qty 1

## 2014-02-22 MED ORDER — ENOXAPARIN SODIUM 40 MG/0.4ML ~~LOC~~ SOLN
40.0000 mg | SUBCUTANEOUS | Status: DC
  Administered 2014-02-22 – 2014-02-27 (×6): 40 mg via SUBCUTANEOUS
  Filled 2014-02-22 (×7): qty 0.4

## 2014-02-22 MED ORDER — LEVALBUTEROL HCL 1.25 MG/3ML IN NEBU
1.2500 mg | INHALATION_SOLUTION | Freq: Once | RESPIRATORY_TRACT | Status: AC
  Administered 2014-02-22: 1.25 mg via RESPIRATORY_TRACT
  Filled 2014-02-22: qty 3

## 2014-02-22 MED ORDER — FUROSEMIDE 40 MG PO TABS
40.0000 mg | ORAL_TABLET | Freq: Every day | ORAL | Status: DC
  Administered 2014-02-23 – 2014-02-28 (×6): 40 mg via ORAL
  Filled 2014-02-22 (×6): qty 1

## 2014-02-22 MED ORDER — LEVALBUTEROL HCL 0.63 MG/3ML IN NEBU
0.6300 mg | INHALATION_SOLUTION | RESPIRATORY_TRACT | Status: DC
  Administered 2014-02-22 (×2): 0.63 mg via RESPIRATORY_TRACT
  Filled 2014-02-22 (×2): qty 3

## 2014-02-22 MED ORDER — TRAMADOL HCL 50 MG PO TABS
50.0000 mg | ORAL_TABLET | Freq: Two times a day (BID) | ORAL | Status: DC | PRN
  Administered 2014-02-24 – 2014-02-26 (×3): 50 mg via ORAL
  Filled 2014-02-22 (×3): qty 1

## 2014-02-22 MED ORDER — GUAIFENESIN ER 600 MG PO TB12
1200.0000 mg | ORAL_TABLET | Freq: Two times a day (BID) | ORAL | Status: DC
  Administered 2014-02-22 – 2014-02-23 (×2): 1200 mg via ORAL
  Filled 2014-02-22 (×3): qty 2

## 2014-02-22 MED ORDER — CITALOPRAM HYDROBROMIDE 40 MG PO TABS
40.0000 mg | ORAL_TABLET | Freq: Every day | ORAL | Status: DC
  Administered 2014-02-23 – 2014-02-26 (×4): 40 mg via ORAL
  Filled 2014-02-22 (×4): qty 1

## 2014-02-22 MED ORDER — LEVOFLOXACIN IN D5W 750 MG/150ML IV SOLN
750.0000 mg | INTRAVENOUS | Status: DC
  Filled 2014-02-22: qty 150

## 2014-02-22 MED ORDER — LISINOPRIL 10 MG PO TABS
10.0000 mg | ORAL_TABLET | Freq: Every day | ORAL | Status: DC
  Administered 2014-02-22 – 2014-02-25 (×4): 10 mg via ORAL
  Filled 2014-02-22 (×4): qty 1

## 2014-02-22 MED ORDER — METHYLPREDNISOLONE SODIUM SUCC 125 MG IJ SOLR
60.0000 mg | Freq: Two times a day (BID) | INTRAMUSCULAR | Status: DC
  Administered 2014-02-22 – 2014-02-24 (×5): 60 mg via INTRAVENOUS
  Filled 2014-02-22 (×2): qty 2
  Filled 2014-02-22 (×5): qty 0.96

## 2014-02-22 MED ORDER — LEVOFLOXACIN IN D5W 750 MG/150ML IV SOLN
750.0000 mg | Freq: Once | INTRAVENOUS | Status: AC
  Administered 2014-02-22: 750 mg via INTRAVENOUS
  Filled 2014-02-22: qty 150

## 2014-02-22 MED ORDER — MOMETASONE FURO-FORMOTEROL FUM 100-5 MCG/ACT IN AERO
2.0000 | INHALATION_SPRAY | Freq: Two times a day (BID) | RESPIRATORY_TRACT | Status: DC
  Administered 2014-02-22 – 2014-02-23 (×2): 2 via RESPIRATORY_TRACT
  Filled 2014-02-22: qty 8.8

## 2014-02-22 NOTE — ED Notes (Signed)
Results of IStat Trop given to Dr Alvino Chapel and RN Gerri Lins

## 2014-02-22 NOTE — ED Notes (Signed)
Per EMS pt from [facility] with sudden onset of respiratory distress this morning, recently diagnosed with pneumonia; pt given 125 mg IV solumedrol, 10 mg  albuterol and 0.5 Atrovent en route. Pt with wheezing in all fields.

## 2014-02-22 NOTE — ED Notes (Addendum)
Please call pt's daughter with updates: [phone]

## 2014-02-22 NOTE — H&P (Signed)
History and Physical   Jennifer Duran WUX:[id number] DOB: [birth year] DOA: 02/22/2014  Referring physician: Dr. Alvino Chapel PCP: Hollace Kinnier, DO  Specialists: none  Chief Complaint: shortness of breath  HPI: Jennifer Duran is a 80 y.o. female has a past medical history significant for COPD, DM, HTN and as per below presents to the ED with a chief complaint of shortness of breath. She reports that in the past 12-24 hours she has been having worsening breathing difficulties, more productive cough and low grade temperature at home. Denies chest pain, no nausea/vomiting/diarrhea, no lightheadedness or dizziness. For her COPD she is being followed by Dr. Annamaria Boots and she is on continuous oxygen. In the ED, initially severely short of breath and PCCM consult was contemplated by EDP however improved significantly after breathing treatments and [facility] asked to admit.   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   Review of Systems:   Past Medical History  Diagnosis Date  . Allergic rhinitis, cause unspecified   . Unspecified essential hypertension   . Cor pulmonale   . COPD (chronic obstructive pulmonary disease)   . Type II or unspecified type diabetes mellitus without mention of complication, not stated as uncontrolled   . Osteoporosis   . Hashimoto's disease   . Peripheral vascular disease   . Hyperlipidemia   . Depression   . Urinary frequency   . Other atopic dermatitis and related conditions   . Abnormal involuntary movements(781.0)   . Hyposmolality and/or hyponatremia   . Peripheral vascular disease, unspecified   . Lung mass   . Insomnia   . History of radiation therapy 08/08/2013, 08/10/2013, 08/16/2013    SBRT 54 Gy to left upper lobe pulmonary nodule   Past Surgical History  Procedure Laterality Date  . Total abdominal hysterectomy    . Cataracts    . Rotator cuff repair    . Mastectomy Right 1952  . Eye surgery      Social History:  reports that she has quit smoking. She has never used smokeless tobacco. She reports that she does not drink alcohol or use illicit drugs.  Allergies  Allergen Reactions  . Celecoxib Swelling  . Codeine Nausea And Vomiting  . Metronidazole Other (See Comments)    neuropathy  . Pregabalin Swelling  . Propranolol Hcl Other (See Comments)    Unknown  . Rofecoxib Other (See Comments)    Unknown  . Iodine Rash    Only if ingested  . Pregabalin Hives and Rash    Family History  Problem Relation Age of Onset  . Cancer Mother     lung   . Lupus Father   . Heart disease Sister    Prior to Admission medications   Medication Sig Start Date End Date Taking? Authorizing Provider  albuterol (PROVENTIL HFA;VENTOLIN HFA) 108 (90 BASE) MCG/ACT inhaler Inhale 2 puffs into the lungs every 4 (four) hours as needed for wheezing or shortness of breath. 11/18/13  Yes Deneise Lever, MD  citalopram (CELEXA) 40 MG tablet Take one tablet by mouth once daily 12/01/13  Yes Tiffany L Reed, DO  clonazePAM (KLONOPIN) 0.5 MG tablet Take 0.5-1.5 mg by mouth See admin instructions. Take 1 tablet each morning and 2 tablets at bedtime   Yes Historical Provider, MD  clopidogrel (PLAVIX) 75 MG tablet Take 1 tablet (75 mg total) by mouth daily. 12/01/13  Yes Tiffany L Reed, DO  ferrous sulfate 325 (65 FE)  MG tablet Take 325 mg by mouth daily.   Yes Historical Provider, MD  Fluticasone-Salmeterol (ADVAIR) 250-50 MCG/DOSE AEPB Inhale 1 puff into the lungs 2 (two) times daily. 11/28/13  Yes Deneise Lever, MD  furosemide (LASIX) 40 MG tablet Take 40 mg by mouth daily.   Yes Historical Provider, MD  guaiFENesin (MUCINEX) 600 MG 12 hr tablet Take 1,200 mg by mouth 2 (two) times daily.   Yes Historical Provider, MD  insulin aspart (NOVOLOG) 100 UNIT/ML injection Inject 5 Units into the skin 3 (three) times daily before meals.    Yes Historical Provider, MD  insulin glargine (LANTUS) 100 UNIT/ML injection  Inject 10 Units into the skin at bedtime.     Yes Historical Provider, MD  Insulin Pen Needle 31G X 8 MM MISC Check blood sugar four times daily. DX: 250.00 and 401.9 09/22/13  Yes Pricilla Larsson, NP  lisinopril (PRINIVIL,ZESTRIL) 10 MG tablet Take 10 mg by mouth daily.   Yes Historical Provider, MD  ondansetron (ZOFRAN ODT) 4 MG disintegrating tablet Take 1 tablet (4 mg total) by mouth every 8 (eight) hours as needed for nausea. 07/15/13  Yes Carmin Muskrat, MD  promethazine (PHENERGAN) 12.5 MG tablet Take 12.5 mg by mouth every 12 (twelve) hours as needed for nausea or vomiting.   Yes Historical Provider, MD  tiotropium (SPIRIVA HANDIHALER) 18 MCG inhalation capsule Place 1 capsule (18 mcg total) into inhaler and inhale daily. 05/25/13 05/25/14 Yes Deneise Lever, MD  traMADol Veatrice Bourbon) 50 MG tablet Take two tablets by mouth at bedtime 12/01/13  Yes Gayland Curry, DO   Physical Exam: Filed Vitals:   02/22/14 0917 02/22/14 0918 02/22/14 1123  BP: 120/71  117/48  Pulse: 134    Temp: 97.5 F (36.4 C)  97.3 F (36.3 C)  TempSrc: Oral  Oral  Resp: 38  30  SpO2: 100% 98% 93%     General:  No apparent distress, looks comfortable on nasal cannula   Eyes: PERRL, EOMI, no scleral icterus  ENT: moist oropharynx  Neck: supple, no JVD  Cardiovascular: regular rate without MRG; 2+ peripheral pulses  Respiratory: overall decreased breath sounds, wheezing present.   Abdomen: soft, non tender to palpation, positive bowel sounds, no guarding, no rebound  Skin: no rashes  Musculoskeletal: no peripheral edema  Psychiatric: normal mood and affect  Neurologic: CN 2-12 grossly intact, MS 5/5 in all 4  Labs on Admission:  Basic Metabolic Panel:  Recent Labs Lab 02/22/14 0957  NA 140  K 3.8  CL 102  GLUCOSE 132*  BUN 15  CREATININE 1.00   CBC:  Recent Labs Lab 02/22/14 0950 02/22/14 0957  WBC 9.2  --   NEUTROABS 5.3  --   HGB 12.8 15.0  HCT 40.6 44.0  MCV 86.0  --   PLT 324   --    Radiological Exams on Admission: Dg Chest Port 1 View  02/22/2014   CLINICAL DATA:  sob  EXAM: PORTABLE CHEST - 1 VIEW  COMPARISON:  CT CHEST W/O CM dated 12/08/2013; DG CHEST 2 VIEW dated 11/18/2013  FINDINGS: Lungs are hyperinflated. There is diffuse prominence of the interstitial markings. Previous described area increased density within the left upper hemi thorax has slightly increased in conspicuity low visualization is partially obscured secondary to an overlying cardiac monitoring lead. No focal regions of consolidation appreciated. Cardiac silhouette is within normal limits. Atherosclerotic calcifications identified within the aorta the osseous structures are unremarkable. There is lucency within the  lung apices.  IMPRESSION: Emphysematous changes within the lungs as well as a underlying component of fibrotic changes. No focal regions of consolidation. The ill-defined area of increased density within the upper left hemi thorax is slightly more conspicuous when compared to the previous study.   Electronically Signed   By: Margaree Mackintosh M.D.   On: 02/22/2014 10:29    EKG: Independently reviewed.  Assessment/Plan Principal Problem:   COPD exacerbation Active Problems:   Type II or unspecified type diabetes mellitus without mention of complication, not stated as uncontrolled   HYPERTENSION   COPD with emphysema   DYSPNEA   COPD (chronic obstructive pulmonary disease)    COPD exacerbation - will check for flu, given cough with sputum empiric Levofloxacin. Steroids given wheezing, breathing treatments prn. Will observe in SDU overnight.  HTN - hold lasix for now restart as indicated.  DM - continue home medicatios Anxiety - continue clonazepam Depression - continue Celexa   Diet: carb modified Fluids: NS DVT Prophylaxis: Lovenox  Code Status: Partial (no CPR but short term  Intubation OK)  Family Communication: none  Disposition Plan: inpatient  Time spent: 50  This  note has been created with Surveyor, quantity. Any transcriptional errors are unintentional.   Horatio Bertz M. Cruzita Lederer, MD [facility] Pager [phone]  If 7PM-7AM, please contact night-coverage [email or web address] Password [email or web address] 02/22/2014, 12:07 PM

## 2014-02-22 NOTE — ED Provider Notes (Signed)
CSN: [id number]     Arrival date & time 02/22/14  0911 History   First MD Initiated Contact with Patient 02/22/14 [phone]     Chief Complaint  Patient presents with  . Respiratory Distress   Level V caveat due 2 respiratory distress  (Consider location/radiation/quality/duration/timing/severity/associated sxs/prior Treatment) The history is provided by the patient and the EMS personnel.   patient with acute on chronic shortness of breath. Reportedly been treated for pneumonia. Comes in with respiratory distress. Has had Solu-Medrol, 10 mg of albuterol and 0.5 of Atrovent. Continued tachypnea. His history of COPD. Patient denies any pain.  Past Medical History  Diagnosis Date  . Allergic rhinitis, cause unspecified   . Unspecified essential hypertension   . Cor pulmonale   . COPD (chronic obstructive pulmonary disease)   . Type II or unspecified type diabetes mellitus without mention of complication, not stated as uncontrolled   . Osteoporosis   . Hashimoto's disease   . Peripheral vascular disease   . Hyperlipidemia   . Depression   . Urinary frequency   . Other atopic dermatitis and related conditions   . Abnormal involuntary movements(781.0)   . Hyposmolality and/or hyponatremia   . Peripheral vascular disease, unspecified   . Lung mass   . Insomnia   . History of radiation therapy 08/08/2013, 08/10/2013, 08/16/2013    SBRT 54 Gy to left upper lobe pulmonary nodule   Past Surgical History  Procedure Laterality Date  . Total abdominal hysterectomy    . Cataracts    . Rotator cuff repair    . Mastectomy Right 1952  . Eye surgery     Family History  Problem Relation Age of Onset  . Cancer Mother     lung   . Lupus Father   . Heart disease Sister    History  Substance Use Topics  . Smoking status: Former Smoker -- 2.00 packs/day for 53 years  . Smokeless tobacco: Never Used  . Alcohol Use: No   OB History   Grav Para Term Preterm Abortions TAB SAB Ect Mult Living                  Review of Systems  Unable to perform ROS     Allergies  Celecoxib; Codeine; Metronidazole; Pregabalin; Propranolol hcl; Rofecoxib; Iodine; and Pregabalin  Home Medications   Current Outpatient Rx  Name  Route  Sig  Dispense  Refill  . albuterol (PROVENTIL HFA;VENTOLIN HFA) 108 (90 BASE) MCG/ACT inhaler   Inhalation   Inhale 2 puffs into the lungs every 4 (four) hours as needed for wheezing or shortness of breath.   1 Inhaler   prn   . citalopram (CELEXA) 40 MG tablet      Take one tablet by mouth once daily   90 tablet   1   . clonazePAM (KLONOPIN) 0.5 MG tablet   Oral   Take 0.5-1.5 mg by mouth See admin instructions. Take 1 tablet each morning and 2 tablets at bedtime         . clopidogrel (PLAVIX) 75 MG tablet   Oral   Take 1 tablet (75 mg total) by mouth daily.   30 tablet   5   . ferrous sulfate 325 (65 FE) MG tablet   Oral   Take 325 mg by mouth daily.         . Fluticasone-Salmeterol (ADVAIR) 250-50 MCG/DOSE AEPB   Inhalation   Inhale 1 puff into the lungs 2 (two) times daily.  2 each   0   . furosemide (LASIX) 40 MG tablet   Oral   Take 40 mg by mouth daily.         Marland Kitchen guaiFENesin (MUCINEX) 600 MG 12 hr tablet   Oral   Take 1,200 mg by mouth 2 (two) times daily.         . insulin aspart (NOVOLOG) 100 UNIT/ML injection   Subcutaneous   Inject 5 Units into the skin 3 (three) times daily before meals.          . insulin glargine (LANTUS) 100 UNIT/ML injection   Subcutaneous   Inject 10 Units into the skin at bedtime.           . Insulin Pen Needle 31G X 8 MM MISC      Check blood sugar four times daily. DX: 250.00 and 401.9   120 each   11   . lisinopril (PRINIVIL,ZESTRIL) 10 MG tablet   Oral   Take 10 mg by mouth daily.         . ondansetron (ZOFRAN ODT) 4 MG disintegrating tablet   Oral   Take 1 tablet (4 mg total) by mouth every 8 (eight) hours as needed for nausea.   10 tablet   0   . promethazine  (PHENERGAN) 12.5 MG tablet   Oral   Take 12.5 mg by mouth every 12 (twelve) hours as needed for nausea or vomiting.         . tiotropium (SPIRIVA HANDIHALER) 18 MCG inhalation capsule   Inhalation   Place 1 capsule (18 mcg total) into inhaler and inhale daily.   90 capsule   3   . traMADol (ULTRAM) 50 MG tablet      Take two tablets by mouth at bedtime   180 tablet   1     No refills available    BP 117/48  Pulse 134  Temp(Src) 97.3 F (36.3 C) (Oral)  Resp 30  SpO2 93% Physical Exam  Constitutional: She is oriented to person, place, and time. She appears well-developed and well-nourished.  Eyes: Pupils are equal, round, and reactive to light.  Neck: Neck supple.  Cardiovascular:  Tachycardia  Pulmonary/Chest:  Mild to moderate respiratory distress. Tachypnea. Diffuse wheezes with prolonged expirations.  Abdominal: There is no tenderness.  Musculoskeletal: She exhibits no edema.  Neurological: She is alert and oriented to person, place, and time.  Skin: Skin is warm.    ED Course  Procedures (including critical care time) Labs Review Labs Reviewed  I-STAT CHEM 8, ED - Abnormal; Notable for the following:    Glucose, Bld 132 (*)    Calcium, Ion 1.07 (*)    All other components within normal limits  I-STAT TROPOININ, ED - Abnormal; Notable for the following:    Troponin i, poc 0.10 (*)    All other components within normal limits  CBC WITH DIFFERENTIAL   Imaging Review Dg Chest Port 1 View  02/22/2014   CLINICAL DATA:  sob  EXAM: PORTABLE CHEST - 1 VIEW  COMPARISON:  CT CHEST W/O CM dated 12/08/2013; DG CHEST 2 VIEW dated 11/18/2013  FINDINGS: Lungs are hyperinflated. There is diffuse prominence of the interstitial markings. Previous described area increased density within the left upper hemi thorax has slightly increased in conspicuity low visualization is partially obscured secondary to an overlying cardiac monitoring lead. No focal regions of consolidation  appreciated. Cardiac silhouette is within normal limits. Atherosclerotic calcifications identified within the aorta the  osseous structures are unremarkable. There is lucency within the lung apices.  IMPRESSION: Emphysematous changes within the lungs as well as a underlying component of fibrotic changes. No focal regions of consolidation. The ill-defined area of increased density within the upper left hemi thorax is slightly more conspicuous when compared to the previous study.   Electronically Signed   By: Margaree Mackintosh M.D.   On: 02/22/2014 10:29    EKG Interpretation    Date/Time:  Wednesday February 22 2014 09:18:25 EST Ventricular Rate:  134 PR Interval:  100 QRS Duration: 77 QT Interval:  353 QTC Calculation: 527 R Axis:   84 Text Interpretation:  Sinus tachycardia Borderline right axis deviation Anteroseptal infarct, old ST depression, probably rate related Prolonged QT interval Confirmed by Alvino Chapel  MD, Kaaliyah Kita ([id number]) on 02/22/2014 9:46:13 AM            MDM   Final diagnoses:  COPD (chronic obstructive pulmonary disease)    Patient presents in moderate respiratory distress. Her breathing has improved significantly treatments. She continues to have tachypnea and some tachycardia however. She still sounds tight. No fevers. She's had a mild cough without much production. She has a history of severe COPD. No chest pain. Troponin was barely above normal was likely related to demand. Will be admitted to internal medicine.      Jasper Riling. Alvino Chapel, MD 02/22/14 1152

## 2014-02-22 NOTE — Progress Notes (Signed)
ANTIBIOTIC CONSULT NOTE - INITIAL  Pharmacy Consult for levofloxacin Indication: bronchitis  Allergies  Allergen Reactions  . Celecoxib Swelling  . Codeine Nausea And Vomiting  . Metronidazole Other (See Comments)    neuropathy  . Pregabalin Swelling  . Propranolol Hcl Other (See Comments)    Unknown  . Rofecoxib Other (See Comments)    Unknown  . Iodine Rash    Only if ingested  . Pregabalin Hives and Rash    Patient Measurements: Body weight: 54.6kg on 12/01/13 IBW: 50.1kg  Vital Signs: Temp: 97.9 F (36.6 C) (02/25 1553) Temp src: Oral (02/25 1553) BP: 148/69 mmHg (02/25 1553) Pulse Rate: 101 (02/25 1400)  Labs:  Recent Labs  02/22/14 0950 02/22/14 0957  WBC 9.2  --   HGB 12.8 15.0  PLT 324  --   CREATININE  --  1.00   Medical History: Past Medical History  Diagnosis Date  . Allergic rhinitis, cause unspecified   . Unspecified essential hypertension   . Cor pulmonale   . COPD (chronic obstructive pulmonary disease)   . Type II or unspecified type diabetes mellitus without mention of complication, not stated as uncontrolled   . Osteoporosis   . Hashimoto's disease   . Peripheral vascular disease   . Hyperlipidemia   . Depression   . Urinary frequency   . Other atopic dermatitis and related conditions   . Abnormal involuntary movements(781.0)   . Hyposmolality and/or hyponatremia   . Peripheral vascular disease, unspecified   . Lung mass   . Insomnia   . History of radiation therapy 08/08/2013, 08/10/2013, 08/16/2013    SBRT 54 Gy to left upper lobe pulmonary nodule    Medications:  Scheduled:  . enoxaparin (LOVENOX) injection  40 mg Subcutaneous Q24H  . levofloxacin (LEVAQUIN) IV  750 mg Intravenous Once  . methylPREDNISolone (SOLU-MEDROL) injection  60 mg Intravenous Q12H   Assessment: 84 yoM admitted 2/25 with SOB and subjective low-grade fevers. Pt with Hx of COPD on home O2. SOB improved significantly in the ED after breathing treatments and  Pharmacy has been consulted to dose levofloxacin for suspected bronchitis.  Antiinfectives 2/25 >> levofloxacin >>   Tmax: afebrile WBCs: WNL at 9.2 Renal: SCr 1.0 (baseline appears ~0.9), CrCl 37 ml/min CG, 49 ml/min normlaized  No cultures have been drawn this admission   Goal of Therapy:  appropriate renal dosing of levofloxacin  Plan:  - levofloxacin 750mg  IV q48h - follow-up clinical course, renal function - follow-up antibiotic length of therapy - pharmacy will follow up daily  Thank you for the consult.  Johny Drilling, PharmD, BCPS Pager: [phone] Pharmacy: [phone] 02/22/2014 5:27 PM

## 2014-02-22 NOTE — ED Notes (Signed)
Pt is resting quietly, denies pain, family at bedside.

## 2014-02-23 ENCOUNTER — Inpatient Hospital Stay (HOSPITAL_COMMUNITY): Payer: Medicare HMO

## 2014-02-23 DIAGNOSIS — J9620 Acute and chronic respiratory failure, unspecified whether with hypoxia or hypercapnia: Secondary | ICD-10-CM

## 2014-02-23 DIAGNOSIS — J441 Chronic obstructive pulmonary disease with (acute) exacerbation: Principal | ICD-10-CM

## 2014-02-23 LAB — BLOOD GAS, ARTERIAL
Acid-Base Excess: 4.8 mmol/L — ABNORMAL HIGH (ref 0.0–2.0)
Acid-base deficit: 2 mmol/L (ref 0.0–2.0)
BICARBONATE: 23.3 meq/L (ref 20.0–24.0)
Bicarbonate: 27.6 mEq/L — ABNORMAL HIGH (ref 20.0–24.0)
Delivery systems: POSITIVE
Drawn by: 308601
Drawn by: 331471
Expiratory PAP: 5
FIO2: 0.5 %
Inspiratory PAP: 12
Mode: POSITIVE
O2 Content: 3 L/min
O2 Saturation: 98.9 %
O2 Saturation: 89.2 %
PATIENT TEMPERATURE: 37
PCO2 ART: 44 mmHg (ref 35.0–45.0)
PCO2 ART: 35.3 mmHg (ref 35.0–45.0)
PH ART: 7.344 — AB (ref 7.350–7.450)
Patient temperature: 37
TCO2: 20.8 mmol/L (ref 0–100)
TCO2: 24.5 mmol/L (ref 0–100)
pH, Arterial: 7.505 — ABNORMAL HIGH (ref 7.350–7.450)
pO2, Arterial: 151 mmHg — ABNORMAL HIGH (ref 80.0–100.0)
pO2, Arterial: 53.4 mmHg — ABNORMAL LOW (ref 80.0–100.0)

## 2014-02-23 LAB — CBC
HCT: 42.8 % (ref 36.0–46.0)
HEMOGLOBIN: 13.5 g/dL (ref 12.0–15.0)
MCH: 27.2 pg (ref 26.0–34.0)
MCHC: 31.5 g/dL (ref 30.0–36.0)
MCV: 86.1 fL (ref 78.0–100.0)
Platelets: 396 10*3/uL (ref 150–400)
RBC: 4.97 MIL/uL (ref 3.87–5.11)
RDW: 15.2 % (ref 11.5–15.5)
WBC: 4.8 10*3/uL (ref 4.0–10.5)

## 2014-02-23 LAB — COMPREHENSIVE METABOLIC PANEL
ALK PHOS: 60 U/L (ref 39–117)
ALT: 8 U/L (ref 0–35)
AST: 28 U/L (ref 0–37)
Albumin: 3.4 g/dL — ABNORMAL LOW (ref 3.5–5.2)
BUN: 20 mg/dL (ref 6–23)
CHLORIDE: 96 meq/L (ref 96–112)
CO2: 23 mEq/L (ref 19–32)
Calcium: 9.4 mg/dL (ref 8.4–10.5)
Creatinine, Ser: 0.82 mg/dL (ref 0.50–1.10)
GFR, EST AFRICAN AMERICAN: 75 mL/min — AB (ref >90)
GFR, EST NON AFRICAN AMERICAN: 65 mL/min — AB (ref >90)
GLUCOSE: 190 mg/dL — AB (ref 70–99)
POTASSIUM: 4.4 meq/L (ref 3.7–5.3)
Sodium: 135 mEq/L — ABNORMAL LOW (ref 137–147)
Total Bilirubin: 0.4 mg/dL (ref 0.3–1.2)
Total Protein: 8.1 g/dL (ref 6.0–8.3)

## 2014-02-23 LAB — GLUCOSE, CAPILLARY
Glucose-Capillary: 186 mg/dL — ABNORMAL HIGH (ref 70–99)
Glucose-Capillary: 215 mg/dL — ABNORMAL HIGH (ref 70–99)
Glucose-Capillary: 131 mg/dL — ABNORMAL HIGH (ref 70–99)

## 2014-02-23 LAB — MAGNESIUM: Magnesium: 2.3 mg/dL (ref 1.5–2.5)

## 2014-02-23 LAB — PRO B NATRIURETIC PEPTIDE: Pro B Natriuretic peptide (BNP): 2893 pg/mL — ABNORMAL HIGH (ref 0–450)

## 2014-02-23 LAB — PHOSPHORUS: Phosphorus: 4.4 mg/dL (ref 2.3–4.6)

## 2014-02-23 LAB — PROCALCITONIN: PROCALCITONIN: 0.58 ng/mL

## 2014-02-23 LAB — INFLUENZA PANEL BY PCR (TYPE A & B)
H1N1 flu by pcr: NOT DETECTED
Influenza A By PCR: POSITIVE — AB
Influenza B By PCR: NEGATIVE

## 2014-02-23 MED ORDER — INSULIN GLARGINE 100 UNIT/ML ~~LOC~~ SOLN
8.0000 [IU] | Freq: Every day | SUBCUTANEOUS | Status: DC
  Administered 2014-02-23 – 2014-02-27 (×5): 8 [IU] via SUBCUTANEOUS
  Filled 2014-02-23 (×6): qty 0.08

## 2014-02-23 MED ORDER — LEVALBUTEROL HCL 0.63 MG/3ML IN NEBU
0.6300 mg | INHALATION_SOLUTION | Freq: Three times a day (TID) | RESPIRATORY_TRACT | Status: DC
  Administered 2014-02-23 (×2): 0.63 mg via RESPIRATORY_TRACT
  Filled 2014-02-23 (×5): qty 3

## 2014-02-23 MED ORDER — BUDESONIDE 0.25 MG/2ML IN SUSP
0.2500 mg | Freq: Four times a day (QID) | RESPIRATORY_TRACT | Status: DC
  Administered 2014-02-23 – 2014-02-24 (×4): 0.25 mg via RESPIRATORY_TRACT
  Filled 2014-02-23 (×9): qty 2

## 2014-02-23 MED ORDER — IPRATROPIUM BROMIDE 0.02 % IN SOLN
0.5000 mg | Freq: Four times a day (QID) | RESPIRATORY_TRACT | Status: DC
  Administered 2014-02-23 – 2014-02-28 (×18): 0.5 mg via RESPIRATORY_TRACT
  Filled 2014-02-23 (×20): qty 2.5

## 2014-02-23 MED ORDER — LEVALBUTEROL HCL 0.63 MG/3ML IN NEBU: 0.6300 mg | INHALATION_SOLUTION | RESPIRATORY_TRACT | Status: DC | PRN

## 2014-02-23 MED ORDER — IPRATROPIUM BROMIDE 0.02 % IN SOLN
0.5000 mg | Freq: Three times a day (TID) | RESPIRATORY_TRACT | Status: DC
Start: 2014-02-23 — End: 2014-02-23
  Administered 2014-02-23 (×2): 0.5 mg via RESPIRATORY_TRACT
  Filled 2014-02-23 (×2): qty 2.5

## 2014-02-23 MED ORDER — FUROSEMIDE 10 MG/ML IJ SOLN
40.0000 mg | Freq: Once | INTRAMUSCULAR | Status: AC
  Administered 2014-02-23: 40 mg via INTRAVENOUS

## 2014-02-23 MED ORDER — LEVALBUTEROL HCL 0.63 MG/3ML IN NEBU
0.6300 mg | INHALATION_SOLUTION | Freq: Four times a day (QID) | RESPIRATORY_TRACT | Status: DC
  Administered 2014-02-23 – 2014-02-28 (×18): 0.63 mg via RESPIRATORY_TRACT
  Filled 2014-02-23 (×38): qty 3

## 2014-02-23 MED ORDER — FUROSEMIDE 10 MG/ML IJ SOLN
INTRAMUSCULAR | Status: AC
  Filled 2014-02-23: qty 4

## 2014-02-23 MED ORDER — NITROGLYCERIN 2 % TD OINT
0.5000 [in_us] | TOPICAL_OINTMENT | Freq: Once | TRANSDERMAL | Status: AC
  Administered 2014-02-23: 0.5 [in_us] via TOPICAL
  Filled 2014-02-23: qty 30

## 2014-02-23 MED ORDER — INSULIN ASPART 100 UNIT/ML ~~LOC~~ SOLN
0.0000 [IU] | SUBCUTANEOUS | Status: DC
  Administered 2014-02-23 – 2014-02-24 (×5): 1 [IU] via SUBCUTANEOUS

## 2014-02-23 NOTE — Progress Notes (Signed)
Inpatient Diabetes Program Recommendations  AACE/ADA: New Consensus Statement on Inpatient Glycemic Control (2013)  Target Ranges:  Prepandial:   less than 140 mg/dL      Peak postprandial:   less than 180 mg/dL (1-2 hours)      Critically ill patients:  140 - 180 mg/dL   Reason for Visit: Hyperglycemia  Diabetes history: Type 2 Outpatient Diabetes medications: Lantus 8 units QHS and Novolog S/S Current orders for Inpatient glycemic control: Lantus 8 units QHS and Novolog 3 units tidwc  Hyperglycemia likely d/t steroids.  Inpatient Diabetes Program Recommendations Correction (SSI): Please add Novolog sensitive tidwc for correction insulin HgbA1C: 5.7% - good control  Note: Will follow. Thank you. Lorenda Peck, RD, LDN, CDE Inpatient Diabetes Coordinator [phone]

## 2014-02-23 NOTE — Progress Notes (Signed)
Pt has had very poor appetite, only ate peaches and drank diet coke for lunch.  Said food is not appealing to her and she does not feel like eating.  Pt reswabbed for influenza per MD request.

## 2014-02-23 NOTE — Consult Note (Signed)
Name: Jennifer Duran MRN: [id number] DOB: 09-30-1929    ADMISSION DATE:  02/22/2014 CONSULTATION DATE:  02/23/2014  REFERRING MD :  [facility]  PRIMARY SERVICE: TRH  CHIEF COMPLAINT:  COPD Exacerbation  BRIEF PATIENT DESCRIPTION: 80 year old female with history of COPD, former smoker, patient of CY on 2L at home. Admitted 2/25 for COPD exacerbation. Requiring PRN BiPAP, PCCM asked to see.   SIGNIFICANT EVENTS / STUDIES:  2/25 admitted to [place] / TUBES: PIV  CULTURES: Respiratory viral 2/26 >>>  ANTIBIOTICS: Levaquin 2/25 >>>  HISTORY OF PRESENT ILLNESS:  80 year old female, former smoker, who is followed by Dr Annamaria Boots as an outpatient. She has a history as below, which includes COPD and Lung CA for which she received SBRT to the left upper lobe. Last tx 08/16/13. She is O2 dependent at home 2L/m. She presented to the ED 2/25 after SOB/fever x 2 days. Also had productive cough. She had marked improvement with BD's in the ED so was admitted to the hospitalist service. Since that time she has been requiring PRN BiPAP so PCCM has been asked to see.   PAST MEDICAL HISTORY :  Past Medical History  Diagnosis Date  . Allergic rhinitis, cause unspecified   . Unspecified essential hypertension   . Cor pulmonale   . COPD (chronic obstructive pulmonary disease)   . Type II or unspecified type diabetes mellitus without mention of complication, not stated as uncontrolled   . Osteoporosis   . Hashimoto's disease   . Peripheral vascular disease   . Hyperlipidemia   . Depression   . Urinary frequency   . Other atopic dermatitis and related conditions   . Abnormal involuntary movements(781.0)   . Hyposmolality and/or hyponatremia   . Peripheral vascular disease, unspecified   . Lung mass   . Insomnia   . History of radiation therapy 08/08/2013, 08/10/2013, 08/16/2013    SBRT 54 Gy to left upper lobe pulmonary nodule   Past Surgical History  Procedure Laterality Date  . Total abdominal  hysterectomy    . Cataracts    . Rotator cuff repair    . Mastectomy Right 1952  . Eye surgery     Prior to Admission medications   Medication Sig Start Date End Date Taking? Authorizing Provider  albuterol (PROVENTIL HFA;VENTOLIN HFA) 108 (90 BASE) MCG/ACT inhaler Inhale 2 puffs into the lungs every 4 (four) hours as needed for wheezing or shortness of breath. 11/18/13  Yes Deneise Lever, MD  citalopram (CELEXA) 40 MG tablet Take one tablet by mouth once daily 12/01/13  Yes Tiffany L Reed, DO  clonazePAM (KLONOPIN) 0.5 MG tablet Take 0.5-1.5 mg by mouth See admin instructions. Take 1 tablet each morning and 2 tablets at bedtime   Yes Historical Provider, MD  clopidogrel (PLAVIX) 75 MG tablet Take 1 tablet (75 mg total) by mouth daily. 12/01/13  Yes Tiffany L Reed, DO  ferrous sulfate 325 (65 FE) MG tablet Take 325 mg by mouth daily.   Yes Historical Provider, MD  Fluticasone-Salmeterol (ADVAIR) 250-50 MCG/DOSE AEPB Inhale 1 puff into the lungs 2 (two) times daily. 11/28/13  Yes Deneise Lever, MD  furosemide (LASIX) 40 MG tablet Take 40 mg by mouth daily.   Yes Historical Provider, MD  guaiFENesin (MUCINEX) 600 MG 12 hr tablet Take 1,200 mg by mouth 2 (two) times daily.   Yes Historical Provider, MD  insulin aspart (NOVOLOG) 100 UNIT/ML injection Inject 5 Units into the  skin 3 (three) times daily before meals.    Yes Historical Provider, MD  insulin glargine (LANTUS) 100 UNIT/ML injection Inject 10 Units into the skin at bedtime.     Yes Historical Provider, MD  Insulin Pen Needle 31G X 8 MM MISC Check blood sugar four times daily. DX: 250.00 and 401.9 09/22/13  Yes Pricilla Larsson, NP  lisinopril (PRINIVIL,ZESTRIL) 10 MG tablet Take 10 mg by mouth daily.   Yes Historical Provider, MD  ondansetron (ZOFRAN ODT) 4 MG disintegrating tablet Take 1 tablet (4 mg total) by mouth every 8 (eight) hours as needed for nausea. 07/15/13  Yes Carmin Muskrat, MD  promethazine (PHENERGAN) 12.5 MG tablet Take  12.5 mg by mouth every 12 (twelve) hours as needed for nausea or vomiting.   Yes Historical Provider, MD  tiotropium (SPIRIVA HANDIHALER) 18 MCG inhalation capsule Place 1 capsule (18 mcg total) into inhaler and inhale daily. 05/25/13 05/25/14 Yes Deneise Lever, MD  traMADol Veatrice Bourbon) 50 MG tablet Take two tablets by mouth at bedtime 12/01/13  Yes Tiffany L Reed, DO   Allergies  Allergen Reactions  . Celecoxib Swelling  . Codeine Nausea And Vomiting  . Metronidazole Other (See Comments)    neuropathy  . Pregabalin Swelling  . Propranolol Hcl Other (See Comments)    Unknown  . Rofecoxib Other (See Comments)    Unknown  . Iodine Rash    Only if ingested  . Pregabalin Hives and Rash    FAMILY HISTORY:  Family History  Problem Relation Age of Onset  . Cancer Mother     lung   . Lupus Father   . Heart disease Sister    SOCIAL HISTORY:  reports that she has quit smoking. She has never used smokeless tobacco. She reports that she does not drink alcohol or use illicit drugs.  REVIEW OF SYSTEMS:  Difficult in setting of dyspnea and family not at bedside Bolds are positive  Constitutional: weight loss, gain, night sweats, Fevers, chills, fatigue .  HEENT: headaches, Sore throat, sneezing, nasal congestion, post nasal drip, Difficulty swallowing, Tooth/dental problems, visual complaints visual changes, ear ache CV:  chest pain, radiates: ,Orthopnea, PND, swelling in lower extremities, dizziness, palpitations, syncope.  GI  heartburn, indigestion, abdominal pain, nausea, vomiting, diarrhea, change in bowel habits, loss of appetite, bloody stools.  Resp: cough,dry: , hemoptysis, dyspnea, chest pain, pleuritic.  Skin: rash or itching or icterus GU: dysuria, change in color of urine, urgency or frequency. flank pain, hematuria  MS: joint pain or swelling. decreased range of motion  Psych: change in mood or affect. depression or anxiety.  Neuro: difficulty with speech, weakness, numbness,  ataxia     SUBJECTIVE:   VITAL SIGNS: Temp:  [97.4 F (36.3 C)-98.4 F (36.9 C)] 97.7 F (36.5 C) (02/26 1217) Pulse Rate:  [89-139] 98 (02/26 0800) Resp:  [21-32] 26 (02/26 0800) BP: (121-215)/(51-106) 163/61 mmHg (02/26 1206) SpO2:  [93 %-100 %] 99 % (02/26 0800) FiO2 (%):  [50 %] 50 % (02/26 0749) Weight:  [51.9 kg (114 lb 6.7 oz)] 51.9 kg (114 lb 6.7 oz) (02/25 1732) HEMODYNAMICS:   VENTILATOR SETTINGS: Vent Mode:  [-]  FiO2 (%):  [50 %] 50 % INTAKE / OUTPUT: Intake/Output     02/25 0701 - 02/26 0700 02/26 0701 - 02/27 0700   Urine (mL/kg/hr) 575 200 (0.5)   Total Output 575 200   Net -575 -200        Urine Occurrence 1 x  PHYSICAL EXAMINATION: General:  Female of normal body habitus in moderate respiratory distress Neuro:  Easily arousable, answers questions appropriately HEENT:  [place]/AT, no JVD noted.  Cardiovascular:  RRR, tachy, no peripheral edema.  Lungs:  Occasional rhonchi, crackles throughout.  Abdomen:  Soft, non-distended Musculoskeletal:  No acute deformity Skin: Intact  LABS:  CBC  Recent Labs Lab 02/22/14 0950 02/22/14 0957 02/23/14 0320  WBC 9.2  --  4.8  HGB 12.8 15.0 13.5  HCT 40.6 44.0 42.8  PLT 324  --  396   Coag's No results found for this basename: APTT, INR,  in the last 168 hours BMET  Recent Labs Lab 02/22/14 0957 02/23/14 0320  NA 140 135*  K 3.8 4.4  CL 102 96  CO2  --  23  BUN 15 20  CREATININE 1.00 0.82  GLUCOSE 132* 190*   Electrolytes  Recent Labs Lab 02/23/14 0320  CALCIUM 9.4  MG 2.3  PHOS 4.4   Sepsis Markers No results found for this basename: LATICACIDVEN, PROCALCITON, O2SATVEN,  in the last 168 hours ABG  Recent Labs Lab 02/23/14 0345 02/23/14 1338  PHART 7.344* 7.505*  PCO2ART 44.0 35.3  PO2ART 151.0* 53.4*   Liver Enzymes  Recent Labs Lab 02/23/14 0320  AST 28  ALT 8  ALKPHOS 60  BILITOT 0.4  ALBUMIN 3.4*   Cardiac Enzymes  Recent Labs Lab 02/22/14 1215  02/22/14 1805 02/22/14 2330  TROPONINI <0.30 <0.30 <0.30   Glucose  Recent Labs Lab 02/23/14 0853 02/23/14 1200  GLUCAP 186* 215*    Imaging:  CXR: Bibasilar airspace disease with interstitial prominence L > R  ASSESSMENT / PLAN:  PULMONARY A: Acute Respiratory Failure - pulmonary edema vs COPD exacerbation vs Infection  P:   Supplemental oxygen and PRN BiPAP as needed to keep O2 sat > 90% Respiratory viral panel Increase levalbuterol and and atrovent to q6. Add budesonide.  Continue levaquin Check PCT Check pro BNP 2D echo. IS and FV as tolerated Pulmonary hygiene CXR in AM  CARDIOVASCULAR A:  Hx HTN  P:  Continue home lisinopril, lasix  RENAL A:   No acute issue  P:   Supportive Care  GASTROINTESTINAL A:   No active issue  P:   Supportive Care  HEMATOLOGIC A:   No Acute Issue  P:  Follow CBC  INFECTIOUS A:  ? PNA - unlikely in setting of normal WBCs, CXR appearance, and absent fevers.   P:   Cont levaquin Monitor wbc and fever curve  ENDOCRINE A:   DM    P:   Lantus 8units home dose Check CBG q 4 hours SSI  NEUROLOGIC A:   No acute issue  P:   Monitor  TODAY'S SUMMARY:     PCCM ATTENDING: I have interviewed and examined the patient and reviewed the database. I have formulated the assessment and plan as reflected in the note above with amendments made by me.   Severe COPD with acute exac. I have reviewed medical rx and current plan. I have fine tuned meds a little but for most part will cont current Rx inc PRN NPPV. We will cont to follow with primary team while in ICU/SDU  Merton Border, MD;  PCCM service; Mobile [phone]   Seen with: Georgann Housekeeper, ACNP [facility] Pulmonology/Critical Care Pager [phone] until 4pm or [phone] anytime   02/23/2014, 2:57 PM

## 2014-02-23 NOTE — Progress Notes (Addendum)
eLink Physician-Brief Progress Note Patient Name: Jennifer Duran DOB: 04-29-1929 MRN: [id number]  Date of Service  02/23/2014   HPI/Events of Note  Acute hypoxemic respiratory failure with hypertension Worrisome for flash pulmonary edema Admitted for COPD exacerbation   eICU Interventions  BIPAP CXR ABG Nitropaste Lasix foley   Intervention Category Major Interventions: Respiratory failure - evaluation and management  Jennifer Duran 02/23/2014, 3:18 AM

## 2014-02-23 NOTE — Progress Notes (Signed)
RT removed PT from BiPAP- provided PT with neb tx and mdi. Post resp meds RT placed PT on 3 lpm [place]. RN aware.

## 2014-02-23 NOTE — Progress Notes (Signed)
CARE MANAGEMENT NOTE 02/23/2014  Patient:  Jennifer Duran, Jennifer Duran   Account Number:  [id number]  Date Initiated:  02/23/2014  Documentation initiated by:  Michaelene Dutan  Subjective/Objective Assessment:   copd excerbation  pt required bipap on admission  Fi02 50%     Action/Plan:   lives at [facility]   Anticipated DC Date:  02/25/2014   Anticipated DC Plan:  ASSISTED LIVING / REST HOME  In-house referral  Clinical Social Worker      DC Planning Services  NA      [facility] Choice  NA   Choice offered to / List presented to:  NA   DME arranged  NA      DME agency  NA     [place] arranged  NA      [place] agency  NA   Status of service:  In process, will continue to follow Medicare Important Message given?  NA - LOS <3 / Initial given by admissions (If response is "NO", the following Medicare IM given date fields will be blank) Date Medicare IM given:   Date Additional Medicare IM given:    Discharge Disposition:    Per UR Regulation:  Reviewed for med. necessity/level of care/duration of stay  If discussed at [place] of Stay Meetings, dates discussed:    Comments:  [id number]/Elam Ellis Rosana Hoes, RN, BSN, CCM: CHART REVIEWED AND UPDATED.  Next chart review due on [id number]. NO DISCHARGE NEEDS PRESENT AT THIS TIME WILL CONTINUE TO FOLLOW. CASE MANAGEMENT [phone]

## 2014-02-23 NOTE — Progress Notes (Signed)
PROGRESS NOTE  Jennifer Duran YKD:[id number] DOB: Nov 22, 1929 DOA: 02/22/2014 PCP: Hollace Kinnier, DO  Assessment/Plan: COPD exacerbation  - flu still pending, continue Levofloxacin. Overnight with need for BiPAP due to hypoxia. Wean off back to [place] this morning, repeat ABG. - consult Pulm given persistent hypoxia. Continue steroids. HTN - hold lasix for now restart as indicated.  DM - continue home medicatios  Anxiety - hold clonazepam for now Depression - continue Celexa  Diet: carb modified Fluids: none DVT Prophylaxis: Lovenox  Code Status: Partial Family Communication: none  Disposition Plan: inpatient  Consultants:  Pulmonology  Procedures:  none   Antibiotics  Anti-infectives   Start     Dose/Rate Route Frequency Ordered Stop   02/24/14 1600  levofloxacin (LEVAQUIN) IVPB 750 mg     750 mg 100 mL/hr over 90 Minutes Intravenous Every 48 hours 02/22/14 1728     02/22/14 1645  levofloxacin (LEVAQUIN) IVPB 750 mg     750 mg 100 mL/hr over 90 Minutes Intravenous  Once 02/22/14 1624 02/22/14 1809     Antibiotics Given (last 72 hours)   None      HPI/Subjective: - feeling well, denies breathing difficulties.  Objective: Filed Vitals:   02/23/14 0000 02/23/14 0310 02/23/14 0328 02/23/14 0400  BP: 196/61  215/106 148/71  Pulse: 89  139 116  Temp:      TempSrc:      Resp: 25  32 30  Height:      Weight:      SpO2: 99% 96% 100% 100%    Intake/Output Summary (Last 24 hours) at 02/23/14 0718 Last data filed at 02/23/14 3976  Gross per 24 hour  Intake      0 ml  Output    575 ml  Net   -575 ml   Filed Weights   02/22/14 1732  Weight: 51.9 kg (114 lb 6.7 oz)    Exam:   General:  NAD  Cardiovascular: regular rate and rhythm, without MRG  Respiratory: good air movement, clear to auscultation throughout, no wheezing, ronchi or rales  Abdomen: soft, not tender to palpation, positive bowel sounds  MSK: no peripheral edema  Neuro: CN 2-12  grossly intact, MS 5/5 in all 4  Data Reviewed: Basic Metabolic Panel:  Recent Labs Lab 02/22/14 0957 02/23/14 0320  NA 140 135*  K 3.8 4.4  CL 102 96  CO2  --  23  GLUCOSE 132* 190*  BUN 15 20  CREATININE 1.00 0.82  CALCIUM  --  9.4  MG  --  2.3  PHOS  --  4.4   Liver Function Tests:  Recent Labs Lab 02/23/14 0320  AST 28  ALT 8  ALKPHOS 60  BILITOT 0.4  PROT 8.1  ALBUMIN 3.4*   No results found for this basename: LIPASE, AMYLASE,  in the last 168 hours No results found for this basename: AMMONIA,  in the last 168 hours CBC:  Recent Labs Lab 02/22/14 0950 02/22/14 0957 02/23/14 0320  WBC 9.2  --  4.8  NEUTROABS 5.3  --   --   HGB 12.8 15.0 13.5  HCT 40.6 44.0 42.8  MCV 86.0  --  86.1  PLT 324  --  396   Cardiac Enzymes:  Recent Labs Lab 02/22/14 1215 02/22/14 1805 02/22/14 2330  TROPONINI <0.30 <0.30 <0.30   BNP (last 3 results) No results found for this basename: PROBNP,  in the last 8760 hours CBG: No results found for this basename:  GLUCAP,  in the last 168 hours  Recent Results (from the past 240 hour(s))  MRSA PCR SCREENING     Status: None   Collection Time    02/22/14  6:01 PM      Result Value Ref Range Status   MRSA by PCR NEGATIVE  NEGATIVE Final   Comment:            The GeneXpert MRSA Assay (FDA     approved for NASAL specimens     only), is one component of a     comprehensive MRSA colonization     surveillance program. It is not     intended to diagnose MRSA     infection nor to guide or     monitor treatment for     MRSA infections.   Studies: Dg Chest Port 1 View  02/22/2014   CLINICAL DATA:  sob  EXAM: PORTABLE CHEST - 1 VIEW  COMPARISON:  CT CHEST W/O CM dated 12/08/2013; DG CHEST 2 VIEW dated 11/18/2013  FINDINGS: Lungs are hyperinflated. There is diffuse prominence of the interstitial markings. Previous described area increased density within the left upper hemi thorax has slightly increased in conspicuity low  visualization is partially obscured secondary to an overlying cardiac monitoring lead. No focal regions of consolidation appreciated. Cardiac silhouette is within normal limits. Atherosclerotic calcifications identified within the aorta the osseous structures are unremarkable. There is lucency within the lung apices.  IMPRESSION: Emphysematous changes within the lungs as well as a underlying component of fibrotic changes. No focal regions of consolidation. The ill-defined area of increased density within the upper left hemi thorax is slightly more conspicuous when compared to the previous study.   Electronically Signed   By: Margaree Mackintosh M.D.   On: 02/22/2014 10:29    Scheduled Meds: . citalopram  40 mg Oral Daily  . clonazePAM  0.5 mg Oral BID  . clopidogrel  75 mg Oral Q breakfast  . enoxaparin (LOVENOX) injection  40 mg Subcutaneous Q24H  . ferrous sulfate  325 mg Oral Daily  . furosemide      . furosemide  40 mg Oral Daily  . guaiFENesin  1,200 mg Oral BID  . insulin aspart  3 Units Subcutaneous TID AC  . insulin glargine  8 Units Subcutaneous QHS  . ipratropium  0.5 mg Nebulization TID  . levalbuterol  0.63 mg Nebulization TID  . [START ON 02/24/2014] levofloxacin (LEVAQUIN) IV  750 mg Intravenous Q48H  . lisinopril  10 mg Oral Daily  . methylPREDNISolone (SOLU-MEDROL) injection  60 mg Intravenous Q12H  . mometasone-formoterol  2 puff Inhalation BID   Continuous Infusions:   Principal Problem:   COPD exacerbation Active Problems:   Type II or unspecified type diabetes mellitus without mention of complication, not stated as uncontrolled   HYPERTENSION   COPD with emphysema   DYSPNEA   COPD (chronic obstructive pulmonary disease)  Time spent: 35  This note has been created with Surveyor, quantity. Any transcriptional errors are unintentional.   Marzetta Board, MD [facility] Pager [phone]. If 7 PM - 7 AM, please contact  night-coverage at [email or web address], password [facility] 02/23/2014, 7:18 AM  LOS: 1 day

## 2014-02-24 ENCOUNTER — Inpatient Hospital Stay (HOSPITAL_COMMUNITY): Payer: Medicare HMO

## 2014-02-24 DIAGNOSIS — J438 Other emphysema: Secondary | ICD-10-CM

## 2014-02-24 DIAGNOSIS — I359 Nonrheumatic aortic valve disorder, unspecified: Secondary | ICD-10-CM

## 2014-02-24 LAB — GLUCOSE, CAPILLARY
GLUCOSE-CAPILLARY: 124 mg/dL — AB (ref 70–99)
GLUCOSE-CAPILLARY: 122 mg/dL — AB (ref 70–99)
Glucose-Capillary: 137 mg/dL — ABNORMAL HIGH (ref 70–99)
Glucose-Capillary: 147 mg/dL — ABNORMAL HIGH (ref 70–99)
Glucose-Capillary: 116 mg/dL — ABNORMAL HIGH (ref 70–99)
Glucose-Capillary: 221 mg/dL — ABNORMAL HIGH (ref 70–99)
Glucose-Capillary: 228 mg/dL — ABNORMAL HIGH (ref 70–99)
Glucose-Capillary: 120 mg/dL — ABNORMAL HIGH (ref 70–99)

## 2014-02-24 LAB — BASIC METABOLIC PANEL
BUN: 26 mg/dL — ABNORMAL HIGH (ref 6–23)
CHLORIDE: 99 meq/L (ref 96–112)
CO2: 27 mEq/L (ref 19–32)
Calcium: 8.9 mg/dL (ref 8.4–10.5)
Creatinine, Ser: 0.74 mg/dL (ref 0.50–1.10)
GFR calc Af Amer: 89 mL/min — ABNORMAL LOW (ref >90)
GFR calc non Af Amer: 77 mL/min — ABNORMAL LOW (ref >90)
Glucose, Bld: 133 mg/dL — ABNORMAL HIGH (ref 70–99)
POTASSIUM: 3.5 meq/L — AB (ref 3.7–5.3)
Sodium: 140 mEq/L (ref 137–147)

## 2014-02-24 LAB — PROCALCITONIN: PROCALCITONIN: 0.44 ng/mL

## 2014-02-24 LAB — CBC
HEMATOCRIT: 37.7 % (ref 36.0–46.0)
Hemoglobin: 12.2 g/dL (ref 12.0–15.0)
MCH: 27.2 pg (ref 26.0–34.0)
MCHC: 32.4 g/dL (ref 30.0–36.0)
MCV: 84 fL (ref 78.0–100.0)
PLATELETS: 357 10*3/uL (ref 150–400)
RBC: 4.49 MIL/uL (ref 3.87–5.11)
RDW: 15.2 % (ref 11.5–15.5)
WBC: 9.6 10*3/uL (ref 4.0–10.5)

## 2014-02-24 MED ORDER — INSULIN ASPART 100 UNIT/ML ~~LOC~~ SOLN
0.0000 [IU] | Freq: Every day | SUBCUTANEOUS | Status: DC
  Administered 2014-02-27: 2 [IU] via SUBCUTANEOUS

## 2014-02-24 MED ORDER — INSULIN ASPART 100 UNIT/ML ~~LOC~~ SOLN
0.0000 [IU] | Freq: Three times a day (TID) | SUBCUTANEOUS | Status: DC
  Administered 2014-02-24 (×2): 3 [IU] via SUBCUTANEOUS
  Administered 2014-02-25 (×2): 1 [IU] via SUBCUTANEOUS
  Administered 2014-02-26: 2 [IU] via SUBCUTANEOUS
  Administered 2014-02-26: 1 [IU] via SUBCUTANEOUS
  Administered 2014-02-27: 3 [IU] via SUBCUTANEOUS
  Administered 2014-02-28: 2 [IU] via SUBCUTANEOUS

## 2014-02-24 MED ORDER — PREDNISONE 50 MG PO TABS
60.0000 mg | ORAL_TABLET | Freq: Every day | ORAL | Status: DC
  Administered 2014-02-26 – 2014-02-28 (×3): 60 mg via ORAL
  Filled 2014-02-24 (×6): qty 1

## 2014-02-24 MED ORDER — ALPRAZOLAM 0.25 MG PO TABS
0.2500 mg | ORAL_TABLET | Freq: Two times a day (BID) | ORAL | Status: DC | PRN
  Administered 2014-02-25 – 2014-02-26 (×3): 0.25 mg via ORAL
  Filled 2014-02-24 (×3): qty 1

## 2014-02-24 MED ORDER — LEVOFLOXACIN 500 MG PO TABS
500.0000 mg | ORAL_TABLET | ORAL | Status: AC
  Administered 2014-02-24 – 2014-02-28 (×3): 500 mg via ORAL
  Filled 2014-02-24 (×3): qty 1

## 2014-02-24 MED ORDER — BUDESONIDE 0.25 MG/2ML IN SUSP
0.2500 mg | Freq: Two times a day (BID) | RESPIRATORY_TRACT | Status: DC
  Administered 2014-02-24 – 2014-02-28 (×8): 0.25 mg via RESPIRATORY_TRACT
  Filled 2014-02-24 (×16): qty 2

## 2014-02-24 MED ORDER — OSELTAMIVIR PHOSPHATE 75 MG PO CAPS
75.0000 mg | ORAL_CAPSULE | Freq: Every day | ORAL | Status: AC
  Administered 2014-02-24 – 2014-02-28 (×5): 75 mg via ORAL
  Filled 2014-02-24 (×5): qty 1

## 2014-02-24 NOTE — Progress Notes (Signed)
PROGRESS NOTE  Jennifer Duran EUM:[id number] DOB: [birth year] DOA: 02/22/2014 PCP: Hollace Kinnier, DO  Assessment/Plan: COPD exacerbation  - flu positive, start Tamiflu - continue Levofloxacin.  - consult Pulm given persistent hypoxia, appreciated assistance. Continue steroids. HTN - hold lasix for now restart as indicated.  DM - continue home medicatios  Anxiety - hold clonazepam for now, low dose short acting xanax prn Depression - continue Celexa  Diet: carb modified Fluids: none DVT Prophylaxis: Lovenox  Code Status: Partial Family Communication: none  Disposition Plan: inpatient  Consultants:  Pulmonology  Procedures:  none   Antibiotics  Anti-infectives   Start     Dose/Rate Route Frequency Ordered Stop   02/24/14 1600  levofloxacin (LEVAQUIN) IVPB 750 mg     750 mg 100 mL/hr over 90 Minutes Intravenous Every 48 hours 02/22/14 1728     02/24/14 1000  oseltamivir (TAMIFLU) capsule 75 mg     75 mg Oral Daily 02/24/14 0801 03/01/14 0959   02/22/14 1645  levofloxacin (LEVAQUIN) IVPB 750 mg     750 mg 100 mL/hr over 90 Minutes Intravenous  Once 02/22/14 1624 02/22/14 1809     Antibiotics Given (last 72 hours)   None      HPI/Subjective: - feeling well, denies breathing difficulties.  Objective: Filed Vitals:   02/24/14 0151 02/24/14 0400 02/24/14 0700 02/24/14 0829  BP:  124/62    Pulse:  82    Temp:  97.7 F (36.5 C) 97.7 F (36.5 C)   TempSrc:  Oral Oral   Resp:  15    Height:      Weight:      SpO2: 97% 97%  97%    Intake/Output Summary (Last 24 hours) at 02/24/14 0858 Last data filed at 02/24/14 0700  Gross per 24 hour  Intake    480 ml  Output    920 ml  Net   -440 ml   Filed Weights   02/22/14 1732 02/24/14 0100  Weight: 51.9 kg (114 lb 6.7 oz) 50.5 kg (111 lb 5.3 oz)    Exam:   General:  NAD  Cardiovascular: regular rate and rhythm, without MRG  Respiratory: good air movement, clear to auscultation throughout, no  wheezing, ronchi or rales  Abdomen: soft, not tender to palpation, positive bowel sounds  MSK: no peripheral edema  Neuro: CN 2-12 grossly intact, MS 5/5 in all 4  Data Reviewed: Basic Metabolic Panel:  Recent Labs Lab 02/22/14 0957 02/23/14 0320 02/24/14 0300  NA 140 135* 140  K 3.8 4.4 3.5*  CL 102 96 99  CO2  --  23 27  GLUCOSE 132* 190* 133*  BUN 15 20 26*  CREATININE 1.00 0.82 0.74  CALCIUM  --  9.4 8.9  MG  --  2.3  --   PHOS  --  4.4  --    Liver Function Tests:  Recent Labs Lab 02/23/14 0320  AST 28  ALT 8  ALKPHOS 60  BILITOT 0.4  PROT 8.1  ALBUMIN 3.4*   No results found for this basename: LIPASE, AMYLASE,  in the last 168 hours No results found for this basename: AMMONIA,  in the last 168 hours CBC:  Recent Labs Lab 02/22/14 0950 02/22/14 0957 02/23/14 0320 02/24/14 0300  WBC 9.2  --  4.8 9.6  NEUTROABS 5.3  --   --   --   HGB 12.8 15.0 13.5 12.2  HCT 40.6 44.0 42.8 37.7  MCV 86.0  --  86.1  84.0  PLT 324  --  396 357   Cardiac Enzymes:  Recent Labs Lab 02/22/14 1215 02/22/14 1805 02/22/14 2330  TROPONINI <0.30 <0.30 <0.30   BNP (last 3 results)  Recent Labs  02/23/14 1555  PROBNP 2893.0*   CBG:  Recent Labs Lab 02/23/14 1748 02/23/14 2024 02/23/14 2300 02/24/14 0301 02/24/14 0739  GLUCAP 131* 137* 124* 147* 122*    Recent Results (from the past 240 hour(s))  MRSA PCR SCREENING     Status: None   Collection Time    02/22/14  6:01 PM      Result Value Ref Range Status   MRSA by PCR NEGATIVE  NEGATIVE Final   Comment:            The GeneXpert MRSA Assay (FDA     approved for NASAL specimens     only), is one component of a     comprehensive MRSA colonization     surveillance program. It is not     intended to diagnose MRSA     infection nor to guide or     monitor treatment for     MRSA infections.   Studies: Dg Chest Port 1 View  02/24/2014   CLINICAL DATA:  Dyspnea  EXAM: PORTABLE CHEST - 1 VIEW   COMPARISON:  DG CHEST 1V PORT dated 02/23/2014; DG CHEST 1V PORT dated 02/22/2014; DG CHEST 2 VIEW dated 11/18/2013; CT CHEST W/O CM dated 12/08/2013  FINDINGS: Grossly unchanged cardiac silhouette and mediastinal contours with mild tortuosity of the thoracic aorta. The lungs are hyperexpanded with flattening of bilateral diaphragms and mild diffuse slightly nodular thickening of the pulmonary interstitium. There is asymmetric thinning of the pulmonary parenchyma within the bilateral lung apices, right greater than left. Improved aeration of the left lower lung. No new focal airspace opacities. No pleural effusion or pneumothorax. No definite evidence of edema. Grossly unchanged bones.  IMPRESSION: 1. Resolved left basilar atelectasis. 2. Marked lung hyperexpansion without definite acute cardiopulmonary disease on this AP portable examination.   Electronically Signed   By: Sandi Mariscal M.D.   On: 02/24/2014 07:22   Dg Chest Port 1 View  02/23/2014   CLINICAL DATA:  Acute hypoxemia  EXAM: PORTABLE CHEST - 1 VIEW  COMPARISON:  Prior chest x-ray 02/22/2014  FINDINGS: Stable appearance of the lungs with pulmonary hyper expansion and advanced emphysema with bullous change in the right greater than left upper lungs. No pneumothorax. Slightly increased linear opacity in the left lung base. Overall, the degree of peribronchial thickening and interstitial prominence is slightly progressed compared to 11/18/2013.  Cardiac and mediastinal contours remain unchanged. There is atherosclerotic calcification in the transverse aorta.  IMPRESSION: Slight interval progression of the diffuse bronchial wall thickening and interstitial prominence with a developing linear opacity in the left lung base. Differential considerations include mild interstitial edema with developing left basilar atelectasis, COPD exacerbation with developing left basilar atelectasis, and acute atypical infectious process with developing left basilar atelectasis  versus infiltrate.   Electronically Signed   By: Jacqulynn Cadet M.D.   On: 02/23/2014 07:45   Dg Chest Port 1 View  02/22/2014   CLINICAL DATA:  sob  EXAM: PORTABLE CHEST - 1 VIEW  COMPARISON:  CT CHEST W/O CM dated 12/08/2013; DG CHEST 2 VIEW dated 11/18/2013  FINDINGS: Lungs are hyperinflated. There is diffuse prominence of the interstitial markings. Previous described area increased density within the left upper hemi thorax has slightly increased in conspicuity low visualization  is partially obscured secondary to an overlying cardiac monitoring lead. No focal regions of consolidation appreciated. Cardiac silhouette is within normal limits. Atherosclerotic calcifications identified within the aorta the osseous structures are unremarkable. There is lucency within the lung apices.  IMPRESSION: Emphysematous changes within the lungs as well as a underlying component of fibrotic changes. No focal regions of consolidation. The ill-defined area of increased density within the upper left hemi thorax is slightly more conspicuous when compared to the previous study.   Electronically Signed   By: Margaree Mackintosh M.D.   On: 02/22/2014 10:29    Scheduled Meds: . budesonide  0.25 mg Nebulization Q6H  . citalopram  40 mg Oral Daily  . clopidogrel  75 mg Oral Q breakfast  . enoxaparin (LOVENOX) injection  40 mg Subcutaneous Q24H  . ferrous sulfate  325 mg Oral Daily  . furosemide  40 mg Oral Daily  . insulin aspart  0-9 Units Subcutaneous 6 times per day  . insulin glargine  8 Units Subcutaneous QHS  . ipratropium  0.5 mg Nebulization Q6H  . levalbuterol  0.63 mg Nebulization Q6H  . levofloxacin (LEVAQUIN) IV  750 mg Intravenous Q48H  . lisinopril  10 mg Oral Daily  . methylPREDNISolone (SOLU-MEDROL) injection  60 mg Intravenous Q12H  . oseltamivir  75 mg Oral Daily   Continuous Infusions:   Principal Problem:   COPD exacerbation Active Problems:   Type II or unspecified type diabetes mellitus  without mention of complication, not stated as uncontrolled   HYPERTENSION   COPD with emphysema   DYSPNEA   COPD (chronic obstructive pulmonary disease)   Acute on chronic respiratory failure  Time spent: 25  This note has been created with Surveyor, quantity. Any transcriptional errors are unintentional.   Marzetta Board, MD [facility] Pager [phone]. If 7 PM - 7 AM, please contact night-coverage at [email or web address], password [facility] 02/24/2014, 8:58 AM  LOS: 2 days

## 2014-02-24 NOTE — Progress Notes (Signed)
    Name: Jennifer Duran MRN: [id number] DOB: 19-Sep-1929    ADMISSION DATE:  02/22/2014 CONSULTATION DATE:  02/23/2014  REFERRING MD :  [facility]  PRIMARY SERVICE: [facility]  CHIEF COMPLAINT:  COPD Exacerbation  BRIEF PATIENT DESCRIPTION: 80 year old female with history of COPD, former smoker, patient of CY on 2L at home. Admitted 2/25 for COPD exacerbation. Requiring PRN BiPAP, PCCM asked to see.   SIGNIFICANT EVENTS / STUDIES:  2/25 admitted to [place] / TUBES: PIV  CULTURES: Respiratory viral 2/26 >>   ANTIBIOTICS: Levaquin 2/25 >>    SUBJECTIVE:  Feels better. No new complaints, no distress.  VITAL SIGNS: Temp:  [97.7 F (36.5 C)-97.9 F (36.6 C)] 97.9 F (36.6 C) (02/27 1100) Pulse Rate:  [82-97] 88 (02/27 0900) Resp:  [15-27] 24 (02/27 0900) BP: (109-150)/(62-88) 109/88 mmHg (02/27 0943) SpO2:  [92 %-97 %] 92 % (02/27 0900) Weight:  [50.5 kg (111 lb 5.3 oz)] 50.5 kg (111 lb 5.3 oz) (02/27 0100) HEMODYNAMICS:   VENTILATOR SETTINGS:   INTAKE / OUTPUT: Intake/Output     02/26 0701 - 02/27 0700 02/27 0701 - 02/28 0700   P.O. 240    Other 240    Total Intake(mL/kg) 480 (9.5)    Urine (mL/kg/hr) 1120 (0.9) 300 (0.8)   Total Output 1120 300   Net -640 -300          PHYSICAL EXAMINATION: General:  NAD Neuro: diffusely weak, no focal deficits.  Cardiovascular:  RRR s M Lungs:  diminished throughout, no wheezes Abdomen:  Soft, NT, NABS Ext: warm, no edema  LABS: I have reviewed all of today's lab results. Relevant abnormalities are discussed in the A/P section  CXR: NSC severe hyperinflation  ASSESSMENT / PLAN: A: Acute on chronic Respiratory Failure Severe emphysema AECOPD, resolving Influenza A positive nasal swab (note: was positive in mid January also)  P:   Cont supplemental O2 Cont nebulized steroids and BDs Change steroids to PO and taper to off over next 5-7 days Change abx to PO and complete 5-7 days Appears ready for transfer out of ICU to  med-surg bed Oseltamivir ordered per Dr Cruzita Lederer Already has f/u appt with Dr Annamaria Boots after discharge Rest of medical problems managed by [facility] will sign off. Please call if we can be of further assistance  Merton Border, MD ; [facility] [phone].  After 5:30 PM or weekends, call [phone]

## 2014-02-24 NOTE — Progress Notes (Signed)
Echo Lab  2D Echocardiogram completed.  [place], RDCS 02/24/2014 9:27 AM

## 2014-02-24 NOTE — Progress Notes (Signed)
[id number] Rosana Hoes, RN, BSN, CCM, [phone] Chart reviewed for update of needs and condition.

## 2014-02-24 NOTE — Progress Notes (Signed)
ANTIBIOTIC CONSULT NOTE - Follow up  Pharmacy Consult for levofloxacin Indication: bronchitis  Allergies  Allergen Reactions  . Celecoxib Swelling  . Codeine Nausea And Vomiting  . Metronidazole Other (See Comments)    neuropathy  . Pregabalin Swelling  . Propranolol Hcl Other (See Comments)    Unknown  . Rofecoxib Other (See Comments)    Unknown  . Iodine Rash    Only if ingested  . Pregabalin Hives and Rash    Patient Measurements: Body weight: 50.5kg IBW: 50.1kg  Vital Signs: Temp: 97.7 F (36.5 C) (02/27 0700) Temp src: Oral (02/27 0700) BP: 124/62 mmHg (02/27 0400) Pulse Rate: 82 (02/27 0400)  Labs:  Recent Labs  02/22/14 0950 02/22/14 0957 02/23/14 0320 02/24/14 0300  WBC 9.2  --  4.8 9.6  HGB 12.8 15.0 13.5 12.2  PLT 324  --  396 357  CREATININE  --  1.00 0.82 0.74   Assessment: 84 yoM admitted 2/25 with SOB and subjective low-grade fevers. Pt with Hx of COPD on home O2. SOB improved significantly in the ED after breathing treatments and Pharmacy has been consulted to dose levofloxacin for suspected bronchitis. Noted addition of Tamiflu today per MD due to r/o influenza.  Levofloxacin changed to 500mg  PO daily by CCM MD this AM x 5 doses, dose has been adjusted for renal function  Antiinfectives 2/25 >> levofloxacin >>  2/27 >> Tamiflu >> (5 days)   Tmax: remains afebrile WBCs: WNL at 9.6 Renal: SCr 0.82 (baseline appears ~0.9), CrCl 43 ml/min CG, 72 ml/min normlaized  Microbiology: 2/25 MRSA PCR (-) 2/25 Influenza PCR: collected   Goal of Therapy:  appropriate renal dosing of levofloxacin  Plan:  - levofloxacin 500mg  PO q48h x 3 more doses - follow-up clinical course, renal function - follow-up antibiotic length of therapy - pharmacy will follow up daily  Thank you for the consult.  Johny Drilling, PharmD, BCPS Pager: [phone] Pharmacy: [phone] 02/24/2014 9:41 AM

## 2014-02-24 NOTE — Progress Notes (Signed)
Clinical Social Work Department BRIEF PSYCHOSOCIAL ASSESSMENT 02/24/2014  Patient:  Jennifer Duran, Jennifer Duran     Account Number:  [id number]     Admit date:  02/22/2014  Clinical Social Worker:  Ulyess Blossom  Date/Time:  02/24/2014 11:30 AM  Referred by:  Physician  Date Referred:  02/24/2014 Referred for  ALF Placement   Other Referral:   Interview type:  Patient Other interview type:   and patient daughter via telephone    PSYCHOSOCIAL DATA Living Status:  FACILITY Admitted from facility:  [facility] Level of care:  Assisted Living Primary support name:  Jennifer Duran/daughter/[phone] Primary support relationship to patient:  CHILD, ADULT Degree of support available:   strong    CURRENT CONCERNS Current Concerns  Post-Acute Placement   Other Concerns:    SOCIAL WORK ASSESSMENT / PLAN CSW received referral that pt admitted from [facility].    CSW met with pt at bedside. CSW introduced self and explained role. Pt confirmed that she was a resident at [facility]. Pt expressed that she was slowly starting to feel better as she has not been feeling well since Sunday. Pt agreeable to returning to [facility] as long as facility can continue to meet pt medical needs. Pt agreeable to CSW contacting pt daughter via telephone.    CSW contacted pt daughter via telephone. CSW introduced self and explained role. Pt daughter discussed that pt has been a resident at [facility] since January 3rd. Pt daughter discussed that pt care has been going well at ALF, but pt daughter did have concerns about pt not being seen by doctor at facility when pt began to experience symptoms. CSW encouraged pt daughter to address concerns with facility and pt daughter agreeable to CSW discussing concerns as well. Pt daughter discussed that pt walks with a walker at ALF and was receiving physical therapy at ALF. CSW discussed that CSW has recommended PT evaluatuion when pt stable  to participate in order to make sure that pt is at baseline with ambulations and make sure that pt does not require a higher level of care. Pt daughter agreeable to pt returning to [facility] as long as PT does not determine that pt needs higher level of care.    CSW to contacted [facility] and spoke to resident care coordinator, Pamala Hurry. [facility] agreeable to pt returning to facility. CSW discussed with facility that pt positive Influenza A and facility feels they can continue to meet pt needs as pt is currently in a room by herself and facility just wants to be advised on what isolation precautions are needed at the ALF upon pt return. CSW notified resident care coordinator, Pamala Hurry of pt daughter concerns and ALF plans to address with pt daughter.    CSW to follow up when PT evaluates pt to confirm that pt able to return to [facility].    CSW to continue to follow to assist with disposition planning.   Assessment/plan status:  Psychosocial Support/Ongoing Assessment of Needs Other assessment/ plan:   discharge planning   Information/referral to community resources:   Referral back to [facility].    PATIENT'S/FAMILY'S RESPONSE TO PLAN OF CARE: Pt alert and oriented x 3. Pt appears to have some periods of confusion as pt reported to this CSW that she had only been at facility for one week. Pt daughter appears supportive and actively involved on care. Encouraged pt daughter to address concerns with ALF  regarding pt being seen by a doctor in a timely manner. Pt daughter agreeable to pt return to ALF as long as PT evaluation determines ALF can continue to meet pt needs.    Alison Murray, MSW, [place] Work [phone]

## 2014-02-25 LAB — BASIC METABOLIC PANEL
BUN: 26 mg/dL — AB (ref 6–23)
CHLORIDE: 101 meq/L (ref 96–112)
CO2: 29 meq/L (ref 19–32)
Calcium: 8.6 mg/dL (ref 8.4–10.5)
Creatinine, Ser: 0.7 mg/dL (ref 0.50–1.10)
GFR calc Af Amer: >90 mL/min (ref >90)
GFR calc non Af Amer: 79 mL/min — ABNORMAL LOW (ref >90)
GLUCOSE: 166 mg/dL — AB (ref 70–99)
Potassium: 3.5 mEq/L — ABNORMAL LOW (ref 3.7–5.3)
Sodium: 142 mEq/L (ref 137–147)

## 2014-02-25 LAB — GLUCOSE, CAPILLARY
GLUCOSE-CAPILLARY: 178 mg/dL — AB (ref 70–99)
GLUCOSE-CAPILLARY: 142 mg/dL — AB (ref 70–99)
GLUCOSE-CAPILLARY: 140 mg/dL — AB (ref 70–99)
Glucose-Capillary: 128 mg/dL — ABNORMAL HIGH (ref 70–99)
Glucose-Capillary: 134 mg/dL — ABNORMAL HIGH (ref 70–99)
Glucose-Capillary: 109 mg/dL — ABNORMAL HIGH (ref 70–99)

## 2014-02-25 MED ORDER — AMLODIPINE BESYLATE 5 MG PO TABS
5.0000 mg | ORAL_TABLET | Freq: Every day | ORAL | Status: DC
  Filled 2014-02-25: qty 1

## 2014-02-25 MED ORDER — LISINOPRIL 10 MG PO TABS
10.0000 mg | ORAL_TABLET | Freq: Once | ORAL | Status: AC
  Administered 2014-02-25: 10 mg via ORAL
  Filled 2014-02-25: qty 1

## 2014-02-25 MED ORDER — POTASSIUM CHLORIDE CRYS ER 20 MEQ PO TBCR
40.0000 meq | EXTENDED_RELEASE_TABLET | Freq: Once | ORAL | Status: AC
  Administered 2014-02-25: 40 meq via ORAL
  Filled 2014-02-25: qty 2

## 2014-02-25 MED ORDER — SENNOSIDES-DOCUSATE SODIUM 8.6-50 MG PO TABS: 1.0000 | ORAL_TABLET | Freq: Three times a day (TID) | ORAL | Status: DC | PRN

## 2014-02-25 MED ORDER — LISINOPRIL 20 MG PO TABS
20.0000 mg | ORAL_TABLET | Freq: Every day | ORAL | Status: DC
  Administered 2014-02-26 – 2014-02-28 (×3): 20 mg via ORAL
  Filled 2014-02-25 (×3): qty 1

## 2014-02-25 MED ORDER — HYDRALAZINE HCL 20 MG/ML IJ SOLN
5.0000 mg | INTRAMUSCULAR | Status: DC | PRN
  Administered 2014-02-25 (×2): 5 mg via INTRAVENOUS
  Filled 2014-02-25 (×2): qty 0.25

## 2014-02-25 NOTE — Progress Notes (Signed)
PROGRESS NOTE  Jennifer Duran TKZ:[id number] DOB: October 14, 1929 DOA: 02/22/2014 PCP: Hollace Kinnier, DO  Assessment/Plan:  COPD exacerbation  - flu positive, continue Tamiflu for a total of 5 days, today day 2/5 - continue Levofloxacin, 2 more doses. - consulted Pulm given persistent hypoxia, appreciated assistance. Continue steroids. HTN  - patient persistently hypertensive today, add hydralazine prn for SBP > 180 - increase lisinopril to 20 mg daily including 10 mg this afternoon DM - continue home medicatios  Anxiety - hold clonazepam for now, low dose short acting xanax prn Depression - continue Celexa  Diet: carb modified Fluids: none DVT Prophylaxis: Lovenox  Code Status: Partial Family Communication: daughter over the phone Disposition Plan: inpatient  Consultants:  Pulmonology  Procedures:  none   Antibiotics  Anti-infectives   Start     Dose/Rate Route Frequency Ordered Stop   02/24/14 1600  levofloxacin (LEVAQUIN) IVPB 750 mg  Status:  Discontinued     750 mg 100 mL/hr over 90 Minutes Intravenous Every 48 hours 02/22/14 1728 02/24/14 0928   02/24/14 1030  levofloxacin (LEVAQUIN) tablet 500 mg     500 mg Oral Every other day 02/24/14 0928 03/02/14 0959   02/24/14 1000  oseltamivir (TAMIFLU) capsule 75 mg     75 mg Oral Daily 02/24/14 0801 03/01/14 0959   02/22/14 1645  levofloxacin (LEVAQUIN) IVPB 750 mg     750 mg 100 mL/hr over 90 Minutes Intravenous  Once 02/22/14 1624 02/22/14 1809     Antibiotics Given (last 72 hours)   Date/Time Action Medication Dose   02/24/14 0943 Given   oseltamivir (TAMIFLU) capsule 75 mg 75 mg   02/24/14 1050 Given   levofloxacin (LEVAQUIN) tablet 500 mg 500 mg      HPI/Subjective: - feeling well this morning, without complaints  Objective: Filed Vitals:   02/25/14 0100 02/25/14 0300 02/25/14 0400 02/25/14 0500  BP:   137/106   Pulse: 80 72 78 72  Temp:   97.7 F (36.5 C)   TempSrc:   Oral   Resp: 17 18 25 20    Height:      Weight:      SpO2: 95% 98% 94% 98%    Intake/Output Summary (Last 24 hours) at 02/25/14 0736 Last data filed at 02/24/14 2200  Gross per 24 hour  Intake     40 ml  Output   1150 ml  Net  -1110 ml   Filed Weights   02/22/14 1732 02/24/14 0100 02/25/14 0000  Weight: 51.9 kg (114 lb 6.7 oz) 50.5 kg (111 lb 5.3 oz) 50 kg (110 lb 3.7 oz)    Exam:  General:  NAD  Cardiovascular: regular rate and rhythm, without MRG  Respiratory: good air movement, clear to auscultation throughout, no wheezing, ronchi or rales; distant breath sounds.   Abdomen: soft, not tender to palpation, positive bowel sounds  MSK: no peripheral edema  Neuro: CN 2-12 grossly intact, MS 5/5 in all 4  Data Reviewed: Basic Metabolic Panel:  Recent Labs Lab 02/22/14 0957 02/23/14 0320 02/24/14 0300 02/25/14 0400  NA 140 135* 140 142  K 3.8 4.4 3.5* 3.5*  CL 102 96 99 101  CO2  --  23 27 29   GLUCOSE 132* 190* 133* 166*  BUN 15 20 26* 26*  CREATININE 1.00 0.82 0.74 0.70  CALCIUM  --  9.4 8.9 8.6  MG  --  2.3  --   --   PHOS  --  4.4  --   --  Liver Function Tests:  Recent Labs Lab 02/23/14 0320  AST 28  ALT 8  ALKPHOS 60  BILITOT 0.4  PROT 8.1  ALBUMIN 3.4*   CBC:  Recent Labs Lab 02/22/14 0950 02/22/14 0957 02/23/14 0320 02/24/14 0300  WBC 9.2  --  4.8 9.6  NEUTROABS 5.3  --   --   --   HGB 12.8 15.0 13.5 12.2  HCT 40.6 44.0 42.8 37.7  MCV 86.0  --  86.1 84.0  PLT 324  --  396 357   Cardiac Enzymes:  Recent Labs Lab 02/22/14 1215 02/22/14 1805 02/22/14 2330  TROPONINI <0.30 <0.30 <0.30   BNP (last 3 results)  Recent Labs  02/23/14 1555  PROBNP 2893.0*   CBG:  Recent Labs Lab 02/24/14 0424 02/24/14 0739 02/24/14 1120 02/24/14 1725 02/24/14 2012  GLUCAP 116* 122* 221* 228* 120*    Recent Results (from the past 240 hour(s))  MRSA PCR SCREENING     Status: None   Collection Time    02/22/14  6:01 PM      Result Value Ref Range Status    MRSA by PCR NEGATIVE  NEGATIVE Final   Comment:            The GeneXpert MRSA Assay (FDA     approved for NASAL specimens     only), is one component of a     comprehensive MRSA colonization     surveillance program. It is not     intended to diagnose MRSA     infection nor to guide or     monitor treatment for     MRSA infections.   Studies: Dg Chest Port 1 View  02/24/2014   CLINICAL DATA:  Dyspnea  EXAM: PORTABLE CHEST - 1 VIEW  COMPARISON:  DG CHEST 1V PORT dated 02/23/2014; DG CHEST 1V PORT dated 02/22/2014; DG CHEST 2 VIEW dated 11/18/2013; CT CHEST W/O CM dated 12/08/2013  FINDINGS: Grossly unchanged cardiac silhouette and mediastinal contours with mild tortuosity of the thoracic aorta. The lungs are hyperexpanded with flattening of bilateral diaphragms and mild diffuse slightly nodular thickening of the pulmonary interstitium. There is asymmetric thinning of the pulmonary parenchyma within the bilateral lung apices, right greater than left. Improved aeration of the left lower lung. No new focal airspace opacities. No pleural effusion or pneumothorax. No definite evidence of edema. Grossly unchanged bones.  IMPRESSION: 1. Resolved left basilar atelectasis. 2. Marked lung hyperexpansion without definite acute cardiopulmonary disease on this AP portable examination.   Electronically Signed   By: Sandi Mariscal M.D.   On: 02/24/2014 07:22   Scheduled Meds: . budesonide  0.25 mg Nebulization BID  . citalopram  40 mg Oral Daily  . clopidogrel  75 mg Oral Q breakfast  . enoxaparin (LOVENOX) injection  40 mg Subcutaneous Q24H  . ferrous sulfate  325 mg Oral Daily  . furosemide  40 mg Oral Daily  . insulin aspart  0-5 Units Subcutaneous QHS  . insulin aspart  0-9 Units Subcutaneous TID WC  . insulin glargine  8 Units Subcutaneous QHS  . ipratropium  0.5 mg Nebulization Q6H  . levalbuterol  0.63 mg Nebulization Q6H  . levofloxacin  500 mg Oral QODAY  . lisinopril  10 mg Oral Daily  .  oseltamivir  75 mg Oral Daily  . predniSONE  60 mg Oral Q breakfast   Continuous Infusions:   Principal Problem:   COPD exacerbation Active Problems:   Type II or unspecified type  diabetes mellitus without mention of complication, not stated as uncontrolled   HYPERTENSION   COPD with emphysema   DYSPNEA   COPD (chronic obstructive pulmonary disease)   Acute on chronic respiratory failure  Time spent: 25  This note has been created with Surveyor, quantity. Any transcriptional errors are unintentional.   Marzetta Board, MD [facility] Pager [phone]. If 7 PM - 7 AM, please contact night-coverage at [email or web address], password [facility] 02/25/2014, 7:36 AM  LOS: 3 days

## 2014-02-26 LAB — RESPIRATORY VIRUS PANEL
ADENOVIRUS: NOT DETECTED
INFLUENZA A H3: DETECTED — AB
Influenza A H1: NOT DETECTED
Influenza A: DETECTED — AB
Influenza B: NOT DETECTED
Metapneumovirus: NOT DETECTED
PARAINFLUENZA 1 A: NOT DETECTED
Parainfluenza 2: NOT DETECTED
Parainfluenza 3: NOT DETECTED
RESPIRATORY SYNCYTIAL VIRUS A: NOT DETECTED
RESPIRATORY SYNCYTIAL VIRUS B: NOT DETECTED
RHINOVIRUS: NOT DETECTED

## 2014-02-26 LAB — GLUCOSE, CAPILLARY
GLUCOSE-CAPILLARY: 132 mg/dL — AB (ref 70–99)
Glucose-Capillary: 85 mg/dL (ref 70–99)
Glucose-Capillary: 176 mg/dL — ABNORMAL HIGH (ref 70–99)
Glucose-Capillary: 184 mg/dL — ABNORMAL HIGH (ref 70–99)

## 2014-02-26 MED ORDER — CITALOPRAM HYDROBROMIDE 20 MG PO TABS
20.0000 mg | ORAL_TABLET | Freq: Every day | ORAL | Status: DC
  Administered 2014-02-27 – 2014-02-28 (×2): 20 mg via ORAL
  Filled 2014-02-26 (×2): qty 1

## 2014-02-26 NOTE — Progress Notes (Addendum)
PT stating that Pt needs SNF.  Notified Pt and Pt''s daughter.  SNF list left for Pt and daughter's review in Pt's room.  Weekday CSW to follow.  SNF search initiated.  Bernita Raisin, [place] [facility] [phone]

## 2014-02-26 NOTE — Evaluation (Signed)
Physical Therapy Evaluation Patient Details Name: Jennifer Duran MRN: [id number] DOB: 20-Sep-1929 Today's Date: 02/26/2014 Time: 6010-9323 PT Time Calculation (min): 19 min  PT Assessment / Plan / Recommendation History of Present Illness  80 y.o. female admitted from ALF with COPD exacerbation, flu.   Clinical Impression  *Pt admitted with *COPD exacerbation, flu*. Pt currently with functional limitations due to the deficits listed below (see PT Problem List).  Pt will benefit from skilled PT to increase their independence and safety with mobility to allow discharge to the venue listed below.   **    PT Assessment  Patient needs continued PT services    Follow Up Recommendations  SNF (assist needed for mobility)    Does the patient have the potential to tolerate intense rehabilitation      Barriers to Discharge        Equipment Recommendations  None recommended by PT    Recommendations for Other Services     Frequency Min 3X/week    Precautions / Restrictions Precautions Precautions: Fall Precaution Comments: monitor O2 Restrictions Weight Bearing Restrictions: No   Pertinent Vitals/Pain *SaO2 92% on 2L at rest SaO2 86% on 2L with bed to recliner transfer 0/10 pain**      Mobility  Bed Mobility Overal bed mobility: Needs Assistance Bed Mobility: Supine to Sit Supine to sit: Min assist General bed mobility comments: assist to pivot hips to EOB Transfers Overall transfer level: Needs assistance Equipment used: Rolling walker (2 wheeled) Transfers: Sit to/from Stand Sit to Stand: Mod assist General transfer comment: assist to rise Ambulation/Gait Ambulation/Gait assistance: Min assist Ambulation Distance (Feet): 3 Feet Assistive device: Rolling walker (2 wheeled) Gait Pattern/deviations: Step-to pattern;Decreased step length - right;Decreased step length - left Gait velocity interpretation: Below normal speed for age/gender General Gait Details: distance  limited by fatigue, SOB, LE weakness, noted RLE buckling    Exercises     PT Diagnosis: Difficulty walking;Generalized weakness  PT Problem List: Decreased strength;Decreased activity tolerance;Decreased mobility;Cardiopulmonary status limiting activity PT Treatment Interventions: Gait training;DME instruction;Functional mobility training;Therapeutic exercise;Therapeutic activities;Patient/family education     PT Goals(Current goals can be found in the care plan section) Acute Rehab PT Goals Patient Stated Goal: to walk independently PT Goal Formulation: With patient Time For Goal Achievement: 03/12/14 Potential to Achieve Goals: Fair  Visit Information  Last PT Received On: 02/26/14 Assistance Needed: +1 History of Present Illness: 80 y.o. female admitted from ALF with COPD exacerbation, flu.        Prior Functioning  Home Living Family/patient expects to be discharged to:: Assisted living Home Equipment: Walker - 4 wheels Additional Comments: Has been on O2 x 10 years, uses 2L during day and 8L overnight Prior Function Level of Independence: Needs assistance Gait / Transfers Assistance Needed: supervision for walking with RW 2* LEs would buckle ADL's / Homemaking Assistance Needed: assist for bathing and dressing Communication Communication: No difficulties    Cognition  Cognition Arousal/Alertness: Awake/alert Behavior During Therapy: WFL for tasks assessed/performed Overall Cognitive Status: Within Functional Limits for tasks assessed    Extremity/Trunk Assessment Upper Extremity Assessment Upper Extremity Assessment: Generalized weakness Lower Extremity Assessment Lower Extremity Assessment: Generalized weakness (4/5 manual muscle test, but BLEs buckle after standing 1 minute) Cervical / Trunk Assessment Cervical / Trunk Assessment: Kyphotic   Balance    End of Session PT - End of Session Equipment Utilized During Treatment: Gait belt Activity Tolerance: Patient  limited by fatigue Patient left: in chair;with call bell/phone within reach Nurse  Communication: Mobility status  GP     Blondell Reveal Kistler 02/26/2014, 12:53 PM [phone]

## 2014-02-26 NOTE — Progress Notes (Addendum)
Clinical Social Work Department CLINICAL SOCIAL WORK PLACEMENT NOTE 02/26/2014  Patient:  KANYLA, OMEARA  Account Number:  [id number] Admit date:  02/22/2014  Clinical Social Worker:  Levie Heritage  Date/time:  02/26/2014 03:47 PM  Clinical Social Work is seeking post-discharge placement for this patient at the following level of care:   SKILLED NURSING   (*CSW will update this form in Epic as items are completed)   02/26/2014  Patient/family provided with [place] Department of Clinical Social Work's list of facilities offering this level of care within the geographic area requested by the patient (or if unable, by the patient's family).  02/26/2014  Patient/family informed of their freedom to choose among providers that offer the needed level of care, that participate in Medicare, Medicaid or managed care program needed by the patient, have an available bed and are willing to accept the patient.  02/26/2014  Patient/family informed of [facility]' ownership interest in [facility], as well as of the fact that they are under no obligation to receive care at this facility.  PASARR submitted to EDS on 02/26/2014 PASARR number received from EDS on 02/26/2014  FL2 transmitted to all facilities in geographic area requested by pt/family on  02/26/2014 FL2 transmitted to all facilities within larger geographic area on   Patient informed that his/her managed care company has contracts with or will negotiate with  certain facilities, including the following:     Patient/family informed of bed offers received:  02/27/2014 Patient chooses bed at [place] Physician recommends and patient chooses bed at    Patient to be transferred to  on  [place] on 02/28/2014 Patient to be transferred to facility by ambulance [facility])  The following physician request were entered in Epic:   Additional Comments:  Alison Murray, MSW,  [place] [facility] [phone]

## 2014-02-26 NOTE — Progress Notes (Signed)
PROGRESS NOTE  Jennifer Duran JEH:[id number] DOB: 1929/06/27 DOA: 02/22/2014 PCP: Hollace Kinnier, DO  Assessment/Plan:  COPD exacerbation  - flu positive, continue Tamiflu for a total of 5 days, today day 2/5 - continue Levofloxacin, 2 more doses. - Pulm evaluated patient while hospitalized.  - still very weak and deconditioned, PT recommending SNF. SW consult.  Influenza - Tamiflu.  HTN  - patient persistently hypertensive today, increased lisinopril to 20 mg daily DM - continue home medicatios  Anxiety - hold clonazepam for now, low dose short acting xanax prn Depression - continue Celexa  Diet: carb modified Fluids: none DVT Prophylaxis: Lovenox  Code Status: Partial Family Communication: none Disposition Plan: inpatient  Consultants:  Pulmonology   Procedures:  none   Antibiotics  Anti-infectives   Start     Dose/Rate Route Frequency Ordered Stop   02/24/14 1600  levofloxacin (LEVAQUIN) IVPB 750 mg  Status:  Discontinued     750 mg 100 mL/hr over 90 Minutes Intravenous Every 48 hours 02/22/14 1728 02/24/14 0928   02/24/14 1030  levofloxacin (LEVAQUIN) tablet 500 mg     500 mg Oral Every other day 02/24/14 0928 03/02/14 0959   02/24/14 1000  oseltamivir (TAMIFLU) capsule 75 mg     75 mg Oral Daily 02/24/14 0801 03/01/14 0959   02/22/14 1645  levofloxacin (LEVAQUIN) IVPB 750 mg     750 mg 100 mL/hr over 90 Minutes Intravenous  Once 02/22/14 1624 02/22/14 1809     Antibiotics Given (last 72 hours)   Date/Time Action Medication Dose   02/24/14 0943 Given   oseltamivir (TAMIFLU) capsule 75 mg 75 mg   02/24/14 1050 Given   levofloxacin (LEVAQUIN) tablet 500 mg 500 mg   02/25/14 1020 Given   oseltamivir (TAMIFLU) capsule 75 mg 75 mg      HPI/Subjective: - feeling well this morning, without complaints, but endorses weakness  Objective: Filed Vitals:   02/25/14 1920 02/25/14 2059 02/26/14 0202 02/26/14 0457  BP:  195/69  148/55  Pulse:  93  76    Temp:  97.9 F (36.6 C)  98 F (36.7 C)  TempSrc:  Oral  Oral  Resp:  18  16  Height:      Weight:      SpO2: 93% 94% 95% 96%    Intake/Output Summary (Last 24 hours) at 02/26/14 0754 Last data filed at 02/26/14 0458  Gross per 24 hour  Intake    660 ml  Output   1450 ml  Net   -790 ml   Filed Weights   02/22/14 1732 02/24/14 0100 02/25/14 0000  Weight: 51.9 kg (114 lb 6.7 oz) 50.5 kg (111 lb 5.3 oz) 50 kg (110 lb 3.7 oz)   Exam:  General:  NAD  Cardiovascular: regular rate and rhythm, without MRG  Respiratory: good air movement, clear to auscultation throughout, no wheezing, ronchi or rales; distant breath sounds.   Abdomen: soft, not tender to palpation, positive bowel sounds  MSK: no peripheral edema  Neuro: CN 2-12 grossly intact, MS 5/5 in all 4  Data Reviewed: Basic Metabolic Panel:  Recent Labs Lab 02/22/14 0957 02/23/14 0320 02/24/14 0300 02/25/14 0400  NA 140 135* 140 142  K 3.8 4.4 3.5* 3.5*  CL 102 96 99 101  CO2  --  23 27 29   GLUCOSE 132* 190* 133* 166*  BUN 15 20 26* 26*  CREATININE 1.00 0.82 0.74 0.70  CALCIUM  --  9.4 8.9 8.6  MG  --  2.3  --   --   PHOS  --  4.4  --   --    Liver Function Tests:  Recent Labs Lab 02/23/14 0320  AST 28  ALT 8  ALKPHOS 60  BILITOT 0.4  PROT 8.1  ALBUMIN 3.4*   CBC:  Recent Labs Lab 02/22/14 0950 02/22/14 0957 02/23/14 0320 02/24/14 0300  WBC 9.2  --  4.8 9.6  NEUTROABS 5.3  --   --   --   HGB 12.8 15.0 13.5 12.2  HCT 40.6 44.0 42.8 37.7  MCV 86.0  --  86.1 84.0  PLT 324  --  396 357   Cardiac Enzymes:  Recent Labs Lab 02/22/14 1215 02/22/14 1805 02/22/14 2330  TROPONINI <0.30 <0.30 <0.30   BNP (last 3 results)  Recent Labs  02/23/14 1555  PROBNP 2893.0*   CBG:  Recent Labs Lab 02/25/14 0758 02/25/14 1149 02/25/14 1705 02/25/14 2146 02/26/14 0743  GLUCAP 142* 134* 109* 140* 85    Recent Results (from the past 240 hour(s))  MRSA PCR SCREENING     Status: None    Collection Time    02/22/14  6:01 PM      Result Value Ref Range Status   MRSA by PCR NEGATIVE  NEGATIVE Final   Comment:            The GeneXpert MRSA Assay (FDA     approved for NASAL specimens     only), is one component of a     comprehensive MRSA colonization     surveillance program. It is not     intended to diagnose MRSA     infection nor to guide or     monitor treatment for     MRSA infections.  RESPIRATORY VIRUS PANEL     Status: Abnormal   Collection Time    02/23/14  5:40 PM      Result Value Ref Range Status   Source - RVPAN Not Provided   Final   Respiratory Syncytial Virus A NOT DETECTED   Final   Respiratory Syncytial Virus B NOT DETECTED   Final   Influenza A DETECTED (*)  Final   Influenza B NOT DETECTED   Final   Parainfluenza 1 NOT DETECTED   Final   Parainfluenza 2 NOT DETECTED   Final   Parainfluenza 3 NOT DETECTED   Final   Metapneumovirus NOT DETECTED   Final   Rhinovirus NOT DETECTED   Final   Adenovirus NOT DETECTED   Final   Influenza A H1 NOT DETECTED   Final   Influenza A H3 DETECTED (*)  Final   Comment: (NOTE)           Normal Reference Range for each Analyte: NOT DETECTED     Testing performed using the Luminex xTAG Respiratory Viral Panel test     kit.     This test was developed and its performance characteristics determined     by [employer]. It has not been cleared or approved by the [place]     Food and Drug Administration. This test is used for clinical purposes.     It should not be regarded as investigational or for research. This     laboratory is certified under the [place] (CLIA) as qualified to perform high complexity     clinical laboratory testing.     Performed at [facility]  Meds: . budesonide  0.25 mg Nebulization BID  . citalopram  40 mg Oral Daily  . clopidogrel  75 mg Oral Q breakfast  . enoxaparin (LOVENOX) injection  40 mg Subcutaneous Q24H   . ferrous sulfate  325 mg Oral Daily  . furosemide  40 mg Oral Daily  . insulin aspart  0-5 Units Subcutaneous QHS  . insulin aspart  0-9 Units Subcutaneous TID WC  . insulin glargine  8 Units Subcutaneous QHS  . ipratropium  0.5 mg Nebulization Q6H  . levalbuterol  0.63 mg Nebulization Q6H  . levofloxacin  500 mg Oral QODAY  . lisinopril  20 mg Oral Daily  . oseltamivir  75 mg Oral Daily  . predniSONE  60 mg Oral Q breakfast   Continuous Infusions:   Principal Problem:   COPD exacerbation Active Problems:   Type II or unspecified type diabetes mellitus without mention of complication, not stated as uncontrolled   HYPERTENSION   COPD with emphysema   DYSPNEA   COPD (chronic obstructive pulmonary disease)   Acute on chronic respiratory failure  Time spent: 25  This note has been created with Surveyor, quantity. Any transcriptional errors are unintentional.   Marzetta Board, MD [facility] Pager [phone]. If 7 PM - 7 AM, please contact night-coverage at [email or web address], password [facility] 02/26/2014, 7:54 AM  LOS: 4 days

## 2014-02-27 LAB — GLUCOSE, CAPILLARY
GLUCOSE-CAPILLARY: 229 mg/dL — AB (ref 70–99)
Glucose-Capillary: 90 mg/dL (ref 70–99)
Glucose-Capillary: 105 mg/dL — ABNORMAL HIGH (ref 70–99)
Glucose-Capillary: 204 mg/dL — ABNORMAL HIGH (ref 70–99)

## 2014-02-27 LAB — POTASSIUM: Potassium: 3.9 mEq/L (ref 3.7–5.3)

## 2014-02-27 NOTE — Progress Notes (Addendum)
CSW received return phone call from pt daughter, Ellin.  CSW discussed SNF bed offers.  Pt daughter chooses [facility].  CSW contacted [facility] who confirmed that they had private room availability.  [facility] has initiated insurance authorization through [employer].  Pt requires insurance authorization prior to being discharged from [place] plans to update this CSW regarding insurance authorization.  CSW to continue to follow and facilitate pt discharge needs when insurance authorization received.  Addendum 4:27 pm:  CSW followed up with [facility]. Facility has initiated [employer], but authorization not yet received. Facility anticipates that insurance authorization will be received tomorrow in order for pt to d/c to SNF.  CSW to facilitate pt discharge needs when insurance authorization received.  Alison Murray, MSW, [place] Work [phone]

## 2014-02-27 NOTE — Progress Notes (Signed)
02/27/14 0804  OK to keep IV's for today per MD. Patient due to be discharged today if SNF placement is found.

## 2014-02-27 NOTE — Progress Notes (Signed)
Physical Therapy Treatment Patient Details Name: Jennifer Duran MRN: [id number] DOB: [birth year] Today's Date: 02/27/2014 Time: 1210-1225 PT Time Calculation (min): 15 min  PT Assessment / Plan / Recommendation  History of Present Illness 80 y.o. female admitted from ALF with COPD exacerbation, flu.    PT Comments   **Pt tolerated increased activity today. She walked 25' with 3L O2, SaO2 90% HR 112 with walking. Distance limited by SOB. REady to DC to SNF from PT standpoint.*  Follow Up Recommendations  SNF (assist needed for mobility)     Does the patient have the potential to tolerate intense rehabilitation     Barriers to Discharge        Equipment Recommendations  None recommended by PT    Recommendations for Other Services    Frequency Min 3X/week   Progress towards PT Goals Progress towards PT goals: Progressing toward goals  Plan Current plan remains appropriate    Precautions / Restrictions Precautions Precautions: Fall Precaution Comments: monitor O2 Restrictions Weight Bearing Restrictions: No   Pertinent Vitals/Pain *0/10 pain SaO2 90% on 3L walking HR 112 walking**    Mobility  Bed Mobility Overal bed mobility: Needs Assistance Bed Mobility: Supine to Sit Supine to sit: Min assist General bed mobility comments: assist to pivot hips to EOB Transfers Overall transfer level: Needs assistance Equipment used: Rolling walker (2 wheeled) Transfers: Sit to/from Stand Sit to Stand: Min assist General transfer comment: assist to rise Ambulation/Gait Ambulation Distance (Feet): 25 Feet Assistive device: Rolling walker (2 wheeled) Gait Pattern/deviations: Trunk flexed;Step-through pattern General Gait Details: distance limited by fatigue, SOB, pt walked with 3L O2, SaO2 90%, HR 112 with walking    Exercises     PT Diagnosis:    PT Problem List:   PT Treatment Interventions:     PT Goals (current goals can now be found in the care plan section) Acute  Rehab PT Goals Patient Stated Goal: to walk independently PT Goal Formulation: With patient Time For Goal Achievement: 03/12/14 Potential to Achieve Goals: Fair  Visit Information  Last PT Received On: 02/27/14 Assistance Needed: +1 History of Present Illness: 80 y.o. female admitted from ALF with COPD exacerbation, flu.     Subjective Data  Patient Stated Goal: to walk independently   Cognition  Cognition Arousal/Alertness: Awake/alert Behavior During Therapy: WFL for tasks assessed/performed Overall Cognitive Status: Within Functional Limits for tasks assessed    Balance  Balance Overall balance assessment: Needs assistance Sitting-balance support: Bilateral upper extremity supported;Feet supported Sitting balance-Leahy Scale: Fair Standing balance-Leahy Scale: Poor  End of Session PT - End of Session Equipment Utilized During Treatment: Gait belt;Oxygen Activity Tolerance: Patient limited by fatigue Patient left: in chair;with call bell/phone within reach Nurse Communication: Mobility status   GP     Jennifer Duran 02/27/2014, 12:39 PM [phone]

## 2014-02-27 NOTE — Progress Notes (Signed)
PROGRESS NOTE  Jennifer Duran OEV:[id number] DOB: Jul 08, 1929 DOA: 02/22/2014 PCP: Hollace Kinnier, DO  Assessment/Plan:  COPD exacerbation  - flu positive, continue Tamiflu for a total of 5 days, today day 3/5 - continue Levofloxacin, 1 more dose. - Pulm evaluated patient while hospitalized.  - still very weak and deconditioned, PT recommending SNF. Awaiting authorization Influenza - Tamiflu.  HTN - continue lisinopril to 20 mg daily DM - continue home medicatios  Anxiety - hold clonazepam for now, low dose short acting xanax prn Depression - continue Celexa  Diet: carb modified Fluids: none DVT Prophylaxis: Lovenox  Code Status: Partial Family Communication: none Disposition Plan: inpatient  Consultants:  Pulmonology   Procedures:  none   Antibiotics  Anti-infectives   Start     Dose/Rate Route Frequency Ordered Stop   02/24/14 1600  levofloxacin (LEVAQUIN) IVPB 750 mg  Status:  Discontinued     750 mg 100 mL/hr over 90 Minutes Intravenous Every 48 hours 02/22/14 1728 02/24/14 0928   02/24/14 1030  levofloxacin (LEVAQUIN) tablet 500 mg     500 mg Oral Every other day 02/24/14 0928 03/02/14 0959   02/24/14 1000  oseltamivir (TAMIFLU) capsule 75 mg     75 mg Oral Daily 02/24/14 0801 03/01/14 0959   02/22/14 1645  levofloxacin (LEVAQUIN) IVPB 750 mg     750 mg 100 mL/hr over 90 Minutes Intravenous  Once 02/22/14 1624 02/22/14 1809     Antibiotics Given (last 72 hours)   Date/Time Action Medication Dose   02/25/14 1020 Given   oseltamivir (TAMIFLU) capsule 75 mg 75 mg   02/26/14 1100 Given   oseltamivir (TAMIFLU) capsule 75 mg 75 mg   02/26/14 1100 Given   levofloxacin (LEVAQUIN) tablet 500 mg 500 mg   02/27/14 0854 Given   oseltamivir (TAMIFLU) capsule 75 mg 75 mg      HPI/Subjective: - no breathing difficulties  Objective: Filed Vitals:   02/27/14 0853 02/27/14 1233 02/27/14 1432 02/27/14 1443  BP: 119/51   154/60  Pulse: 92 112  90  Temp:     97.9 F (36.6 C)  TempSrc:    Oral  Resp:    16  Height:      Weight:      SpO2:  90% 93% 93%    Intake/Output Summary (Last 24 hours) at 02/27/14 1622 Last data filed at 02/27/14 1300  Gross per 24 hour  Intake    480 ml  Output   1650 ml  Net  -1170 ml   Filed Weights   02/22/14 1732 02/24/14 0100 02/25/14 0000  Weight: 51.9 kg (114 lb 6.7 oz) 50.5 kg (111 lb 5.3 oz) 50 kg (110 lb 3.7 oz)   Exam:  General:  NAD  Cardiovascular: regular rate and rhythm, without MRG  Respiratory: good air movement, clear to auscultation throughout, no wheezing, ronchi or rales; distant breath sounds.   Abdomen: soft, not tender to palpation, positive bowel sounds  MSK: no peripheral edema  Neuro: CN 2-12 grossly intact, MS 5/5 in all 4  Data Reviewed: Basic Metabolic Panel:  Recent Labs Lab 02/22/14 0957 02/23/14 0320 02/24/14 0300 02/25/14 0400 02/27/14 0755  NA 140 135* 140 142  --   K 3.8 4.4 3.5* 3.5* 3.9  CL 102 96 99 101  --   CO2  --  _0 --   GLUCOSE 132* 190* 133* 166*  --   BUN 15 20 26* 26*  --   CREATININE  1.00 0.82 0.74 0.70  --   CALCIUM  --  9.4 8.9 8.6  --   MG  --  2.3  --   --   --   PHOS  --  4.4  --   --   --    Liver Function Tests:  Recent Labs Lab 02/23/14 0320  AST 28  ALT 8  ALKPHOS 60  BILITOT 0.4  PROT 8.1  ALBUMIN 3.4*   CBC:  Recent Labs Lab 02/22/14 0950 02/22/14 0957 02/23/14 0320 02/24/14 0300  WBC 9.2  --  4.8 9.6  NEUTROABS 5.3  --   --   --   HGB 12.8 15.0 13.5 12.2  HCT 40.6 44.0 42.8 37.7  MCV 86.0  --  86.1 84.0  PLT 324  --  396 357   Cardiac Enzymes:  Recent Labs Lab 02/22/14 1215 02/22/14 1805 02/22/14 2330  TROPONINI <0.30 <0.30 <0.30   BNP (last 3 results)  Recent Labs  02/23/14 1555  PROBNP 2893.0*   CBG:  Recent Labs Lab 02/26/14 1204 02/26/14 1713 02/26/14 2136 02/27/14 0717 02/27/14 1322  GLUCAP 132* 176* 184* 90 105*    Recent Results (from the past 240 hour(s))  MRSA  PCR SCREENING     Status: None   Collection Time    02/22/14  6:01 PM      Result Value Ref Range Status   MRSA by PCR NEGATIVE  NEGATIVE Final   Comment:            The GeneXpert MRSA Assay (FDA     approved for NASAL specimens     only), is one component of a     comprehensive MRSA colonization     surveillance program. It is not     intended to diagnose MRSA     infection nor to guide or     monitor treatment for     MRSA infections.  RESPIRATORY VIRUS PANEL     Status: Abnormal   Collection Time    02/23/14  5:40 PM      Result Value Ref Range Status   Source - RVPAN Not Provided   Final   Respiratory Syncytial Virus A NOT DETECTED   Final   Respiratory Syncytial Virus B NOT DETECTED   Final   Influenza A DETECTED (*)  Final   Influenza B NOT DETECTED   Final   Parainfluenza 1 NOT DETECTED   Final   Parainfluenza 2 NOT DETECTED   Final   Parainfluenza 3 NOT DETECTED   Final   Metapneumovirus NOT DETECTED   Final   Rhinovirus NOT DETECTED   Final   Adenovirus NOT DETECTED   Final   Influenza A H1 NOT DETECTED   Final   Influenza A H3 DETECTED (*)  Final   Comment: (NOTE)           Normal Reference Range for each Analyte: NOT DETECTED     Testing performed using the Luminex xTAG Respiratory Viral Panel test     kit.     This test was developed and its performance characteristics determined     by [facility]. It has not been cleared or approved by the [place]     Food and Drug Administration. This test is used for clinical purposes.     It should not be regarded as investigational or for research. This     laboratory is certified under the Clinical Laboratory Improvement  Amendments of 1988 (CLIA) as qualified to perform high complexity     clinical laboratory testing.     Performed at [facility]   Scheduled Meds: . budesonide  0.25 mg Nebulization BID  . citalopram  20 mg Oral Daily  . clopidogrel  75 mg Oral Q breakfast  . enoxaparin (LOVENOX)  injection  40 mg Subcutaneous Q24H  . ferrous sulfate  325 mg Oral Daily  . furosemide  40 mg Oral Daily  . insulin aspart  0-5 Units Subcutaneous QHS  . insulin aspart  0-9 Units Subcutaneous TID WC  . insulin glargine  8 Units Subcutaneous QHS  . ipratropium  0.5 mg Nebulization Q6H  . levalbuterol  0.63 mg Nebulization Q6H  . levofloxacin  500 mg Oral QODAY  . lisinopril  20 mg Oral Daily  . oseltamivir  75 mg Oral Daily  . predniSONE  60 mg Oral Q breakfast   Continuous Infusions:   Principal Problem:   COPD exacerbation Active Problems:   Type II or unspecified type diabetes mellitus without mention of complication, not stated as uncontrolled   HYPERTENSION   COPD with emphysema   DYSPNEA   COPD (chronic obstructive pulmonary disease)   Acute on chronic respiratory failure  Time spent: 25  This note has been created with Surveyor, quantity. Any transcriptional errors are unintentional.   Marzetta Board, MD [facility] Pager [phone]. If 7 PM - 7 AM, please contact night-coverage at [email or web address], password [facility] 02/27/2014, 4:22 PM  LOS: 5 days

## 2014-02-27 NOTE — Progress Notes (Addendum)
CSW received notification from RN that pt medically ready for discharge.  CSW discussed with pt at bedside, but pt deferring decision to pt daughter.  CSW contacted pt daughter via telephone and left message in order to discuss bed offers.  Pt has an insurance that requires authorization prior to pt transition to SNF.  CSW will keep MD updated.  CSW to continue to follow.  Alison Murray, MSW, [place] [employer] [phone]

## 2014-02-28 ENCOUNTER — Encounter: Payer: Self-pay | Admitting: Family

## 2014-02-28 LAB — GLUCOSE, CAPILLARY
GLUCOSE-CAPILLARY: 196 mg/dL — AB (ref 70–99)
Glucose-Capillary: 74 mg/dL (ref 70–99)

## 2014-02-28 MED ORDER — PREDNISONE 20 MG PO TABS: 60.0000 mg | ORAL_TABLET | Freq: Every day | ORAL | Status: DC

## 2014-02-28 MED ORDER — CLONAZEPAM 0.5 MG PO TABS: 0.2500 mg | ORAL_TABLET | Freq: Two times a day (BID) | ORAL | Status: DC | PRN

## 2014-02-28 MED ORDER — TRAMADOL HCL 50 MG PO TABS: ORAL_TABLET | ORAL | Status: DC

## 2014-02-28 MED ORDER — LISINOPRIL 10 MG PO TABS: 20.0000 mg | ORAL_TABLET | Freq: Every day | ORAL | Status: DC

## 2014-02-28 NOTE — Discharge Summary (Signed)
Physician Discharge Summary  Jennifer Duran UDT:[id number] DOB: Mar 20, 1929 DOA: 02/22/2014  PCP: Hollace Kinnier, DO  Admit date: 02/22/2014 Discharge date: 02/28/2014  Time spent: 35 minutes  Recommendations for Outpatient Follow-up:  1. Follow up with PCP in 1 week for blood pressure check 2. Follow up with Dr. Annamaria Boots as needed   Discharge Diagnoses:  Principal Problem:   COPD exacerbation Active Problems:   Type II or unspecified type diabetes mellitus without mention of complication, not stated as uncontrolled   HYPERTENSION   COPD with emphysema   DYSPNEA   COPD (chronic obstructive pulmonary disease)   Acute on chronic respiratory failure  Discharge Condition: stable  Diet recommendation: heart healthy  Filed Weights   02/22/14 1732 02/24/14 0100 02/25/14 0000  Weight: 51.9 kg (114 lb 6.7 oz) 50.5 kg (111 lb 5.3 oz) 50 kg (110 lb 3.7 oz)   History of present illness:  Jennifer Duran is a 80 y.o. female has a past medical history significant for COPD, DM, HTN and as per below presents to the ED with a chief complaint of shortness of breath. She reports that in the past 12-24 hours she has been having worsening breathing difficulties, more productive cough and low grade temperature at home. Denies chest pain, no nausea / vomiting / diarrhea, no lightheadedness or dizziness. For her COPD she is being followed by Dr. Annamaria Boots and she is on continuous oxygen. In the ED, initially severely short of breath and PCCM consult was contemplated by EDP however improved significantly after breathing treatments and [facility] asked to admit.   Hospital Course:  COPD exacerbation  - flu positive, continue Tamiflu for a total of 5 days and she finished a course in the hospital - she was also empirically treated with Levofloxacin for 5 days.  - Pulmonology evaluated patient while hospitalized.  - still very weak and deconditioned, to be discharged to SNF - she will have a prednisone taper on  discharge Influenza - s/p a course of Tamiflu.  HTN - persistently hypertensive, her Lisinopril increased to 20 mg daily. Recommend continuing monitoring of her blood pressure and further medication adjustments as clinically indicated.  DM - continue home medicatios  Anxiety - resume previous medications.  Depression - continue Celexa  Procedures:  2D echo Study Conclusions  - Left ventricle: The cavity size was normal. Wall thickness was normal. Systolic function was normal. The estimated ejection fraction was in the range of 55% to 60%. Wall motion was normal; there were no regional wall motion abnormalities. Doppler parameters are consistent with abnormal left ventricular relaxation (grade 1 diastolic dysfunction). Doppler parameters are consistent with high ventricular filling pressure. - Aortic valve: Mild regurgitation. - Mitral valve: Calcified annulus. Mild regurgitation. - Left atrium: The atrium was mildly dilated. - Pulmonary arteries: Systolic pressure was mildly to moderately increased.  Consultations:  Pulmonology   Discharge Exam: Filed Vitals:   02/27/14 1950 02/27/14 1951 02/27/14 2135 02/28/14 0624  BP:   165/63 158/62  Pulse: 92  89 80  Temp:   98.2 F (36.8 C) 98.2 F (36.8 C)  TempSrc:   Oral Oral  Resp: _0 Height:      Weight:      SpO2: 94% 94% 94% 99%   General: NAD Cardiovascular: RRR Respiratory: CTA biL  Discharge Instructions       Future Appointments Provider Department Dept Phone   03/01/2014 1:30 PM [facility] [phone]  03/01/2014 2:20 PM Sharmon Leyden Nickel, NP [facility] [phone]   03/21/2014 1:30 PM Deneise Lever, MD [facility] [phone]   04/12/2014 1:45 PM Hayden Pedro, MD [place] [phone]       Medication List         albuterol 108 (90 BASE) MCG/ACT inhaler  Commonly known as:  PROVENTIL  HFA;VENTOLIN HFA  Inhale 2 puffs into the lungs every 4 (four) hours as needed for wheezing or shortness of breath.     citalopram 40 MG tablet  Commonly known as:  CELEXA  Take one tablet by mouth once daily     clonazePAM 0.5 MG tablet  Commonly known as:  KLONOPIN  Take 0.5 tablets (0.25 mg total) by mouth 2 (two) times daily as needed for anxiety. Take 1 tablet each morning and 2 tablets at bedtime     clopidogrel 75 MG tablet  Commonly known as:  PLAVIX  Take 1 tablet (75 mg total) by mouth daily.     ferrous sulfate 325 (65 FE) MG tablet  Take 325 mg by mouth daily.     Fluticasone-Salmeterol 250-50 MCG/DOSE Aepb  Commonly known as:  ADVAIR  Inhale 1 puff into the lungs 2 (two) times daily.     furosemide 40 MG tablet  Commonly known as:  LASIX  Take 40 mg by mouth daily.     guaiFENesin 600 MG 12 hr tablet  Commonly known as:  MUCINEX  Take 1,200 mg by mouth 2 (two) times daily.     insulin aspart 100 UNIT/ML injection  Commonly known as:  novoLOG  Inject 5 Units into the skin 3 (three) times daily before meals.     insulin glargine 100 UNIT/ML injection  Commonly known as:  LANTUS  Inject 10 Units into the skin at bedtime.     Insulin Pen Needle 31G X 8 MM Misc  Check blood sugar four times daily. DX: 250.00 and 401.9     lisinopril 10 MG tablet  Commonly known as:  PRINIVIL,ZESTRIL  Take 2 tablets (20 mg total) by mouth daily.     ondansetron 4 MG disintegrating tablet  Commonly known as:  ZOFRAN ODT  Take 1 tablet (4 mg total) by mouth every 8 (eight) hours as needed for nausea.     predniSONE 20 MG tablet  Commonly known as:  DELTASONE  Take 3 tablets (60 mg total) by mouth daily with breakfast. 2 tablets daily for 3 days then 1 tablet daily for 3 days then 1/2 tablet daily for 4 days then stop.     promethazine 12.5 MG tablet  Commonly known as:  PHENERGAN  Take 12.5 mg by mouth every 12 (twelve) hours as needed for nausea or vomiting.      tiotropium 18 MCG inhalation capsule  Commonly known as:  SPIRIVA HANDIHALER  Place 1 capsule (18 mcg total) into inhaler and inhale daily.     traMADol 50 MG tablet  Commonly known as:  ULTRAM  Take two tablets by mouth at bedtime       Follow-up Information   Follow up with REED, TIFFANY, DO. Schedule an appointment as soon as possible for a visit in 1 week.   Specialty:  Geriatric Medicine   Contact information:   [address] [place] [address] [phone]       Follow up with Deneise Lever, MD. Schedule an appointment as soon as possible for a  visit in 1 week.   Specialty:  Pulmonary Disease   Contact information:   [address]  [facility] [place] [address] [phone]       The results of significant diagnostics from this hospitalization (including imaging, microbiology, ancillary and laboratory) are listed below for reference.    Significant Diagnostic Studies: Dg Chest Port 1 View  02/24/2014   CLINICAL DATA:  Dyspnea  EXAM: PORTABLE CHEST - 1 VIEW  COMPARISON:  DG CHEST 1V PORT dated 02/23/2014; DG CHEST 1V PORT dated 02/22/2014; DG CHEST 2 VIEW dated 11/18/2013; CT CHEST W/O CM dated 12/08/2013  FINDINGS: Grossly unchanged cardiac silhouette and mediastinal contours with mild tortuosity of the thoracic aorta. The lungs are hyperexpanded with flattening of bilateral diaphragms and mild diffuse slightly nodular thickening of the pulmonary interstitium. There is asymmetric thinning of the pulmonary parenchyma within the bilateral lung apices, right greater than left. Improved aeration of the left lower lung. No new focal airspace opacities. No pleural effusion or pneumothorax. No definite evidence of edema. Grossly unchanged bones.  IMPRESSION: 1. Resolved left basilar atelectasis. 2. Marked lung hyperexpansion without definite acute cardiopulmonary disease on this AP portable examination.   Electronically Signed   By: Sandi Mariscal M.D.   On:  02/24/2014 07:22   Dg Chest Port 1 View  02/23/2014   CLINICAL DATA:  Acute hypoxemia  EXAM: PORTABLE CHEST - 1 VIEW  COMPARISON:  Prior chest x-ray 02/22/2014  FINDINGS: Stable appearance of the lungs with pulmonary hyper expansion and advanced emphysema with bullous change in the right greater than left upper lungs. No pneumothorax. Slightly increased linear opacity in the left lung base. Overall, the degree of peribronchial thickening and interstitial prominence is slightly progressed compared to 11/18/2013.  Cardiac and mediastinal contours remain unchanged. There is atherosclerotic calcification in the transverse aorta.  IMPRESSION: Slight interval progression of the diffuse bronchial wall thickening and interstitial prominence with a developing linear opacity in the left lung base. Differential considerations include mild interstitial edema with developing left basilar atelectasis, COPD exacerbation with developing left basilar atelectasis, and acute atypical infectious process with developing left basilar atelectasis versus infiltrate.   Electronically Signed   By: Jacqulynn Cadet M.D.   On: 02/23/2014 07:45   Dg Chest Port 1 View  02/22/2014   CLINICAL DATA:  sob  EXAM: PORTABLE CHEST - 1 VIEW  COMPARISON:  CT CHEST W/O CM dated 12/08/2013; DG CHEST 2 VIEW dated 11/18/2013  FINDINGS: Lungs are hyperinflated. There is diffuse prominence of the interstitial markings. Previous described area increased density within the left upper hemi thorax has slightly increased in conspicuity low visualization is partially obscured secondary to an overlying cardiac monitoring lead. No focal regions of consolidation appreciated. Cardiac silhouette is within normal limits. Atherosclerotic calcifications identified within the aorta the osseous structures are unremarkable. There is lucency within the lung apices.  IMPRESSION: Emphysematous changes within the lungs as well as a underlying component of fibrotic changes.  No focal regions of consolidation. The ill-defined area of increased density within the upper left hemi thorax is slightly more conspicuous when compared to the previous study.   Electronically Signed   By: Margaree Mackintosh M.D.   On: 02/22/2014 10:29    Microbiology: Recent Results (from the past 240 hour(s))  MRSA PCR SCREENING     Status: None   Collection Time    02/22/14  6:01 PM      Result Value Ref Range Status   MRSA  by PCR NEGATIVE  NEGATIVE Final   Comment:            The GeneXpert MRSA Assay (FDA     approved for NASAL specimens     only), is one component of a     comprehensive MRSA colonization     surveillance program. It is not     intended to diagnose MRSA     infection nor to guide or     monitor treatment for     MRSA infections.  RESPIRATORY VIRUS PANEL     Status: Abnormal   Collection Time    02/23/14  5:40 PM      Result Value Ref Range Status   Source - RVPAN Not Provided   Final   Respiratory Syncytial Virus A NOT DETECTED   Final   Respiratory Syncytial Virus B NOT DETECTED   Final   Influenza A DETECTED (*)  Final   Influenza B NOT DETECTED   Final   Parainfluenza 1 NOT DETECTED   Final   Parainfluenza 2 NOT DETECTED   Final   Parainfluenza 3 NOT DETECTED   Final   Metapneumovirus NOT DETECTED   Final   Rhinovirus NOT DETECTED   Final   Adenovirus NOT DETECTED   Final   Influenza A H1 NOT DETECTED   Final   Influenza A H3 DETECTED (*)  Final   Comment: (NOTE)           Normal Reference Range for each Analyte: NOT DETECTED     Testing performed using the Luminex xTAG Respiratory Viral Panel test     kit.     This test was developed and its performance characteristics determined     by [facility]. It has not been cleared or approved by the [place]     Food and Drug Administration. This test is used for clinical purposes.     It should not be regarded as investigational or for research. This     laboratory is certified under the [place] (CLIA) as qualified to perform high complexity     clinical laboratory testing.     Performed at [facility]: Basic Metabolic Panel:  Recent Labs Lab 02/22/14 0957 02/23/14 0320 02/24/14 0300 02/25/14 0400 02/27/14 0755  NA 140 135* 140 142  --   K 3.8 4.4 3.5* 3.5* 3.9  CL 102 96 99 101  --   CO2  --  _0 --   GLUCOSE 132* 190* 133* 166*  --   BUN 15 20 26* 26*  --   CREATININE 1.00 0.82 0.74 0.70  --   CALCIUM  --  9.4 8.9 8.6  --   MG  --  2.3  --   --   --   PHOS  --  4.4  --   --   --    Liver Function Tests:  Recent Labs Lab 02/23/14 0320  AST 28  ALT 8  ALKPHOS 60  BILITOT 0.4  PROT 8.1  ALBUMIN 3.4*   CBC:  Recent Labs Lab 02/22/14 0950 02/22/14 0957 02/23/14 0320 02/24/14 0300  WBC 9.2  --  4.8 9.6  NEUTROABS 5.3  --   --   --   HGB 12.8 15.0 13.5 12.2  HCT 40.6 44.0 42.8 37.7  MCV 86.0  --  86.1 84.0  PLT 324  --  396 357   Cardiac Enzymes:  Recent Labs Lab 02/22/14 1215 02/22/14 1805 02/22/14 2330  TROPONINI <0.30 <0.30 <0.30   BNP: BNP (last 3 results)  Recent Labs  02/23/14 1555  PROBNP 2893.0*   CBG:  Recent Labs Lab 02/27/14 0717 02/27/14 1322 02/27/14 1646 02/27/14 2142 02/28/14 0737  GLUCAP 90 105* 204* 229* 74    Signed:  ,   [facility] 02/28/2014, 8:19 AM

## 2014-02-28 NOTE — Progress Notes (Signed)
Report called to Tish at [facility].

## 2014-02-28 NOTE — Progress Notes (Signed)
CSW received notification from [facility] that facility received insurance authorization for pt admission to SNF.  CSW facilitated pt discharge needs including contacting facility, faxing pt discharge information via [facility], discussing with pt at bedside and pt daughter via telephone, providing RN phone number to call report, and arranging ambulance transport for pt to [place] at 1:30 pm. (Service Request ID#: [id number]).  No further social work needs identified at this time.   CSW signing off.   Alison Murray, MSW, [place] Work [phone]

## 2014-02-28 NOTE — Progress Notes (Signed)
Pt discharged to [facility] with [facility] in stable condition. Report called to facility.

## 2014-03-01 ENCOUNTER — Non-Acute Institutional Stay (SKILLED_NURSING_FACILITY): Payer: PRIVATE HEALTH INSURANCE | Admitting: Internal Medicine

## 2014-03-01 ENCOUNTER — Encounter (HOSPITAL_COMMUNITY): Payer: Medicare Other

## 2014-03-01 ENCOUNTER — Ambulatory Visit: Payer: Medicare Other | Admitting: Family

## 2014-03-01 ENCOUNTER — Other Ambulatory Visit: Payer: Self-pay | Admitting: *Deleted

## 2014-03-01 DIAGNOSIS — J449 Chronic obstructive pulmonary disease, unspecified: Secondary | ICD-10-CM

## 2014-03-01 DIAGNOSIS — J9620 Acute and chronic respiratory failure, unspecified whether with hypoxia or hypercapnia: Secondary | ICD-10-CM

## 2014-03-01 DIAGNOSIS — F3289 Other specified depressive episodes: Secondary | ICD-10-CM

## 2014-03-01 DIAGNOSIS — J4489 Other specified chronic obstructive pulmonary disease: Secondary | ICD-10-CM

## 2014-03-01 DIAGNOSIS — E1149 Type 2 diabetes mellitus with other diabetic neurological complication: Secondary | ICD-10-CM

## 2014-03-01 DIAGNOSIS — F32A Depression, unspecified: Secondary | ICD-10-CM

## 2014-03-01 DIAGNOSIS — F329 Major depressive disorder, single episode, unspecified: Secondary | ICD-10-CM

## 2014-03-01 DIAGNOSIS — J09X2 Influenza due to identified novel influenza A virus with other respiratory manifestations: Secondary | ICD-10-CM

## 2014-03-01 DIAGNOSIS — J189 Pneumonia, unspecified organism: Secondary | ICD-10-CM

## 2014-03-01 MED ORDER — CLONAZEPAM 0.5 MG PO TABS: ORAL_TABLET | ORAL | Status: DC

## 2014-03-01 NOTE — Telephone Encounter (Signed)
[facility] [place] 

## 2014-03-01 NOTE — Progress Notes (Signed)
Patient ID: Jennifer Duran, female   DOB: 1929-09-02, 80 y.o.   MRN: [id number]  Provider:  Rexene Edison. Mariea Clonts, D.O., C.M.D.  Location:  [facility]  PCP: Hollace Kinnier, DO  Code Status: FULL  Allergies  Allergen Reactions  . Celecoxib Swelling  . Codeine Nausea And Vomiting  . Metronidazole Other (See Comments)    neuropathy  . Pregabalin Swelling  . Propranolol Hcl Other (See Comments)    Unknown  . Rofecoxib Other (See Comments)    Unknown  . Iodine Rash    Only if ingested  . Pregabalin Hives and Rash    Chief Complaint  Patient presents with  . Hospitalization Follow-up    New admit s/p hospitalization for COPD exacerbation    HPI: 80 y.o. female with a history of COPD with emphysema, DM II, dyspnea, and acute on chronic respiratory failure who is being admitted today for SNF s/p hospitalization for COPD exacerbation.  She was followed by me as an outpatient at [facility].  Admitted at [facility] from 2/25-02/28/14 with complaints of shortness of breath, cough, and low grade fever.  Positive for flu, completed treatment of Tamiflu and Levofloxacin x5 days. Pulmonary consulted.  Prednisone taper initiated.  Persistently hypertensive during admission, Lisinopril increased to 20mg  daily. An echocardiogram was done during her stay.    She is starting to feel gradually better, but remains quite weak and requiring more help with her bathing and dressing.  Is persistently dyspneic, but this is not new for her with her end stage COPD.    ROS: Review of Systems  Constitutional: Positive for weight loss and malaise/fatigue. Negative for fever and chills.  Eyes: Negative for blurred vision.  Respiratory: Positive for shortness of breath.   Cardiovascular: Negative for chest pain, palpitations and leg swelling.  Gastrointestinal: Negative for abdominal pain, constipation, blood in stool and melena.  Genitourinary: Negative for dysuria.    Musculoskeletal: Positive for myalgias. Negative for falls.  Skin: Negative for rash.  Neurological: Positive for weakness. Negative for dizziness, loss of consciousness and headaches.  Endo/Heme/Allergies: Bruises/bleeds easily.  Psychiatric/Behavioral: Positive for depression. Negative for memory loss. The patient has insomnia.     Past Medical History  Diagnosis Date  . Allergic rhinitis, cause unspecified   . Unspecified essential hypertension   . Cor pulmonale   . COPD (chronic obstructive pulmonary disease)   . Type II or unspecified type diabetes mellitus without mention of complication, not stated as uncontrolled   . Osteoporosis   . Hashimoto's disease   . Peripheral vascular disease   . Hyperlipidemia   . Depression   . Urinary frequency   . Other atopic dermatitis and related conditions   . Abnormal involuntary movements(781.0)   . Hyposmolality and/or hyponatremia   . Peripheral vascular disease, unspecified   . Lung mass   . Insomnia   . History of radiation therapy 08/08/2013, 08/10/2013, 08/16/2013    SBRT 54 Gy to left upper lobe pulmonary nodule   Past Surgical History  Procedure Laterality Date  . Total abdominal hysterectomy    . Cataracts    . Rotator cuff repair    . Mastectomy Right 1952  . Eye surgery     Social History:   reports that she has quit smoking. She has never used smokeless tobacco. She reports that she does not drink alcohol or use illicit drugs.  Family History  Problem Relation Age of Onset  . Cancer Mother  lung   . Lupus Father   . Heart disease Sister     Medications: Patient's Medications  New Prescriptions   No medications on file  Previous Medications   ALBUTEROL (PROVENTIL HFA;VENTOLIN HFA) 108 (90 BASE) MCG/ACT INHALER    Inhale 2 puffs into the lungs every 4 (four) hours as needed for wheezing or shortness of breath.   CITALOPRAM (CELEXA) 40 MG TABLET    Take one tablet by mouth once daily   CLONAZEPAM (KLONOPIN)  0.5 MG TABLET    Take one tablet by mouth every morning; Take two tablets by mouth at bedtime; Take 1/2 tablet by mouth twice daily as needed   CLOPIDOGREL (PLAVIX) 75 MG TABLET    Take 1 tablet (75 mg total) by mouth daily.   FERROUS SULFATE 325 (65 FE) MG TABLET    Take 325 mg by mouth daily.   FLUTICASONE-SALMETEROL (ADVAIR) 250-50 MCG/DOSE AEPB    Inhale 1 puff into the lungs 2 (two) times daily.   FUROSEMIDE (LASIX) 40 MG TABLET    Take 40 mg by mouth daily.   GUAIFENESIN (MUCINEX) 600 MG 12 HR TABLET    Take 1,200 mg by mouth 2 (two) times daily.   INSULIN ASPART (NOVOLOG) 100 UNIT/ML INJECTION    Inject 5 Units into the skin 3 (three) times daily before meals.    INSULIN GLARGINE (LANTUS) 100 UNIT/ML INJECTION    Inject 10 Units into the skin at bedtime.     INSULIN PEN NEEDLE 31G X 8 MM MISC    Check blood sugar four times daily. DX: 250.00 and 401.9   LISINOPRIL (PRINIVIL,ZESTRIL) 10 MG TABLET    Take 2 tablets (20 mg total) by mouth daily.   ONDANSETRON (ZOFRAN ODT) 4 MG DISINTEGRATING TABLET    Take 1 tablet (4 mg total) by mouth every 8 (eight) hours as needed for nausea.   PREDNISONE (DELTASONE) 20 MG TABLET    Take 3 tablets (60 mg total) by mouth daily with breakfast. 2 tablets daily for 3 days then 1 tablet daily for 3 days then 1/2 tablet daily for 4 days then stop.   PROMETHAZINE (PHENERGAN) 12.5 MG TABLET    Take 12.5 mg by mouth every 12 (twelve) hours as needed for nausea or vomiting.   TIOTROPIUM (SPIRIVA HANDIHALER) 18 MCG INHALATION CAPSULE    Place 1 capsule (18 mcg total) into inhaler and inhale daily.   TRAMADOL (ULTRAM) 50 MG TABLET    Take two tablets by mouth at bedtime  Modified Medications   No medications on file  Discontinued Medications   No medications on file     Physical Exam: There were no vitals filed for this visit. Physical Exam  Vitals reviewed. Constitutional: She is oriented to person, place, and time. No distress.  Increasingly frail white  female, wearing oxygen  HENT:  Head: Normocephalic and atraumatic.  Right Ear: External ear normal.  Left Ear: External ear normal.  Nose: Nose normal.  Mouth/Throat: Oropharynx is clear and moist. No oropharyngeal exudate.  Eyes: EOM are normal. Pupils are equal, round, and reactive to light.  Neck: Normal range of motion. Neck supple. No JVD present. No thyromegaly present.  Cardiovascular: Normal rate, regular rhythm, normal heart sounds and intact distal pulses.   Pulmonary/Chest: No respiratory distress. She has wheezes. She exhibits no tenderness.  Some abdominal breathing (not new)  Abdominal: Soft. Bowel sounds are normal. She exhibits no distension and no mass. There is no tenderness.  Musculoskeletal: Normal  range of motion. She exhibits no edema and no tenderness.  Neurological: She is alert and oriented to person, place, and time. No cranial nerve deficit.  Skin: Skin is warm and dry.  Psychiatric: She has a normal mood and affect. Her behavior is normal. Judgment and thought content normal.     Labs reviewed: Basic Metabolic Panel:  Recent Labs  11/02/13 1545 11/02/13 1620  02/22/14 0957 02/23/14 0320 02/24/14 0300 02/25/14 0400 02/27/14 0755  NA 142  --   < > 140 135* 140 142  --   K 4.0  --   < > 3.8 4.4 3.5* 3.5* 3.9  CL 100  --   < > 102 96 99 101  --   CO2 28  --   < >  --  23 27 29   --   GLUCOSE 98  --   < > 132* 190* 133* 166*  --   BUN 15  --   < > 15 20 26* 26*  --   CREATININE 0.90  --   < > 1.00 0.82 0.74 0.70  --   CALCIUM 10.2  --   < >  --  9.4 8.9 8.6  --   MG  --  2.1  --   --  2.3  --   --   --   PHOS  --   --   --   --  4.4  --   --   --   < > = values in this interval not displayed. Liver Function Tests:  Recent Labs  05/19/13 1420  11/02/13 1545 12/01/13 1508 02/23/14 0320  AST 22  < > 21 18 28   ALT 8  < > 8 6 8   ALKPHOS 55  < > 63 66 60  BILITOT 0.4  < > 0.6 0.2 0.4  PROT 6.8  < > 7.0 6.1 8.1  ALBUMIN 3.6  --  3.8  --  3.4*  <  > = values in this interval not displayed. No results found for this basename: LIPASE, AMYLASE,  in the last 8760 hours No results found for this basename: AMMONIA,  in the last 8760 hours CBC:  Recent Labs  11/02/13 1545 12/01/13 1508 02/22/14 0950 02/22/14 0957 02/23/14 0320 02/24/14 0300  WBC 8.3 8.0 9.2  --  4.8 9.6  NEUTROABS 6.4 5.8 5.3  --   --   --   HGB 12.8 12.4 12.8 15.0 13.5 12.2  HCT 40.7 37.7 40.6 44.0 42.8 37.7  MCV 87.2 84 86.0  --  86.1 84.0  PLT 314  --  324  --  396 357   Cardiac Enzymes:  Recent Labs  05/19/13 1420 11/02/13 1620 02/22/14 1215 02/22/14 1805 02/22/14 2330  CKTOTAL  --  130  --   --   --   TROPONINI <0.30  --  <0.30 <0.30 <0.30   BNP: No components found with this basename: POCBNP,  CBG:  Recent Labs  02/27/14 2142 02/28/14 0737 02/28/14 1150  GLUCAP 229* 74 196*    Imaging and Procedures: 02/24/2014 2D echo: - Left ventricle: The cavity size was normal. Wall thickness was normal. Systolic function was normal. The estimated ejection fraction was in the range of 55% to 60%. Wall motion was normal; there were no regional wall motion abnormalities. Doppler parameters are consistent with abnormal left ventricular relaxation (grade 1 diastolic dysfunction). Doppler parameters are consistent with high ventricular filling pressure. - Aortic valve: Mild regurgitation. - Mitral  valve: Calcified annulus. Mild regurgitation. - Left atrium: The atrium was mildly dilated. - Pulmonary arteries: Systolic pressure was mildly to moderately increased.  02/24/2014 CLINICAL DATA: Dyspnea EXAM: PORTABLE CHEST - IMPRESSION: 1. Resolved left basilar atelectasis. 2. Marked lung hyperexpansion without definite acute cardiopulmonary disease on this AP portable examination. Electronically Signed By: Sandi Mariscal M.D. On: 02/24/2014 07:22  Dg Chest Port 1 View   02/23/2014 CLINICAL DATA: Acute hypoxemia EXAM: PORTABLE CHEST - IMPRESSION: Slight interval  progression of the diffuse bronchial wall thickening and interstitial prominence with a developing linear opacity in the left lung base. Differential considerations include mild interstitial edema with developing left basilar atelectasis, COPD exacerbation with developing left basilar atelectasis, and acute atypical infectious process with developing left basilar atelectasis versus infiltrate. Electronically Signed By: Jacqulynn Cadet M.D. On: 02/23/2014 07:45  Dg Chest Port 1 View   02/22/2014 CLINICAL DATA: sob EXAM: PORTABLE CHEST -  IMPRESSION: Emphysematous changes within the lungs as well as a underlying component of fibrotic changes. No focal regions of consolidation. The ill-defined area of increased density within the upper left hemi thorax is slightly more conspicuous when compared to the previous study. Electronically Signed By: Margaree Mackintosh M.D. On: 02/22/2014 10:29  Assessment/Plan 1. Influenza due to identified novel influenza A virus with other respiratory manifestations -completed tamiflu -has severe generalized weakness after hospitalization so here for therapy and may possibly return to [place]   2. CAP (community acquired pneumonia) -completed levaquin and completing prednisone taper   3. COPD (chronic obstructive pulmonary disease) -cont spiriva, albuterol, nebs as needed -f/u with Dr. Annamaria Boots in 1 wk  4. Acute-on-chronic respiratory failure Due to 1, 2, 3 -not quite back to baseline--seems she has declined even further with her end stage copd since she had the flu (had been vaccinated)  5. Type II or unspecified type diabetes mellitus with neurological manifestations, not stated as uncontrolled -under good control with insulin regimen--no changes needed as long as po intake satisfactory--monitor for lows  6. Depression -doing better recently with celexa  Functional status:  Now requiring adl assistance with bathing, dressing due to severe weakness  Family/ staff  Communication: pt is her own decision maker, discussed with her nurse  Note:  Allergies ordered to be added to her snf chart  Labs/tests ordered:  F/u with Dr. Annamaria Boots 3/24

## 2014-03-15 ENCOUNTER — Other Ambulatory Visit: Payer: Self-pay | Admitting: *Deleted

## 2014-03-15 MED ORDER — TRAMADOL HCL 50 MG PO TABS: ORAL_TABLET | ORAL | Status: DC

## 2014-03-15 NOTE — Telephone Encounter (Signed)
[facility] [place] 

## 2014-03-16 ENCOUNTER — Encounter: Payer: Self-pay | Admitting: Family

## 2014-03-17 ENCOUNTER — Ambulatory Visit: Payer: Medicare Other | Admitting: Family

## 2014-03-17 ENCOUNTER — Inpatient Hospital Stay (HOSPITAL_COMMUNITY): Admission: RE | Admit: 2014-03-17 | Payer: Medicare Other | Source: Ambulatory Visit

## 2014-03-21 ENCOUNTER — Ambulatory Visit (INDEPENDENT_AMBULATORY_CARE_PROVIDER_SITE_OTHER): Payer: Medicare HMO | Admitting: Internal Medicine

## 2014-03-21 ENCOUNTER — Encounter: Payer: Self-pay | Admitting: Pulmonary Disease

## 2014-03-21 ENCOUNTER — Telehealth: Payer: Self-pay | Admitting: Internal Medicine

## 2014-03-21 ENCOUNTER — Encounter: Payer: Self-pay | Admitting: Internal Medicine

## 2014-03-21 VITALS — BP 132/78 | HR 87 | Ht 62.0 in | Wt 118.2 lb

## 2014-03-21 DIAGNOSIS — R222 Localized swelling, mass and lump, trunk: Secondary | ICD-10-CM

## 2014-03-21 DIAGNOSIS — J439 Emphysema, unspecified: Secondary | ICD-10-CM

## 2014-03-21 DIAGNOSIS — R918 Other nonspecific abnormal finding of lung field: Secondary | ICD-10-CM

## 2014-03-21 DIAGNOSIS — J438 Other emphysema: Secondary | ICD-10-CM

## 2014-03-21 NOTE — Progress Notes (Signed)
Patient ID: Jennifer Duran, female    DOB: Dec 03, 1929, 80 y.o.   MRN: [id number]  HPI 9/89/21- COPD complicated by DM.   Husband and daughter here Last here December 20, 2010- reviewed several labs that visit.  She feels she is doing pretty well. No acute problems since last here. Has stayed in a lot to avoid the Spring pollen and the recent poor air quality and heat. Remains on continuous oxygen at 2 L/[employer]. Diabetic neuropathic paresthesias can disturb sleep some. She and family wanted to discuss portable concentrator or liquid oxygen as alternative to her portable tanks.   12/26/11- 16 yoF  Former smoker, followed for COPD complicated by DM, HBP. Here w/ husband. Had flu shot. Gradually aware of it easier dyspnea with exertion over the last 6 months. No dramatic events and no recent colds. She does not cough. Has noted a little palpitation at times without chest pain or syncope. She is now using a portable oxygen concentrator-battery lasts for hours-set on 3 L.  06/24/12- 82 yoF  Former smoker, followed for COPD complicated by DM, HBP. Here w/ husband. Doing well overall; unless heat then has to stay in doors as much as possible. She very much likes her portable oxygen concentrator 2 L per minute. When humidity is high she switches to 3 L. Oxygen flow is causing dry epistaxis COPD assessment test (CAT) score 14/40 Finances are tight for medication cost.  12/24/12- 27 yoF  Former smoker, followed for COPD complicated by DM, HBP.      Widowed 6 month follow up.  O2 sat 86% on 2 lpm pulsed upon arrival to exam room.  Reports breathing has gradually worsened since the summer.  Having increased DOE and wheezing at times.  No chest tightness, chest pain, or cough at this time. Breathing got worse when her husband died. She seems to recognize components of deconditioning and grief/depression. Her son and daughter are able to look in and help some. I asked her to consider no longer living  alone. CT chest 12/02/12- IMPRESSION:  1. The area of concern at the right lung base probably represents  superimposed vascular and osseous shadows.  2. However, there are If the patient is at low risk for  bronchogenic carcinoma, follow-up chest CT at 12 months is  recommended. This recommendation follows the consensus statement:  Guidelines for Management of Small Pulmonary Nodules Detected on CT  Scans: A Statement from the [employer] as published in  Radiology 2005; 237:395-400.  3. Severe emphysema.  4. Prominent atherosclerosis.  Original Report Authenticated By: Van Clines, M.D.   05/25/13- 54 yoF  Former smoker, followed for COPD complicated by DM, HBP.      Widowed FOLLOWS JHE:RDEYCX up visit per CY-seen in ER-CT chest showed nodules. Daughter here is HCPOA Using 3L O2 sleep and exertion, 2L if quiet. Dry cough. No nodes, chest pain or blood. ER 5/24 - woke w/ palpitations.  CT chest changed so she was told to come in: CT chest 05/21/13 IMPRESSION:  Severe emphysematous changes consistent with COPD.  2.6 x 1.4 x 2.5 cm diameter spiculated density in left upper lobe  significantly increased in size since previous study consistent  with a pulmonary neoplasm.  Original Report Authenticated By: Lavonia Dana, M.D.  07/18/13- 14 yoF  Former smoker, followed for COPD complicated by DM, HBP.      Widowed Daughter here. At Thoracic Conference 06/16/13 we reviewed images, and Dr Donnamae Jude Onc indicated he would  probably be willing to offer SBRT XRT w/o bx if PET was consistent w/ CA. I called her about this, ordered  Biaxin, and sent Quant TB Assay ( NEG 06/17/13) to eval for possible infection.  ER for weakness 4 days ago. Noted potassium and magnesium low. They gave prednisone. PET 06/22/13- ( after 1 week biaxin) IMPRESSION:  1. Hypermetabolic left upper lobe pulmonary nodule is concerning  for bronchogenic carcinoma. If indeed this is carcinoma, FDG PET  staging T1a  N0 M0  2. No evidence metastasis.  3. Emphysematous change in the lungs  Original Report Authenticated By: Suzy Bouchard, M.D. CXR 07/15/13 IMPRESSION:  Emphysema without acute cardiopulmonary findings.  Left upper lobe nodule.  Original Report Authenticated By: Misty Stanley, M.D.   11/18/13- 38 yoF  Former smoker, followed for COPD, LUL nodule assumed CA/ SBRT,  complicated by DM, HBP.      Widowed Daughter here. FOLLOWS FOR: denies any wheezing, cough, congestion, or SOB unless over exerting herself. Completed SBRT/ Dr Sondra Come presumptively to LUL nodule. O2 2L Has had radiation treatment for lung nodule empiricallly managed as bronchogenic carcinoma.  Moving to Assisted Living in January. O2 2L continuous/ [employer]. Denies pain and feels stable.  03/21/14- 25 yoF  Former smoker, followed for COPD, LUL nodule assumed CA/ SBRT/RadOnc,  complicated by DM, HBP.      Widowed FOLLOWS FOR: Pt states her breathing started getting worse since last visit-more so with activity; then had the flu after getting the flu shot. [facility] at [facility] for COPD in Feb 2015. Nursing home for rehabilitation after hospitalized for acute exacerbation of COPD. Now using oxygen 2-3 L for exertion. Denies having any cough or chest pain at all. CT chest 12/08/13 IMPRESSION:  1. No acute cardiopulmonary abnormalities.  2. Decrease in size of spiculated nodular density in the left upper  lobe compatible with response to therapy.  3. Stable nodule in the right lung.  4. Coronary artery calcifications.  Electronically Signed  By: Kerby Moors M.D.  On: 12/08/2013 16:05 CXR 02/23/14 IMPRESSION:  1. Resolved left basilar atelectasis.  2. Marked lung hyperexpansion without definite acute cardiopulmonary  disease on this AP portable examination.  Electronically Signed  By: Sandi Mariscal M.D.  On: 02/24/2014 07:22   Review of Systems- see HPI Constitutional:   No-   weight loss, night sweats, fevers, chills, fatigue,  lassitude. HEENT:   No-  headaches, difficulty swallowing, tooth/dental problems, sore throat,       No-  sneezing, itching, ear ache, nasal congestion, post nasal drip,  CV:  No-   chest pain, orthopnea, PND, swelling in lower extremities, anasarca, dizziness, +palpitations Resp: +  shortness of breath with exertion, not at rest.              No-   productive cough,  No- non-productive cough,  No- coughing up of blood.              No-   change in color of mucus.  No- wheezing.   Skin: No-   rash or lesions. GI:  No-   heartburn, indigestion, abdominal pain, nausea, vomiting, GU:  MS:  No-   joint pain or swelling.   Neuro-     nothing unusual Psych:  No- change in mood or affect. + depression or anxiety.  No memory loss.  Objective:   General- Alert, Oriented, Affect+depressed, crying intermittently, Distress- none acute,              2l /  94% by her portable concentrator      Frail, elderly, week, wheelchair. Skin- rash-none, lesions- none, excoriation- none.  Lymphadenopathy- none Head- atraumatic            Eyes- Gross vision intact, PERRLA, conjunctivae clear secretions            Ears- Hearing, canals-normal            Nose- Clear, no-Septal dev, mucus, polyps, erosion, perforation             Throat- Mallampati II , mucosa clear , drainage- none, tonsils- atrophic Neck- flexible , trachea midline, no stridor , thyroid nl, carotid no bruit Chest - symmetrical excursion , unlabored           Heart/CV- RRR , no murmur , no gallop  , no rub, nl s1 s2                           - JVD- none , edema- none, stasis changes- none, varices- none           Lung- +clear to P&A- very distant, wheeze- none, cough- none , dullness-none, rub- none           Chest wall-  Abd-  Br/ Gen/ Rectal- Not done, not indicated Extrem- cyanosis- none, clubbing, none, atrophy- none, wheelchair Neuro- grossly intact to observation

## 2014-03-21 NOTE — Patient Instructions (Signed)
We can continue present meds  My staff will help to see when Dr Sondra Come wants to see you back for radiation oncology follow-up. Dr Sondra Come will decide about future CT scans.

## 2014-03-22 NOTE — Telephone Encounter (Signed)
Error

## 2014-03-28 ENCOUNTER — Non-Acute Institutional Stay (SKILLED_NURSING_FACILITY): Payer: PRIVATE HEALTH INSURANCE | Admitting: Internal Medicine

## 2014-03-28 ENCOUNTER — Encounter: Payer: Self-pay | Admitting: Internal Medicine

## 2014-03-28 DIAGNOSIS — R5383 Other fatigue: Secondary | ICD-10-CM

## 2014-03-28 DIAGNOSIS — E119 Type 2 diabetes mellitus without complications: Secondary | ICD-10-CM

## 2014-03-28 DIAGNOSIS — I10 Essential (primary) hypertension: Secondary | ICD-10-CM

## 2014-03-28 DIAGNOSIS — F411 Generalized anxiety disorder: Secondary | ICD-10-CM

## 2014-03-28 DIAGNOSIS — R5381 Other malaise: Secondary | ICD-10-CM

## 2014-03-28 DIAGNOSIS — J9620 Acute and chronic respiratory failure, unspecified whether with hypoxia or hypercapnia: Secondary | ICD-10-CM

## 2014-03-28 DIAGNOSIS — I739 Peripheral vascular disease, unspecified: Secondary | ICD-10-CM

## 2014-03-28 DIAGNOSIS — R531 Weakness: Secondary | ICD-10-CM

## 2014-03-28 DIAGNOSIS — J441 Chronic obstructive pulmonary disease with (acute) exacerbation: Secondary | ICD-10-CM

## 2014-03-28 NOTE — Progress Notes (Signed)
MRN: [id number] Name: Jennifer Duran  Sex: female Age: 80 y.o. DOB: 09-Mar-1929  [facility]: [facility] Facility/Room: 118A Level Of Care: SNF Provider: Inocencio Homes D Emergency Contacts: Extended Emergency Contact Information Primary Emergency Contact: Nydia Bouton [place] of [place] Phone: [phone] Mobile Phone: [phone] Relation: Daughter Secondary Emergency Contact: Brown,Hunter Address: [address], [place] [address] Johnnette Litter of [place] Phone: [phone] Relation: Grandson  Code Status: FULL  Allergies: Celecoxib; Codeine; Metronidazole; Pregabalin; Propranolol hcl; Rofecoxib; Iodine; and Pregabalin  Chief Complaint  Patient presents with  . Discharge Note    HPI: Patient is 80 y.o. female who was admitted after acute respiratory failure who is now ready for discharge to [facility].  Past Medical History  Diagnosis Date  . Allergic rhinitis, cause unspecified   . Unspecified essential hypertension   . Cor pulmonale   . COPD (chronic obstructive pulmonary disease)   . Type II or unspecified type diabetes mellitus without mention of complication, not stated as uncontrolled   . Osteoporosis   . Hashimoto's disease   . Peripheral vascular disease   . Hyperlipidemia   . Depression   . Urinary frequency   . Other atopic dermatitis and related conditions   . Abnormal involuntary movements(781.0)   . Hyposmolality and/or hyponatremia   . Peripheral vascular disease, unspecified   . Lung mass   . Insomnia   . History of radiation therapy 08/08/2013, 08/10/2013, 08/16/2013    SBRT 54 Gy to left upper lobe pulmonary nodule    Past Surgical History  Procedure Laterality Date  . Total abdominal hysterectomy    . Cataracts    . Rotator cuff repair    . Mastectomy Right 1952  . Eye surgery        Medication List       This list is accurate as of: 03/28/14  1:19 PM.  Always use your most recent med list.               albuterol 108 (90 BASE) MCG/ACT inhaler  Commonly known as:  PROVENTIL HFA;VENTOLIN HFA  Inhale 2 puffs into the lungs every 4 (four) hours as needed for wheezing or shortness of breath.     citalopram 40 MG tablet  Commonly known as:  CELEXA  Take one tablet by mouth once daily     clonazePAM 0.5 MG tablet  Commonly known as:  KLONOPIN  0.5 mg. Take 1/2 tablet every morning prn anxiety and 1 tablet every evening prn anxiety and 1/2 (0.25 mg) q 12 h prn     clopidogrel 75 MG tablet  Commonly known as:  PLAVIX  Take 75 mg by mouth daily with breakfast.     ferrous sulfate 325 (65 FE) MG tablet  Take 325 mg by mouth daily.     furosemide 40 MG tablet  Commonly known as:  LASIX  Take 40 mg by mouth daily.     guaiFENesin 600 MG 12 hr tablet  Commonly known as:  MUCINEX  Take 1,200 mg by mouth 2 (two) times daily.     insulin aspart 100 UNIT/ML injection  Commonly known as:  novoLOG  Inject 5 Units into the skin 3 (three) times daily before meals.     insulin glargine 100 UNIT/ML injection  Commonly known as:  LANTUS  Inject 10 Units into the skin at bedtime.     Insulin Pen Needle 31G X 8 MM  Misc  Check blood sugar four times daily. DX: 250.00 and 401.9     lisinopril 10 MG tablet  Commonly known as:  PRINIVIL,ZESTRIL  Take 2 tablets (20 mg total) by mouth daily.     ondansetron 4 MG disintegrating tablet  Commonly known as:  ZOFRAN ODT  Take 1 tablet (4 mg total) by mouth every 8 (eight) hours as needed for nausea.     tiotropium 18 MCG inhalation capsule  Commonly known as:  SPIRIVA HANDIHALER  Place 1 capsule (18 mcg total) into inhaler and inhale daily.     traMADol 50 MG tablet  Commonly known as:  ULTRAM  Take two tablets by mouth at bedtime        Meds ordered this encounter  Medications  . clopidogrel (PLAVIX) 75 MG tablet    Sig: Take 75 mg by mouth daily with breakfast.    Immunization History  Administered Date(s) Administered  .  Influenza Split 09/22/2011, 10/20/2012, 09/28/2013  . Influenza Whole 09/17/2009, 10/14/2010  . Pneumococcal Conjugate-13 11/18/2013  . Pneumococcal Polysaccharide-23 10/09/2000, 10/29/2010  . Pneumococcal-Unspecified 02/17/1995  . Td 10/06/2001    History  Substance Use Topics  . Smoking status: Former Smoker -- 2.00 packs/day for 53 years  . Smokeless tobacco: Never Used  . Alcohol Use: No    Filed Vitals:   03/28/14 1258  BP: 125/76  Pulse: 65  Temp: 97.1 F (36.2 C)  Resp: 22    Physical Exam  GENERAL APPEARANCE: Alert, conversant. Appropriately groomed. No acute distress.  HEENT: Unremarkable. RESPIRATORY: Breathing is even, unlabored. Lung sounds are clear   CARDIOVASCULAR: Heart RRR no murmurs, rubs or gallops. No peripheral edema.  GASTROINTESTINAL: Abdomen is soft, non-tender, not distended w/ normal bowel sounds.  NEUROLOGIC: Cranial nerves 2-12 grossly intact. Moves all extremities no tremor.  Patient Active Problem List   Diagnosis Date Noted  . Acute on chronic respiratory failure 02/23/2014  . COPD (chronic obstructive pulmonary disease) 02/22/2014  . COPD exacerbation 02/22/2014  . Hypokalemia 07/18/2013  . Generalized weakness 05/28/2013  . Cor pulmonale   . Lung mass   . Insomnia   . Depression 05/03/2013  . Unspecified hereditary and idiopathic peripheral neuropathy 05/03/2013  . Generalized anxiety disorder 05/03/2013  . Lung nodules 01/05/2013  . Embolism and thrombosis of iliac artery 02/04/2012  . Peripheral vascular disease 02/04/2012  . DYSPNEA 12/06/2010  . Type II or unspecified type diabetes mellitus without mention of complication, not stated as uncontrolled 11/29/2010  . ALLERGIC RHINITIS 03/14/2008  . COPD with emphysema 03/14/2008  . HYPERTENSION 02/02/2008    CBC    Component Value Date/Time   WBC 9.6 02/24/2014 0300   WBC 8.0 12/01/2013 1508   RBC 4.49 02/24/2014 0300   RBC 4.49 12/01/2013 1508   HGB 12.2 02/24/2014 0300    HCT 37.7 02/24/2014 0300   PLT 357 02/24/2014 0300   MCV 84.0 02/24/2014 0300   LYMPHSABS 3.0 02/22/2014 0950   LYMPHSABS 1.3 12/01/2013 1508   MONOABS 0.8 02/22/2014 0950   EOSABS 0.1 02/22/2014 0950   EOSABS 0.3 12/01/2013 1508   BASOSABS 0.0 02/22/2014 0950   BASOSABS 0.0 12/01/2013 1508    CMP     Component Value Date/Time   NA 142 02/25/2014 0400   NA 142 12/01/2013 1508   K 3.9 02/27/2014 0755   CL 101 02/25/2014 0400   CO2 29 02/25/2014 0400   GLUCOSE 166* 02/25/2014 0400   GLUCOSE 155* 12/01/2013 1508   BUN 26*  02/25/2014 0400   BUN 12 12/01/2013 1508   BUN 15.3 11/28/2013 1549   CREATININE 0.70 02/25/2014 0400   CREATININE 0.9 11/28/2013 1549   CALCIUM 8.6 02/25/2014 0400   PROT 8.1 02/23/2014 0320   PROT 6.1 12/01/2013 1508   ALBUMIN 3.4* 02/23/2014 0320   AST 28 02/23/2014 0320   ALT 8 02/23/2014 0320   ALKPHOS 60 02/23/2014 0320   BILITOT 0.4 02/23/2014 0320   GFRNONAA 79* 02/25/2014 0400   GFRAA >90 02/25/2014 0400    Assessment and Plan  Patient is improved and stable for discharge to [facility]. She needs to be on O2 3L [place] chronically.  Hennie Duos, MD

## 2014-03-30 ENCOUNTER — Encounter: Payer: Self-pay | Admitting: Family

## 2014-03-30 DIAGNOSIS — E119 Type 2 diabetes mellitus without complications: Secondary | ICD-10-CM

## 2014-03-30 DIAGNOSIS — J438 Other emphysema: Secondary | ICD-10-CM

## 2014-03-30 DIAGNOSIS — J441 Chronic obstructive pulmonary disease with (acute) exacerbation: Secondary | ICD-10-CM

## 2014-03-30 DIAGNOSIS — Z9981 Dependence on supplemental oxygen: Secondary | ICD-10-CM

## 2014-04-03 ENCOUNTER — Encounter: Payer: Self-pay | Admitting: Internal Medicine

## 2014-04-03 ENCOUNTER — Encounter (HOSPITAL_COMMUNITY): Payer: Medicare Other

## 2014-04-03 ENCOUNTER — Ambulatory Visit: Payer: Medicare Other | Admitting: Family

## 2014-04-03 DIAGNOSIS — J09X2 Influenza due to identified novel influenza A virus with other respiratory manifestations: Secondary | ICD-10-CM | POA: Insufficient documentation

## 2014-04-03 DIAGNOSIS — J9620 Acute and chronic respiratory failure, unspecified whether with hypoxia or hypercapnia: Secondary | ICD-10-CM | POA: Insufficient documentation

## 2014-04-03 DIAGNOSIS — J189 Pneumonia, unspecified organism: Secondary | ICD-10-CM | POA: Insufficient documentation

## 2014-04-03 DIAGNOSIS — E1149 Type 2 diabetes mellitus with other diabetic neurological complication: Secondary | ICD-10-CM | POA: Insufficient documentation

## 2014-04-05 ENCOUNTER — Ambulatory Visit: Payer: PRIVATE HEALTH INSURANCE | Admitting: Nurse Practitioner

## 2014-04-12 ENCOUNTER — Ambulatory Visit (INDEPENDENT_AMBULATORY_CARE_PROVIDER_SITE_OTHER): Payer: PRIVATE HEALTH INSURANCE | Admitting: Ophthalmology

## 2014-04-12 ENCOUNTER — Encounter: Payer: Self-pay | Admitting: Family

## 2014-04-13 ENCOUNTER — Ambulatory Visit (INDEPENDENT_AMBULATORY_CARE_PROVIDER_SITE_OTHER): Payer: Medicare HMO | Admitting: Family

## 2014-04-13 ENCOUNTER — Ambulatory Visit (HOSPITAL_COMMUNITY)
Admission: RE | Admit: 2014-04-13 | Discharge: 2014-04-13 | Disposition: A | Discharge status: Home / Self Care | Payer: Medicare HMO | Source: Ambulatory Visit | Attending: Family | Admitting: Family

## 2014-04-13 ENCOUNTER — Encounter: Payer: Self-pay | Admitting: Family

## 2014-04-13 VITALS — BP 93/55 | HR 90 | Resp 16 | Ht 62.5 in | Wt 117.0 lb

## 2014-04-13 DIAGNOSIS — I739 Peripheral vascular disease, unspecified: Secondary | ICD-10-CM | POA: Insufficient documentation

## 2014-04-13 DIAGNOSIS — R531 Weakness: Secondary | ICD-10-CM | POA: Insufficient documentation

## 2014-04-13 DIAGNOSIS — R5381 Other malaise: Secondary | ICD-10-CM

## 2014-04-13 DIAGNOSIS — R5383 Other fatigue: Secondary | ICD-10-CM

## 2014-04-13 NOTE — Patient Instructions (Signed)
Peripheral Vascular Disease Peripheral Vascular Disease (PVD), also called Peripheral Arterial Disease (PAD), is a circulation problem caused by cholesterol (atherosclerotic plaque) deposits in the arteries. PVD commonly occurs in the lower extremities (legs) but it can occur in other areas of the body, such as your arms. The cholesterol buildup in the arteries reduces blood flow which can cause pain and other serious problems. The presence of PVD can place a person at risk for Coronary Artery Disease (CAD).  CAUSES  Causes of PVD can be many. It is usually associated with more than one risk factor such as:   High Cholesterol.  Smoking.  Diabetes.  Lack of exercise or inactivity.  High blood pressure (hypertension).  Obesity.  Family history. SYMPTOMS   When the lower extremities are affected, patients with PVD may experience:  Leg pain with exertion or physical activity. This is called INTERMITTENT CLAUDICATION. This may present as cramping or numbness with physical activity. The location of the pain is associated with the level of blockage. For example, blockage at the abdominal level (distal abdominal aorta) may result in buttock or hip pain. Lower leg arterial blockage may result in calf pain.  As PVD becomes more severe, pain can develop with less physical activity.  In people with severe PVD, leg pain may occur at rest.  Other PVD signs and symptoms:  Leg numbness or weakness.  Coldness in the affected leg or foot, especially when compared to the other leg.  A change in leg color.  Patients with significant PVD are more prone to ulcers or sores on toes, feet or legs. These may take longer to heal or may reoccur. The ulcers or sores can become infected.  If signs and symptoms of PVD are ignored, gangrene may occur. This can result in the loss of toes or loss of an entire limb.  Not all leg pain is related to PVD. Other medical conditions can cause leg pain such  as:  Blood clots (embolism) or Deep Vein Thrombosis.  Inflammation of the blood vessels (vasculitis).  Spinal stenosis. DIAGNOSIS  Diagnosis of PVD can involve several different types of tests. These can include:  Pulse Volume Recording Method (PVR). This test is simple, painless and does not involve the use of X-rays. PVR involves measuring and comparing the blood pressure in the arms and legs. An ABI (Ankle-Brachial Index) is calculated. The normal ratio of blood pressures is 1. As this number becomes smaller, it indicates more severe disease.  < 0.95  indicates significant narrowing in one or more leg vessels.  <0.8 there will usually be pain in the foot, leg or buttock with exercise.  <0.4 will usually have pain in the legs at rest.  <0.25  usually indicates limb threatening PVD.  Doppler detection of pulses in the legs. This test is painless and checks to see if you have a pulses in your legs/feet.  A dye or contrast material (a substance that highlights the blood vessels so they show up on x-ray) may be given to help your caregiver better see the arteries for the following tests. The dye is eliminated from your body by the kidney's. Your caregiver may order blood work to check your kidney function and other laboratory values before the following tests are performed:  Magnetic Resonance Angiography (MRA). An MRA is a picture study of the blood vessels and arteries. The MRA machine uses a large magnet to produce images of the blood vessels.  Computed Tomography Angiography (CTA). A CTA is a   specialized x-ray that looks at how the blood flows in your blood vessels. An IV may be inserted into your arm so contrast dye can be injected.  Angiogram. Is a procedure that uses x-rays to look at your blood vessels. This procedure is minimally invasive, meaning a small incision (cut) is made in your groin. A small tube (catheter) is then inserted into the artery of your groin. The catheter is  guided to the blood vessel or artery your caregiver wants to examine. Contrast dye is injected into the catheter. X-rays are then taken of the blood vessel or artery. After the images are obtained, the catheter is taken out. TREATMENT  Treatment of PVD involves many interventions which may include:  Lifestyle changes:  Quitting smoking.  Exercise.  Following a low fat, low cholesterol diet.  Control of diabetes.  Foot care is very important to the PVD patient. Good foot care can help prevent infection.  Medication:  Cholesterol-lowering medicine.  Blood pressure medicine.  Anti-platelet drugs.  Certain medicines may reduce symptoms of Intermittent Claudication.  Interventional/Surgical options:  Angioplasty. An Angioplasty is a procedure that inflates a balloon in the blocked artery. This opens the blocked artery to improve blood flow.  Stent Implant. A wire mesh tube (stent) is placed in the artery. The stent expands and stays in place, allowing the artery to remain open.  Peripheral Bypass Surgery. This is a surgical procedure that reroutes the blood around a blocked artery to help improve blood flow. This type of procedure may be performed if Angioplasty or stent implants are not an option. SEEK IMMEDIATE MEDICAL CARE IF:   You develop pain or numbness in your arms or legs.  Your arm or leg turns cold, becomes blue in color.  You develop redness, warmth, swelling and pain in your arms or legs. MAKE SURE YOU:   Understand these instructions.  Will watch your condition.  Will get help right away if you are not doing well or get worse. Document Released: 01/22/2005 Document Revised: 03/08/2012 Document Reviewed: 12/19/2008 Exit[employer] Patient Information 2014 [employer].  

## 2014-04-13 NOTE — Progress Notes (Signed)
[facility] HISTORY AND PHYSICAL -PAD  History of Present Illness Analia Zuk Nordquist is a 80 y.o. female patient that Dr. Scot Dock has been following for stable infrainguinal arterial occlusive disease. She had followed Dr. Nicole Cella advice to increase her walking when her knees gave out, she states about a year ago. She denies claudication symptoms with walking, denies non healing wounds. She denies history of stroke or TIA. She is a resident of a nursing facility.   Pt has not had previous peripheral vascular intervention.  The patient reports New Medical or Surgical History: newly diagnosed DM, taking basal/bolus insulin.  Was hospitalized at [facility] end of February/March with the flu. She uses 2 L/min O2 during the day, 3L at night, on portable O2 now.  Pt Diabetic: Yes,  Pt smoker: former smoker, smoked for 53 years, started at age 73, quit a few years ago  Pt meds include: Statin :No, states her cholesterol is fine ASA: Yes Other anticoagulants/antiplatelets: Plavix  Past Medical History  Diagnosis Date  . Allergic rhinitis, cause unspecified   . Unspecified essential hypertension   . Cor pulmonale   . COPD (chronic obstructive pulmonary disease)   . Type II or unspecified type diabetes mellitus without mention of complication, not stated as uncontrolled   . Osteoporosis   . Hashimoto's disease   . Peripheral vascular disease   . Hyperlipidemia   . Depression   . Urinary frequency   . Other atopic dermatitis and related conditions   . Abnormal involuntary movements(781.0)   . Hyposmolality and/or hyponatremia   . Peripheral vascular disease, unspecified   . Lung mass   . Insomnia   . History of radiation therapy 08/08/2013, 08/10/2013, 08/16/2013    SBRT 54 Gy to left upper lobe pulmonary nodule    Social History History  Substance Use Topics  . Smoking status: Former Smoker -- 2.00 packs/day for 53 years  . Smokeless tobacco: Never Used   . Alcohol Use: No    Family History Family History  Problem Relation Age of Onset  . Cancer Mother     lung   . Lupus Father   . Heart disease Sister     Past Surgical History  Procedure Laterality Date  . Total abdominal hysterectomy    . Cataracts    . Rotator cuff repair    . Mastectomy Right 1952  . Eye surgery    . Radiation implant, female Left Aug. 2014    Left Breast  3 Tx's    Allergies  Allergen Reactions  . Celecoxib Swelling  . Codeine Nausea And Vomiting  . Metronidazole Other (See Comments)    neuropathy  . Pregabalin Swelling  . Propranolol Hcl Other (See Comments)    Unknown  . Rofecoxib Other (See Comments)    Unknown  . Iodine Rash    Only if ingested  . Pregabalin Hives and Rash    Current Outpatient Prescriptions  Medication Sig Dispense Refill  . albuterol (PROVENTIL HFA;VENTOLIN HFA) 108 (90 BASE) MCG/ACT inhaler Inhale 2 puffs into the lungs every 4 (four) hours as needed for wheezing or shortness of breath.  1 Inhaler  prn  . citalopram (CELEXA) 40 MG tablet Take one tablet by mouth once daily  90 tablet  1  . clonazePAM (KLONOPIN) 0.5 MG tablet 0.5 mg. Take 1/2 tablet every morning prn anxiety and 1 tablet every evening prn anxiety and 1/2 (0.25 mg) q 12 h prn      .  clopidogrel (PLAVIX) 75 MG tablet Take 75 mg by mouth daily with breakfast.      . ferrous sulfate 325 (65 FE) MG tablet Take 325 mg by mouth daily.      . Fluticasone-Salmeterol (ADVAIR) 250-50 MCG/DOSE AEPB Inhale 1 puff into the lungs 2 (two) times daily.      . furosemide (LASIX) 40 MG tablet Take 40 mg by mouth daily.      Marland Kitchen guaiFENesin (MUCINEX) 600 MG 12 hr tablet Take 1,200 mg by mouth 2 (two) times daily.      . insulin aspart (NOVOLOG) 100 UNIT/ML injection Inject 5 Units into the skin 3 (three) times daily before meals.       . insulin glargine (LANTUS) 100 UNIT/ML injection Inject 10 Units into the skin at bedtime.        . Insulin Pen Needle 31G X 8 MM MISC Check  blood sugar four times daily. DX: 250.00 and 401.9  120 each  11  . lisinopril (PRINIVIL,ZESTRIL) 10 MG tablet Take 2 tablets (20 mg total) by mouth daily.      . ondansetron (ZOFRAN ODT) 4 MG disintegrating tablet Take 1 tablet (4 mg total) by mouth every 8 (eight) hours as needed for nausea.  10 tablet  0  . tiotropium (SPIRIVA HANDIHALER) 18 MCG inhalation capsule Place 1 capsule (18 mcg total) into inhaler and inhale daily.  90 capsule  3  . traMADol (ULTRAM) 50 MG tablet Take two tablets by mouth at bedtime  60 tablet  5   No current facility-administered medications for this visit.    ROS: See HPI for pertinent positives and negatives.   Physical Examination  Filed Vitals:   04/13/14 1557  BP: 93/55  Pulse: 90  Resp: 16   Filed Weights   04/13/14 1557  Weight: 117 lb (53.071 kg)   Body mass index is 21.05 kg/(m^2).   General: A&O x 3, WDWN. Gait: in wheelchair Eyes: PERRLA. Pulmonary: CTAB, without wheezes , rales or rhonchi, decreased air movement in all fields. Cardiac: regular Rythm , without detected murmur.         Carotid Bruits Left Right   Negative Negative   Radial pulses: are 2+ palpable and =.                           VASCULAR EXAM: Extremities without ischemic changes  without Gangrene; without open wounds.                                                                                                          LE Pulses LEFT RIGHT       FEMORAL  not palpable  not palpable        POPLITEAL  not palpable   not palpable       POSTERIOR TIBIAL  not palpable   not palpable        DORSALIS PEDIS      ANTERIOR TIBIAL not palpable  not palpable    Abdomen: soft, NT,  no masses. Skin: no rashes, no ulcers noted. Musculoskeletal: no muscle wasting or atrophy.  Neurologic: A&O X 3; Appropriate Affect ; SENSATION: normal; MOTOR FUNCTION:  moving all extremities equally, motor strength 5/5 throughout. Speech is fluent/normal. CN 2-12  intact.    Non-Invasive Vascular Imaging: DATE: 04/13/2014 ABI: RIGHT 0.75, Waveforms: bi and monophasic;  LEFT 0.54, Waveforms: mono and bipahsic Previous (02/23/2013) ABI's: Right: 0.56; Left: 0.43  ASSESSMENT: PRANATHI WINFREE is a 80 y.o. female who presents for follow up for stable infrainguinal arterial occlusive disease. Her ABI's have improved, likely as a result of leg exercises.  PLAN:  Graduated walking program when able; until then leg and arm exercises several times daily as she has been directed by the physical therapists. I discussed in depth with the patient the nature of atherosclerosis, and emphasized the importance of maximal medical management including strict control of blood pressure, blood glucose, and lipid levels, obtaining regular exercise, and continued cessation of smoking.  The patient is aware that without maximal medical management the underlying atherosclerotic disease process will progress, limiting the benefit of any interventions.  Based on the patient's vascular studies and examination, pt will return to clinic in 1 year for ABI's.  She knows to return sooner if she develops rest pain or non-healing wounds in her lower extremities.  The patient was given information about PAD including signs, symptoms, treatment, what symptoms should prompt the patient to seek immediate medical care, and risk reduction measures to take.  Clemon Chambers, RN, MSN, FNP-C [facility] Phone: [phone]  Clinic MD: Oneida Alar  04/13/2014 4:22 PM

## 2014-04-14 NOTE — Assessment & Plan Note (Signed)
She will keep pending appointment with radiation oncology

## 2014-04-14 NOTE — Assessment & Plan Note (Signed)
Now controlled on medications and dependent on oxygen with very limited exercise tolerance Plan-continue to follow no change in meds

## 2014-04-17 NOTE — Addendum Note (Signed)
Addended by: Dorthula Rue L on: 04/17/2014 02:07 PM   Modules accepted: Orders

## 2014-04-27 ENCOUNTER — Encounter: Payer: Self-pay | Admitting: Radiation Oncology

## 2014-04-27 ENCOUNTER — Telehealth: Payer: Self-pay | Admitting: *Deleted

## 2014-04-27 ENCOUNTER — Ambulatory Visit
Admission: RE | Admit: 2014-04-27 | Discharge: 2014-04-27 | Disposition: A | Discharge status: Home / Self Care | Payer: Medicare HMO | Source: Ambulatory Visit | Attending: Radiation Oncology | Admitting: Radiation Oncology

## 2014-04-27 VITALS — BP 115/75 | HR 100 | Temp 97.7°F | Ht 62.5 in | Wt 120.8 lb

## 2014-04-27 DIAGNOSIS — R918 Other nonspecific abnormal finding of lung field: Secondary | ICD-10-CM

## 2014-04-27 NOTE — Progress Notes (Signed)
Radiation Oncology         [phone] ________________________________  Name: Jennifer Duran MRN: [id number]  Date: 04/27/2014  DOB: 1929/03/08  Follow-Up Visit Note  CC: REED, TIFFANY, DO  Young, Clinton D, MD  Diagnosis:   PET positive pulmonary nodule presenting in the left upper lobe, clinical stage I   Interval Since Last Radiation:  8  months  Narrative:  The patient returns today for routine follow-up.  Interval history is significant for the patient admitted to the hospital for acute respiratory failure. She did improve with her hospitalization was transferred to the nursing home and then back to assisted care living.  She is on 2 L of oxygen during the day and 3 L at night.   she has a chronic clear cough. She denies any pain in the chest area.                              ALLERGIES:  is allergic to celecoxib; codeine; metronidazole; pregabalin; propranolol hcl; rofecoxib; iodine; and pregabalin.  Meds: Current Outpatient Prescriptions  Medication Sig Dispense Refill  . albuterol (PROVENTIL HFA;VENTOLIN HFA) 108 (90 BASE) MCG/ACT inhaler Inhale 2 puffs into the lungs every 4 (four) hours as needed for wheezing or shortness of breath.  1 Inhaler  prn  . citalopram (CELEXA) 40 MG tablet Take one tablet by mouth once daily  90 tablet  1  . clonazePAM (KLONOPIN) 0.5 MG tablet 0.5 mg. Take 1/2 tablet every morning prn anxiety and 1 tablet every evening prn anxiety and 1/2 (0.25 mg) q 12 h prn      . clopidogrel (PLAVIX) 75 MG tablet Take 75 mg by mouth daily with breakfast.      . ferrous sulfate 325 (65 FE) MG tablet Take 325 mg by mouth daily.      . Fluticasone-Salmeterol (ADVAIR) 250-50 MCG/DOSE AEPB Inhale 1 puff into the lungs 2 (two) times daily.      . furosemide (LASIX) 40 MG tablet Take 40 mg by mouth daily.      Marland Kitchen guaiFENesin (MUCINEX) 600 MG 12 hr tablet Take 1,200 mg by mouth 2 (two) times daily.      . insulin aspart (NOVOLOG) 100 UNIT/ML injection Inject 5  Units into the skin 3 (three) times daily before meals.       . insulin glargine (LANTUS) 100 UNIT/ML injection Inject 10 Units into the skin at bedtime.        . Insulin Pen Needle 31G X 8 MM MISC Check blood sugar four times daily. DX: 250.00 and 401.9  120 each  11  . IPRATROPIUM BROMIDE HFA IN Inhale into the lungs.      Marland Kitchen lisinopril (PRINIVIL,ZESTRIL) 10 MG tablet Take 2 tablets (20 mg total) by mouth daily.      . ondansetron (ZOFRAN ODT) 4 MG disintegrating tablet Take 1 tablet (4 mg total) by mouth every 8 (eight) hours as needed for nausea.  10 tablet  0  . tiotropium (SPIRIVA HANDIHALER) 18 MCG inhalation capsule Place 1 capsule (18 mcg total) into inhaler and inhale daily.  90 capsule  3  . traMADol (ULTRAM) 50 MG tablet Take two tablets by mouth at bedtime  60 tablet  5   No current facility-administered medications for this encounter.    Physical Findings: The patient is in no acute distress. Patient is alert and oriented.  height is 5' 2.5" (1.588 m) and weight  is 120 lb 12.8 oz (54.795 kg). Her temperature is 97.7 F (36.5 C). Her blood pressure is 115/75 and her pulse is 100. Her oxygen saturation is 91%. . No palpable supraclavicular or axillary adenopathy. Lungs are clear with breath sounds in general distant. The heart has a regular rhythm and rate.  Lab Findings: Lab Results  Component Value Date   WBC 9.6 02/24/2014   HGB 12.2 02/24/2014   HCT 37.7 02/24/2014   MCV 84.0 02/24/2014   PLT 357 02/24/2014      Radiographic Findings: No results found.  Impression: PET positive pulmonary nodule presenting in the left upper lobe, clinical stage I,  status post SBRT   Plan:  Chest CT scan in August with followup soon afterwards.  ____________________________________ Blair Promise, MD

## 2014-04-27 NOTE — Progress Notes (Addendum)
Jennifer Duran here for follow up after treatment to her left upper lobe.  She denies pain today.  She reports a frequent, productive cough.  She reports she is taking nebulizers and inhalers for the cough.  She denies hemoptysis.  She reports shortness of breath with activity.  Her oxygen saturation today is 91% on 2l via nasal canula.  She reports fatigue.  She was in the hospital  02/22/14-03/01/14 with the flu and went to rehab after.  She now resides at [facility] assisted living.

## 2014-04-27 NOTE — Telephone Encounter (Signed)
Called patient to inform of test and fu visit, lvm for a return call

## 2014-05-04 ENCOUNTER — Ambulatory Visit (INDEPENDENT_AMBULATORY_CARE_PROVIDER_SITE_OTHER): Payer: Medicare HMO | Admitting: Ophthalmology

## 2014-05-04 DIAGNOSIS — E11319 Type 2 diabetes mellitus with unspecified diabetic retinopathy without macular edema: Secondary | ICD-10-CM

## 2014-05-04 DIAGNOSIS — I10 Essential (primary) hypertension: Secondary | ICD-10-CM

## 2014-05-04 DIAGNOSIS — E1165 Type 2 diabetes mellitus with hyperglycemia: Secondary | ICD-10-CM

## 2014-05-04 DIAGNOSIS — E1139 Type 2 diabetes mellitus with other diabetic ophthalmic complication: Secondary | ICD-10-CM

## 2014-05-04 DIAGNOSIS — H35039 Hypertensive retinopathy, unspecified eye: Secondary | ICD-10-CM

## 2014-05-04 DIAGNOSIS — H43819 Vitreous degeneration, unspecified eye: Secondary | ICD-10-CM

## 2014-05-04 DIAGNOSIS — H2510 Age-related nuclear cataract, unspecified eye: Secondary | ICD-10-CM

## 2014-06-08 ENCOUNTER — Encounter (HOSPITAL_COMMUNITY): Payer: Self-pay | Admitting: Emergency Medicine

## 2014-06-08 ENCOUNTER — Emergency Department (HOSPITAL_COMMUNITY)
Admission: EM | Admit: 2014-06-08 | Discharge: 2014-06-08 | Disposition: A | Discharge status: Home / Self Care | Payer: Medicare HMO | Attending: Emergency Medicine | Admitting: Emergency Medicine

## 2014-06-08 ENCOUNTER — Emergency Department (HOSPITAL_COMMUNITY): Payer: Medicare HMO

## 2014-06-08 DIAGNOSIS — IMO0002 Reserved for concepts with insufficient information to code with codable children: Secondary | ICD-10-CM | POA: Insufficient documentation

## 2014-06-08 DIAGNOSIS — Z872 Personal history of diseases of the skin and subcutaneous tissue: Secondary | ICD-10-CM | POA: Insufficient documentation

## 2014-06-08 DIAGNOSIS — Z791 Long term (current) use of non-steroidal anti-inflammatories (NSAID): Secondary | ICD-10-CM | POA: Insufficient documentation

## 2014-06-08 DIAGNOSIS — Z794 Long term (current) use of insulin: Secondary | ICD-10-CM | POA: Insufficient documentation

## 2014-06-08 DIAGNOSIS — Z923 Personal history of irradiation: Secondary | ICD-10-CM | POA: Insufficient documentation

## 2014-06-08 DIAGNOSIS — Z79899 Other long term (current) drug therapy: Secondary | ICD-10-CM | POA: Insufficient documentation

## 2014-06-08 DIAGNOSIS — J441 Chronic obstructive pulmonary disease with (acute) exacerbation: Secondary | ICD-10-CM | POA: Insufficient documentation

## 2014-06-08 DIAGNOSIS — Z87891 Personal history of nicotine dependence: Secondary | ICD-10-CM | POA: Insufficient documentation

## 2014-06-08 DIAGNOSIS — Z7902 Long term (current) use of antithrombotics/antiplatelets: Secondary | ICD-10-CM | POA: Insufficient documentation

## 2014-06-08 DIAGNOSIS — E119 Type 2 diabetes mellitus without complications: Secondary | ICD-10-CM | POA: Insufficient documentation

## 2014-06-08 DIAGNOSIS — I10 Essential (primary) hypertension: Secondary | ICD-10-CM | POA: Insufficient documentation

## 2014-06-08 DIAGNOSIS — Z792 Long term (current) use of antibiotics: Secondary | ICD-10-CM | POA: Insufficient documentation

## 2014-06-08 DIAGNOSIS — M810 Age-related osteoporosis without current pathological fracture: Secondary | ICD-10-CM | POA: Insufficient documentation

## 2014-06-08 DIAGNOSIS — Z9981 Dependence on supplemental oxygen: Secondary | ICD-10-CM | POA: Insufficient documentation

## 2014-06-08 LAB — CBC
HCT: 41 % (ref 36.0–46.0)
Hemoglobin: 12.9 g/dL (ref 12.0–15.0)
MCH: 28.1 pg (ref 26.0–34.0)
MCHC: 31.5 g/dL (ref 30.0–36.0)
MCV: 89.3 fL (ref 78.0–100.0)
PLATELETS: 316 10*3/uL (ref 150–400)
RBC: 4.59 MIL/uL (ref 3.87–5.11)
RDW: 13.8 % (ref 11.5–15.5)
WBC: 8.8 10*3/uL (ref 4.0–10.5)

## 2014-06-08 LAB — BASIC METABOLIC PANEL
BUN: 15 mg/dL (ref 6–23)
CHLORIDE: 98 meq/L (ref 96–112)
CO2: 32 mEq/L (ref 19–32)
Calcium: 9.3 mg/dL (ref 8.4–10.5)
Creatinine, Ser: 0.78 mg/dL (ref 0.50–1.10)
GFR calc Af Amer: 88 mL/min — ABNORMAL LOW (ref >90)
GFR, EST NON AFRICAN AMERICAN: 76 mL/min — AB (ref >90)
Glucose, Bld: 246 mg/dL — ABNORMAL HIGH (ref 70–99)
POTASSIUM: 4.5 meq/L (ref 3.7–5.3)
Sodium: 141 mEq/L (ref 137–147)

## 2014-06-08 LAB — PRO B NATRIURETIC PEPTIDE: PRO B NATRI PEPTIDE: 188.3 pg/mL (ref 0–450)

## 2014-06-08 MED ORDER — ALBUTEROL SULFATE (2.5 MG/3ML) 0.083% IN NEBU
5.0000 mg | INHALATION_SOLUTION | Freq: Once | RESPIRATORY_TRACT | Status: AC
  Administered 2014-06-08: 5 mg via RESPIRATORY_TRACT
  Filled 2014-06-08: qty 6

## 2014-06-08 MED ORDER — MOXIFLOXACIN HCL 400 MG PO TABS: 400.0000 mg | ORAL_TABLET | Freq: Every day | ORAL | Status: DC

## 2014-06-08 MED ORDER — PREDNISONE 50 MG PO TABS: 50.0000 mg | ORAL_TABLET | Freq: Every day | ORAL | Status: DC

## 2014-06-08 MED ORDER — IPRATROPIUM BROMIDE 0.02 % IN SOLN
0.5000 mg | Freq: Once | RESPIRATORY_TRACT | Status: AC
  Administered 2014-06-08: 0.5 mg via RESPIRATORY_TRACT
  Filled 2014-06-08: qty 2.5

## 2014-06-08 MED ORDER — METHYLPREDNISOLONE SODIUM SUCC 125 MG IJ SOLR
125.0000 mg | Freq: Once | INTRAMUSCULAR | Status: AC
  Administered 2014-06-08: 125 mg via INTRAVENOUS
  Filled 2014-06-08: qty 2

## 2014-06-08 NOTE — Progress Notes (Signed)
CSW met with the patient at bedside to complete this assessment.  Patient was sleep but easily aroused, oriented x3, calm, and cooperative.  Patient reports that her family is aware that she is here in the ED.  The patient reports that her son is her POA and once she is medically clear she will returned to [facility].        Chesley Noon, MSW, [place], 06/08/2014 Evening Clinical Social Worker [phone]

## 2014-06-08 NOTE — Discharge Instructions (Signed)
Return to the ED with any concerns including difficulty breathing despite using albuterol every 4 hours, chest pain, fainting, vomiting and not able to keep down liquids or medications, decreased level of alertness/lethargy, or any other alarming symptoms

## 2014-06-08 NOTE — ED Notes (Signed)
Patient transported to X-ray 

## 2014-06-08 NOTE — ED Notes (Signed)
Pt from [facility] assisted living. Reports cough since February, was in hospital for 6 weeks at that time. Reports productive cough with yellow mucus. Nursing home sent pt here for evaluation due to 02 sats in 80s on RA. Hx of COPD and lung mass. Pt alert and oriented. Denies SOB, but is not normally on O2 but was on temporarily when she was first diagnosed.

## 2014-06-08 NOTE — ED Notes (Signed)
Bed: HF29 Expected date:  Expected time:  Means of arrival:  Comments: EMS-cough

## 2014-06-08 NOTE — ED Provider Notes (Signed)
CSN: [id number]     Arrival date & time 06/08/14  1528 History   First MD Initiated Contact with Patient 06/08/14 1529     Chief Complaint  Patient presents with  . Cough     (Consider location/radiation/quality/duration/timing/severity/associated sxs/prior Treatment) HPI Pt presenting with c/o chronic cough.  Pt states that she has had ongoing cough since late march when she was admitted for COPD/viral infection.  Today she states she was told her oxygen level was low at her assisted living.  She states she does not feel worse than her baseline.  Denies chest pain.  No leg swelling.  No change in sputum.  No fever/chills.  She does have dyspnea on exertion which is her baseline.  She states she uses [place] 2L 24 hours a day.  There are no other associated systemic symptoms, there are no other alleviating or modifying factors.   Past Medical History  Diagnosis Date  . Allergic rhinitis, cause unspecified   . Unspecified essential hypertension   . Cor pulmonale   . COPD (chronic obstructive pulmonary disease)   . Type II or unspecified type diabetes mellitus without mention of complication, not stated as uncontrolled   . Osteoporosis   . Hashimoto's disease   . Peripheral vascular disease   . Hyperlipidemia   . Depression   . Urinary frequency   . Other atopic dermatitis and related conditions   . Abnormal involuntary movements(781.0)   . Hyposmolality and/or hyponatremia   . Peripheral vascular disease, unspecified   . Lung mass   . Insomnia   . History of radiation therapy 08/08/2013, 08/10/2013, 08/16/2013    SBRT 54 Gy to left upper lobe pulmonary nodule   Past Surgical History  Procedure Laterality Date  . Total abdominal hysterectomy    . Cataracts    . Rotator cuff repair    . Mastectomy Right 1952  . Eye surgery    . Radiation implant, female Left Aug. 2014    Left Breast  3 Tx's   Family History  Problem Relation Age of Onset  . Cancer Mother     lung   . Lupus  Father   . Heart disease Sister    History  Substance Use Topics  . Smoking status: Former Smoker -- 2.00 packs/day for 53 years  . Smokeless tobacco: Never Used  . Alcohol Use: No   OB History   Grav Para Term Preterm Abortions TAB SAB Ect Mult Living                 Review of Systems ROS reviewed and all otherwise negative except for mentioned in HPI    Allergies  Celecoxib; Codeine; Metronidazole; Pregabalin; Propranolol hcl; Rofecoxib; Iodine; and Pregabalin  Home Medications   Prior to Admission medications   Medication Sig Start Date End Date Taking? Authorizing Provider  albuterol (PROVENTIL HFA;VENTOLIN HFA) 108 (90 BASE) MCG/ACT inhaler Inhale 2 puffs into the lungs every 4 (four) hours as needed for wheezing or shortness of breath. 11/18/13  Yes Deneise Lever, MD  albuterol (PROVENTIL) (2.5 MG/3ML) 0.083% nebulizer solution Take 2.5 mg by nebulization every 6 (six) hours as needed for wheezing or shortness of breath.   Yes Historical Provider, MD  citalopram (CELEXA) 40 MG tablet Take 40 mg by mouth daily.   Yes Historical Provider, MD  clonazePAM (KLONOPIN) 0.5 MG tablet Take 0.25-0.5 mg by mouth every morning. 0.25mg  in the am 0.5mg  in the PM 03/01/14  Yes Pricilla Larsson, NP  clopidogrel (PLAVIX) 75 MG tablet Take 75 mg by mouth daily with breakfast.   Yes Historical Provider, MD  ferrous sulfate 325 (65 FE) MG tablet Take 325 mg by mouth daily.   Yes Historical Provider, MD  Fluticasone-Salmeterol (ADVAIR) 250-50 MCG/DOSE AEPB Inhale 1 puff into the lungs 2 (two) times daily.   Yes Historical Provider, MD  furosemide (LASIX) 40 MG tablet Take 40 mg by mouth daily.   Yes Historical Provider, MD  insulin aspart (NOVOLOG) 100 UNIT/ML injection Inject 5 Units into the skin 3 (three) times daily before meals.    Yes Historical Provider, MD  insulin glargine (LANTUS) 100 UNIT/ML injection Inject 10 Units into the skin at bedtime.     Yes Historical Provider, MD   ipratropium (ATROVENT) 0.02 % nebulizer solution Take 0.5 mg by nebulization every 4 (four) hours as needed for wheezing or shortness of breath.   Yes Historical Provider, MD  lisinopril (PRINIVIL,ZESTRIL) 5 MG tablet Take 5 mg by mouth daily. HOLD if systolic blood pressure is less than 100   Yes Historical Provider, MD  ondansetron (ZOFRAN ODT) 4 MG disintegrating tablet Take 1 tablet (4 mg total) by mouth every 8 (eight) hours as needed for nausea. 07/15/13  Yes Carmin Muskrat, MD  tiotropium (SPIRIVA) 18 MCG inhalation capsule Place 18 mcg into inhaler and inhale daily.   Yes Historical Provider, MD  traMADol (ULTRAM) 50 MG tablet Take 100 mg by mouth at bedtime. 03/15/14  Yes Pricilla Larsson, NP  Vitamin D, Ergocalciferol, (DRISDOL) 50000 UNITS CAPS capsule Take 50,000 Units by mouth every 7 (seven) days.   Yes Historical Provider, MD  moxifloxacin (AVELOX) 400 MG tablet Take 1 tablet (400 mg total) by mouth daily at 8 pm. 06/08/14   Threasa Beards, MD  predniSONE (DELTASONE) 50 MG tablet Take 1 tablet (50 mg total) by mouth daily. 06/08/14   Threasa Beards, MD   BP 113/63  Pulse 89  Temp(Src) 97.8 F (36.6 C) (Oral)  Resp 20  SpO2 94% Vitals reviewed Physical Exam Physical Examination: General appearance - alert, well appearing, and in no distress Mental status - alert, oriented to person, place, and time Eyes - no conjunctival injection, no scleral icterus Mouth - mucous membranes moist, pharynx normal without lesions Chest -  Mild bilateral expiratory wheezes, rales or rhonchi, symmetric air entry, no increased respiratory effort Heart - normal rate, regular rhythm, normal S1, S2, no murmurs, rubs, clicks or gallops Abdomen - soft, nontender, nondistended, no masses or organomegaly Extremities - peripheral pulses normal, no pedal edema, no clubbing or cyanosis Skin - normal coloration and turgor, no rashes  ED Course  Procedures (including critical care time) Labs Review Labs  Reviewed  BASIC METABOLIC PANEL - Abnormal; Notable for the following:    Glucose, Bld 246 (*)    GFR calc non Af Amer 76 (*)    GFR calc Af Amer 88 (*)    All other components within normal limits  CBC  PRO B NATRIURETIC PEPTIDE    Imaging Review Dg Chest 2 View (if Patient Has Fever And/or Copd)  06/08/2014   CLINICAL DATA:  Cough and congestion  EXAM: CHEST  2 VIEW  COMPARISON:  02/24/2014  FINDINGS: Cardiac shadow is stable. Diffuse interstitial changes are identified throughout both lungs. No focal infiltrate or sizable effusion is seen. No acute bony abnormality is noted.  IMPRESSION: Chronic changes without acute abnormality.   Electronically Signed   By: Linus Mako.D.  On: 06/08/2014 16:08     EKG Interpretation   Date/Time:  Thursday June 08 2014 16:19:52 EDT Ventricular Rate:  81 PR Interval:  147 QRS Duration: 76 QT Interval:  401 QTC Calculation: 465 R Axis:   82 Text Interpretation:  Sinus rhythm Borderline right axis deviation  Anteroseptal infarct, old Minimal ST depression, inferior leads Since  previous tracing rate normalized, ST changes appear chronic Confirmed by  Canary Brim  MD, MARTHA [phone]) on 06/08/2014 7:03:16 PM      MDM   Final diagnoses:  COPD exacerbation    Pt presenting continued cough over the past several months.  She is using her chronic 2L O2- very mild wheezing on exam, no respiratory distress or increased effort.  Labs reassuring, pt treated with solumedrol, nebs in the ED with some improvement in wheezing..   Pt discharged back to her nursing facility to continue course of steroids and moxifloxacin.  Discharged with strict return precautions.  Pt agreeable with plan.    Threasa Beards, MD 06/08/14 [phone]

## 2014-06-08 NOTE — ED Notes (Signed)
Jennifer Duran from [facility] left number [phone] for update on disposition.

## 2014-06-09 ENCOUNTER — Ambulatory Visit: Payer: Medicare HMO | Admitting: Internal Medicine

## 2014-06-09 MED ORDER — MOXIFLOXACIN HCL 400 MG PO TABS: 400.0000 mg | ORAL_TABLET | Freq: Every day | ORAL | Status: DC

## 2014-06-09 MED ORDER — LEVOFLOXACIN 750 MG PO TABS: 750.0000 mg | ORAL_TABLET | Freq: Every day | ORAL | Status: DC

## 2014-06-09 NOTE — Progress Notes (Signed)
  CARE MANAGEMENT ED NOTE 06/09/2014  Patient:  CHEA, MALAN   Account Number:  [id number]  Date Initiated:  06/09/2014  Documentation initiated by:  Edwyna Shell  Subjective/Objective Assessment:   80 yo female treated in the ED for chronic cough     Subjective/Objective Assessment Detail:     Action/Plan:   New prescription faxed to [facility]   Action/Plan Detail:   Anticipated DC Date:       Status Recommendation to Physician:   Result of Recommendation:  Agreed    [place]  Other    Choice offered to / List presented to:            Status of service:    ED Comments:   ED Comments Detail:  This Cm received a phone call from pharmacist Cherly Beach at [facility] [phone]), stating that the patient was discharged with a prescription for Avalox which is not covered under her insurance. This CM spoke with Margarita Mail PA and discussed prescribed antibiotic and possible change for affordability. Received new prescription which this CM then communicated to [facility] and faxed new prescription to [phone]. No further CM needs at this time.

## 2014-08-08 ENCOUNTER — Ambulatory Visit (HOSPITAL_COMMUNITY)
Admission: RE | Admit: 2014-08-08 | Discharge: 2014-08-08 | Disposition: A | Discharge status: Home / Self Care | Payer: Medicare HMO | Source: Ambulatory Visit | Attending: Radiation Oncology | Admitting: Radiation Oncology

## 2014-08-08 DIAGNOSIS — J438 Other emphysema: Secondary | ICD-10-CM | POA: Diagnosis not present

## 2014-08-08 DIAGNOSIS — R918 Other nonspecific abnormal finding of lung field: Secondary | ICD-10-CM

## 2014-08-08 DIAGNOSIS — R0602 Shortness of breath: Secondary | ICD-10-CM | POA: Diagnosis not present

## 2014-08-08 DIAGNOSIS — I700 Atherosclerosis of aorta: Secondary | ICD-10-CM | POA: Diagnosis not present

## 2014-08-08 DIAGNOSIS — R05 Cough: Secondary | ICD-10-CM | POA: Insufficient documentation

## 2014-08-08 DIAGNOSIS — Z853 Personal history of malignant neoplasm of breast: Secondary | ICD-10-CM | POA: Insufficient documentation

## 2014-08-08 DIAGNOSIS — J984 Other disorders of lung: Secondary | ICD-10-CM | POA: Insufficient documentation

## 2014-08-08 DIAGNOSIS — R911 Solitary pulmonary nodule: Secondary | ICD-10-CM | POA: Diagnosis not present

## 2014-08-08 DIAGNOSIS — R059 Cough, unspecified: Secondary | ICD-10-CM | POA: Diagnosis not present

## 2014-08-10 ENCOUNTER — Ambulatory Visit: Payer: Medicare HMO | Admitting: Radiation Oncology

## 2014-08-31 ENCOUNTER — Encounter: Payer: Self-pay | Admitting: Radiation Oncology

## 2014-08-31 ENCOUNTER — Ambulatory Visit
Admission: RE | Admit: 2014-08-31 | Discharge: 2014-08-31 | Disposition: A | Discharge status: Home / Self Care | Payer: Medicare HMO | Source: Ambulatory Visit | Attending: Radiation Oncology | Admitting: Radiation Oncology

## 2014-08-31 VITALS — BP 159/42 | HR 96 | Temp 97.7°F | Resp 28 | Ht 62.5 in | Wt 117.7 lb

## 2014-08-31 DIAGNOSIS — R918 Other nonspecific abnormal finding of lung field: Secondary | ICD-10-CM

## 2014-08-31 NOTE — Progress Notes (Signed)
Radiation Oncology         [phone] ________________________________  Name: Jennifer Duran MRN: [id number]  Date: 08/31/2014  DOB: [birth year]  Follow-Up Visit Note  CC: REED, TIFFANY, DO  Young, Clinton D, MD  Diagnosis:   PET positive pulmonary nodule presenting in the left upper lobe, clinical stage I   Interval Since Last Radiation:  12  months, the patient completed SBRT  Narrative:  The patient returns today for routine follow-up.  She seems reasonably stable at this time. She continues to be oxygen-dependent is on 2 L. She denies any pain within the chest or hemoptysis. She resides in [facility] assisted living. The patient underwent a chest CT scan last month which showed the left upper lobe nodule to be stable without any progression or any new problems within the chest.                              ALLERGIES:  is allergic to celecoxib; codeine; metronidazole; pregabalin; propranolol hcl; rofecoxib; iodine; and pregabalin.  Meds: Current Outpatient Prescriptions  Medication Sig Dispense Refill  . albuterol (PROVENTIL HFA;VENTOLIN HFA) 108 (90 BASE) MCG/ACT inhaler Inhale 2 puffs into the lungs every 4 (four) hours as needed for wheezing or shortness of breath.  1 Inhaler  prn  . albuterol (PROVENTIL) (2.5 MG/3ML) 0.083% nebulizer solution Take 2.5 mg by nebulization every 6 (six) hours as needed for wheezing or shortness of breath.      . Cholecalciferol (VITAMIN D-3) 1000 UNITS CAPS Take 1 capsule by mouth daily.      . citalopram (CELEXA) 40 MG tablet Take 40 mg by mouth daily.      . clonazePAM (KLONOPIN) 0.5 MG tablet Take 0.25-0.5 mg by mouth 2 (two) times daily. 0.25mg  in the am 0.5mg  in the PM      . clopidogrel (PLAVIX) 75 MG tablet Take 75 mg by mouth daily with breakfast.      . ferrous sulfate 325 (65 FE) MG tablet Take 325 mg by mouth daily.      . Fluticasone-Salmeterol (ADVAIR) 250-50 MCG/DOSE AEPB Inhale 1 puff into the lungs 2 (two) times daily.        . furosemide (LASIX) 40 MG tablet Take 40 mg by mouth daily.      . insulin aspart (NOVOLOG) 100 UNIT/ML injection Inject 5 Units into the skin 3 (three) times daily before meals.       . insulin glargine (LANTUS) 100 UNIT/ML injection Inject 10 Units into the skin at bedtime.        Marland Kitchen ipratropium (ATROVENT) 0.02 % nebulizer solution Take 0.5 mg by nebulization every 4 (four) hours as needed for wheezing or shortness of breath.      . lisinopril (PRINIVIL,ZESTRIL) 5 MG tablet Take 5 mg by mouth daily. HOLD if systolic blood pressure is less than 100      . ondansetron (ZOFRAN ODT) 4 MG disintegrating tablet Take 1 tablet (4 mg total) by mouth every 8 (eight) hours as needed for nausea.  10 tablet  0  . tiotropium (SPIRIVA) 18 MCG inhalation capsule Place 18 mcg into inhaler and inhale daily.      . traMADol (ULTRAM) 50 MG tablet Take 100 mg by mouth at bedtime.      Marland Kitchen levofloxacin (LEVAQUIN) 750 MG tablet Take 1 tablet (750 mg total) by mouth daily. X 7 days  7 tablet  0  . moxifloxacin (AVELOX)  400 MG tablet Take 1 tablet (400 mg total) by mouth daily at 8 pm.  10 tablet  0  . predniSONE (DELTASONE) 50 MG tablet Take 1 tablet (50 mg total) by mouth daily.  5 tablet  0  . Vitamin D, Ergocalciferol, (DRISDOL) 50000 UNITS CAPS capsule Take 50,000 Units by mouth every 7 (seven) days.       No current facility-administered medications for this encounter.    Physical Findings: The patient is in no acute distress. Patient is alert and oriented.  height is 5' 2.5" (1.588 m) and weight is 117 lb 11.2 oz (53.388 kg). Her oral temperature is 97.7 F (36.5 C). Her blood pressure is 159/42 and her pulse is 96. Her respiration is 28 and oxygen saturation is 91%. .   Breath sounds the general are distant. The heart has regular rhythm and rate.  no wheezing noted  Lab Findings: Lab Results  Component Value Date   WBC 8.8 06/08/2014   HGB 12.9 06/08/2014   HCT 41.0 06/08/2014   MCV 89.3 06/08/2014   PLT  316 06/08/2014      Radiographic Findings: Ct Chest Wo Contrast  08/08/2014   CLINICAL DATA:  Follow up pulmonary nodule. History of breast cancer status post SBRT 1 year ago. Shortness of breath and cough.  EXAM: CT CHEST WITHOUT CONTRAST  TECHNIQUE: Multidetector CT imaging of the chest was performed following the standard protocol without IV contrast.  COMPARISON:  Chest CTs 12/02/2012, 05/19/2013 and 12/08/2013. PET-CT 06/20/2013.  FINDINGS: Mediastinum: There are no enlarged mediastinal, hilar or axillary lymph nodes. The thyroid gland and esophagus appear normal. There are probable adherent secretions along the left wall of the trachea (image number 5). The heart size is normal. There is diffuse atherosclerosis of the aorta, great vessels and coronary arteries.  Lungs/Pleura: There is no pleural or pericardial effusion.Again demonstrated are severe changes of centrilobular emphysema with architectural distortion and scattered subpleural reticulation. The residual left upper lobe spiculated nodule is less well-defined due to adjacent treatment changes, but grossly stable in size. There is no chest wall extension. The 6 mm right lower lobe nodular density on image number 30 is stable. There is mildly increased linear scarring in the right lung. No new or enlarging pulmonary nodules are identified.  Upper abdomen:  Unremarkable.  There is no adrenal mass.  Musculoskeletal/Chest wall: No chest wall lesion or suspicious osseous finding demonstrated.  IMPRESSION: 1. Little change in appearance of residual spiculated left upper lobe mass status post radiation therapy. 2. No evidence of metastatic disease. 3. Severe emphysema with scattered pulmonary parenchymal scarring.   Electronically Signed   By: Camie Patience M.D.   On: 08/08/2014 13:00    Impression:  Clinically stable  Plan:  Routine followup in 6 months. Patient will undergo repeat chest CT scan at that time. She will followup with pulmonary  medicine later this month  ____________________________________ Blair Promise, MD

## 2014-08-31 NOTE — Progress Notes (Signed)
Jennifer Duran here for follow up after treatment to her left upper lobe.  She denies pain.  She reports an occasional cough with yellow sputum.  She reports shortness of breath.  Her oxygen saturation on arrival was 87% on 2l and went up to 91 with deep breathing.  She denies fatigue.  She is wondering about her CT scan from 08/08/14.

## 2014-09-01 ENCOUNTER — Telehealth: Payer: Self-pay | Admitting: *Deleted

## 2014-09-01 NOTE — Telephone Encounter (Signed)
CALLED PATIENT TO INFORM OF CT FOR 03-05-15 - ARRIVAL TIME 12:15 PM NPO 4 HRS. PRIOR TO THE TEST AND HER FU VISIT WITH DR. KINARD ON 03-05-15 @ 3:30 PM, LVM FOR A RETURN CALL, NOTIFIED ROSE  @ [phone], PER DR. KINARD REQUEST

## 2014-09-21 ENCOUNTER — Ambulatory Visit (INDEPENDENT_AMBULATORY_CARE_PROVIDER_SITE_OTHER): Payer: Medicare HMO | Admitting: Internal Medicine

## 2014-09-21 ENCOUNTER — Encounter: Payer: Self-pay | Admitting: Internal Medicine

## 2014-09-21 VITALS — BP 112/68 | HR 80 | Ht 63.0 in | Wt 118.4 lb

## 2014-09-21 DIAGNOSIS — R222 Localized swelling, mass and lump, trunk: Secondary | ICD-10-CM

## 2014-09-21 DIAGNOSIS — J438 Other emphysema: Secondary | ICD-10-CM

## 2014-09-21 DIAGNOSIS — J439 Emphysema, unspecified: Secondary | ICD-10-CM

## 2014-09-21 DIAGNOSIS — Z23 Encounter for immunization: Secondary | ICD-10-CM

## 2014-09-21 DIAGNOSIS — R918 Other nonspecific abnormal finding of lung field: Secondary | ICD-10-CM

## 2014-09-21 NOTE — Progress Notes (Signed)
Patient ID: BEVIN DAS, female    DOB: 12/08/1929, 80 y.o.   MRN: [id number]  HPI 03/12/16- COPD complicated by DM.   Husband and daughter here Last here December 20, 2010- reviewed several labs that visit.  She feels she is doing pretty well. No acute problems since last here. Has stayed in a lot to avoid the Spring pollen and the recent poor air quality and heat. Remains on continuous oxygen at 2 L/[employer]. Diabetic neuropathic paresthesias can disturb sleep some. She and family wanted to discuss portable concentrator or liquid oxygen as alternative to her portable tanks.   12/26/11- 55 yoF  Former smoker, followed for COPD complicated by DM, HBP. Here w/ husband. Had flu shot. Gradually aware of it easier dyspnea with exertion over the last 6 months. No dramatic events and no recent colds. She does not cough. Has noted a little palpitation at times without chest pain or syncope. She is now using a portable oxygen concentrator-battery lasts for hours-set on 3 L.  06/24/12- 82 yoF  Former smoker, followed for COPD complicated by DM, HBP. Here w/ husband. Doing well overall; unless heat then has to stay in doors as much as possible. She very much likes her portable oxygen concentrator 2 L per minute. When humidity is high she switches to 3 L. Oxygen flow is causing dry epistaxis COPD assessment test (CAT) score 14/40 Finances are tight for medication cost.  12/24/12- 76 yoF  Former smoker, followed for COPD complicated by DM, HBP.      Widowed 6 month follow up.  O2 sat 86% on 2 lpm pulsed upon arrival to exam room.  Reports breathing has gradually worsened since the summer.  Having increased DOE and wheezing at times.  No chest tightness, chest pain, or cough at this time. Breathing got worse when her husband died. She seems to recognize components of deconditioning and grief/depression. Her son and daughter are able to look in and help some. I asked her to consider no longer living  alone. CT chest 12/02/12- IMPRESSION:  1. The area of concern at the right lung base probably represents  superimposed vascular and osseous shadows.  2. However, there are If the patient is at low risk for  bronchogenic carcinoma, follow-up chest CT at 12 months is  recommended. This recommendation follows the consensus statement:  Guidelines for Management of Small Pulmonary Nodules Detected on CT  Scans: A Statement from the [employer] as published in  Radiology 2005; 237:395-400.  3. Severe emphysema.  4. Prominent atherosclerosis.  Original Report Authenticated By: Van Clines, M.D.   05/25/13- 73 yoF  Former smoker, followed for COPD complicated by DM, HBP.      Widowed FOLLOWS OHY:[id number] up visit per CY-seen in ER-CT chest showed nodules. Daughter here is HCPOA Using 3L O2 sleep and exertion, 2L if quiet. Dry cough. No nodes, chest pain or blood. ER 5/24 - woke w/ palpitations.  CT chest changed so she was told to come in: CT chest 05/21/13 IMPRESSION:  Severe emphysematous changes consistent with COPD.  2.6 x 1.4 x 2.5 cm diameter spiculated density in left upper lobe  significantly increased in size since previous study consistent  with a pulmonary neoplasm.  Original Report Authenticated By: Lavonia Dana, M.D.  07/18/13- 72 yoF  Former smoker, followed for COPD complicated by DM, HBP.      Widowed Daughter here. At Thoracic Conference 06/16/13 we reviewed images, and Dr Donnamae Jude Onc indicated he would  probably be willing to offer SBRT XRT w/o bx if PET was consistent w/ CA. I called her about this, ordered  Biaxin, and sent Quant TB Assay ( NEG 06/17/13) to eval for possible infection.  ER for weakness 4 days ago. Noted potassium and magnesium low. They gave prednisone. PET 06/22/13- ( after 1 week biaxin) IMPRESSION:  1. Hypermetabolic left upper lobe pulmonary nodule is concerning  for bronchogenic carcinoma. If indeed this is carcinoma, FDG PET  staging T1a  N0 M0  2. No evidence metastasis.  3. Emphysematous change in the lungs  Original Report Authenticated By: Suzy Bouchard, M.D. CXR 07/15/13 IMPRESSION:  Emphysema without acute cardiopulmonary findings.  Left upper lobe nodule.  Original Report Authenticated By: Misty Stanley, M.D.   11/18/13- 44 yoF  Former smoker, followed for COPD, LUL nodule assumed CA/ SBRT,  complicated by DM, HBP.      Widowed Daughter here. FOLLOWS FOR: denies any wheezing, cough, congestion, or SOB unless over exerting herself. Completed SBRT/ Dr Sondra Come presumptively to LUL nodule. O2 2L Has had radiation treatment for lung nodule empiricallly managed as bronchogenic carcinoma.  Moving to Assisted Living in January. O2 2L continuous/ [employer]. Denies pain and feels stable.  03/21/14- 25 yoF  Former smoker, followed for COPD, LUL nodule assumed CA/ SBRT/RadOnc,  complicated by DM, HBP.      Widowed FOLLOWS FOR: Pt states her breathing started getting worse since last visit-more so with activity; then had the flu after getting the flu shot. Was at [facility] for COPD in Feb 2015. Nursing home for rehabilitation after hospitalized for acute exacerbation of COPD. Now using oxygen 2-3 L for exertion. Denies having any cough or chest pain at all. CT chest 12/08/13 IMPRESSION:  1. No acute cardiopulmonary abnormalities.  2. Decrease in size of spiculated nodular density in the left upper  lobe compatible with response to therapy.  3. Stable nodule in the right lung.  4. Coronary artery calcifications.  Electronically Signed  By: Kerby Moors M.D.  On: 12/08/2013 16:05 CXR 02/23/14 IMPRESSION:  1. Resolved left basilar atelectasis.  2. Marked lung hyperexpansion without definite acute cardiopulmonary  disease on this AP portable examination.  Electronically Signed  By: Sandi Mariscal M.D.  On: 02/24/2014 07:22  09/21/14- 37 yoF  Former smoker, followed for COPD, LUL nodule assumed CA/ SBRT/RadOnc,  complicated by DM, HBP.       Widowed             Aide here FOLLOWS FOR: Pt feels her breathing is getting better; not gasping for air anymore. Wears O2 2-3L all the time. Now Assisted Living [facility] [employer] portable concentrator O2 2-4L continuous. CT chest 08/08/14 IMPRESSION:  1. Little change in appearance of residual spiculated left upper  lobe mass status post radiation therapy.  2. No evidence of metastatic disease.  3. Severe emphysema with scattered pulmonary parenchymal scarring.  Electronically Signed  By: Camie Patience M.D.  On: 08/08/2014 13:00  Review of Systems- see HPI Constitutional:   No-   weight loss, night sweats, fevers, chills, fatigue, lassitude. HEENT:   No-  headaches, difficulty swallowing, tooth/dental problems, sore throat,       No-  sneezing, itching, ear ache, nasal congestion, post nasal drip,  CV:  No-   chest pain, orthopnea, PND, swelling in lower extremities, anasarca, dizziness, +palpitations Resp: +  shortness of breath with exertion, not at rest.              No-  productive cough,  No- non-productive cough,  No- coughing up of blood.              No-   change in color of mucus.  No- wheezing.   Skin: No-   rash or lesions. GI:  No-   heartburn, indigestion, abdominal pain, nausea, vomiting, GU:  MS:  No-   joint pain or swelling.   Neuro-     nothing unusual Psych:  No- change in mood or affect. + depression or anxiety.  No memory loss.  Objective:   General- Alert, Oriented, Affect+depressed, crying intermittently, Distress- none acute,              2l / 95% by her portable concentrator, Frail, elderly, weak, wheelchair,  Skin- rash-none, lesions- none, excoriation- none.  Lymphadenopathy- none Head- atraumatic            Eyes- Gross vision intact, PERRLA, conjunctivae clear secretions            Ears- Hearing, canals-normal            Nose- Clear, no-Septal dev, mucus, polyps, erosion, perforation             Throat- Mallampati II , mucosa clear , drainage-  none, tonsils- atrophic Neck- flexible , trachea midline, no stridor , thyroid nl, carotid no bruit Chest - symmetrical excursion , unlabored           Heart/CV- RRR , no murmur , no gallop  , no rub, nl s1 s2                           - JVD- none , edema- none, stasis changes- none, varices- none           Lung- +clear to P&A- very distant, wheeze- none, cough- none , dullness-none, rub- none           Chest wall-  Abd-  Br/ Gen/ Rectal- Not done, not indicated Extrem- cyanosis- none, clubbing, none, atrophy- none, wheelchair Neuro- +Resting tremor

## 2014-09-21 NOTE — Patient Instructions (Signed)
We can continue current meds  We can continue current O2 2-4L/ min as needed for comfort/ [employer]  Flu vax  Please call as needed

## 2014-09-22 NOTE — Assessment & Plan Note (Signed)
completed empiric XRT. Being followed by Rad Onc

## 2014-09-22 NOTE — Assessment & Plan Note (Signed)
Severe COPD, better controlled.

## 2015-01-03 ENCOUNTER — Telehealth: Payer: Self-pay | Admitting: *Deleted

## 2015-01-03 NOTE — Telephone Encounter (Signed)
Received phone call from [facility] and patient wants to come another day for CT and fu, patient's test has been rescheduled for 03-14-15 @ [facility] and her fu with Dr. Sondra Come has been rescheduled for 03-15-15, spoke with [facility] and they are aware of all appt. Changes and they are good with them.

## 2015-01-09 IMAGING — CR DG CHEST 1V PORT
1 series · 1 of 1 positions shown · non-contrast
Comparison: DG CHEST 1V PORT dated 02/23/2014;

CLINICAL DATA: Dyspnea

EXAM:
PORTABLE CHEST - 1 VIEW

[AP]
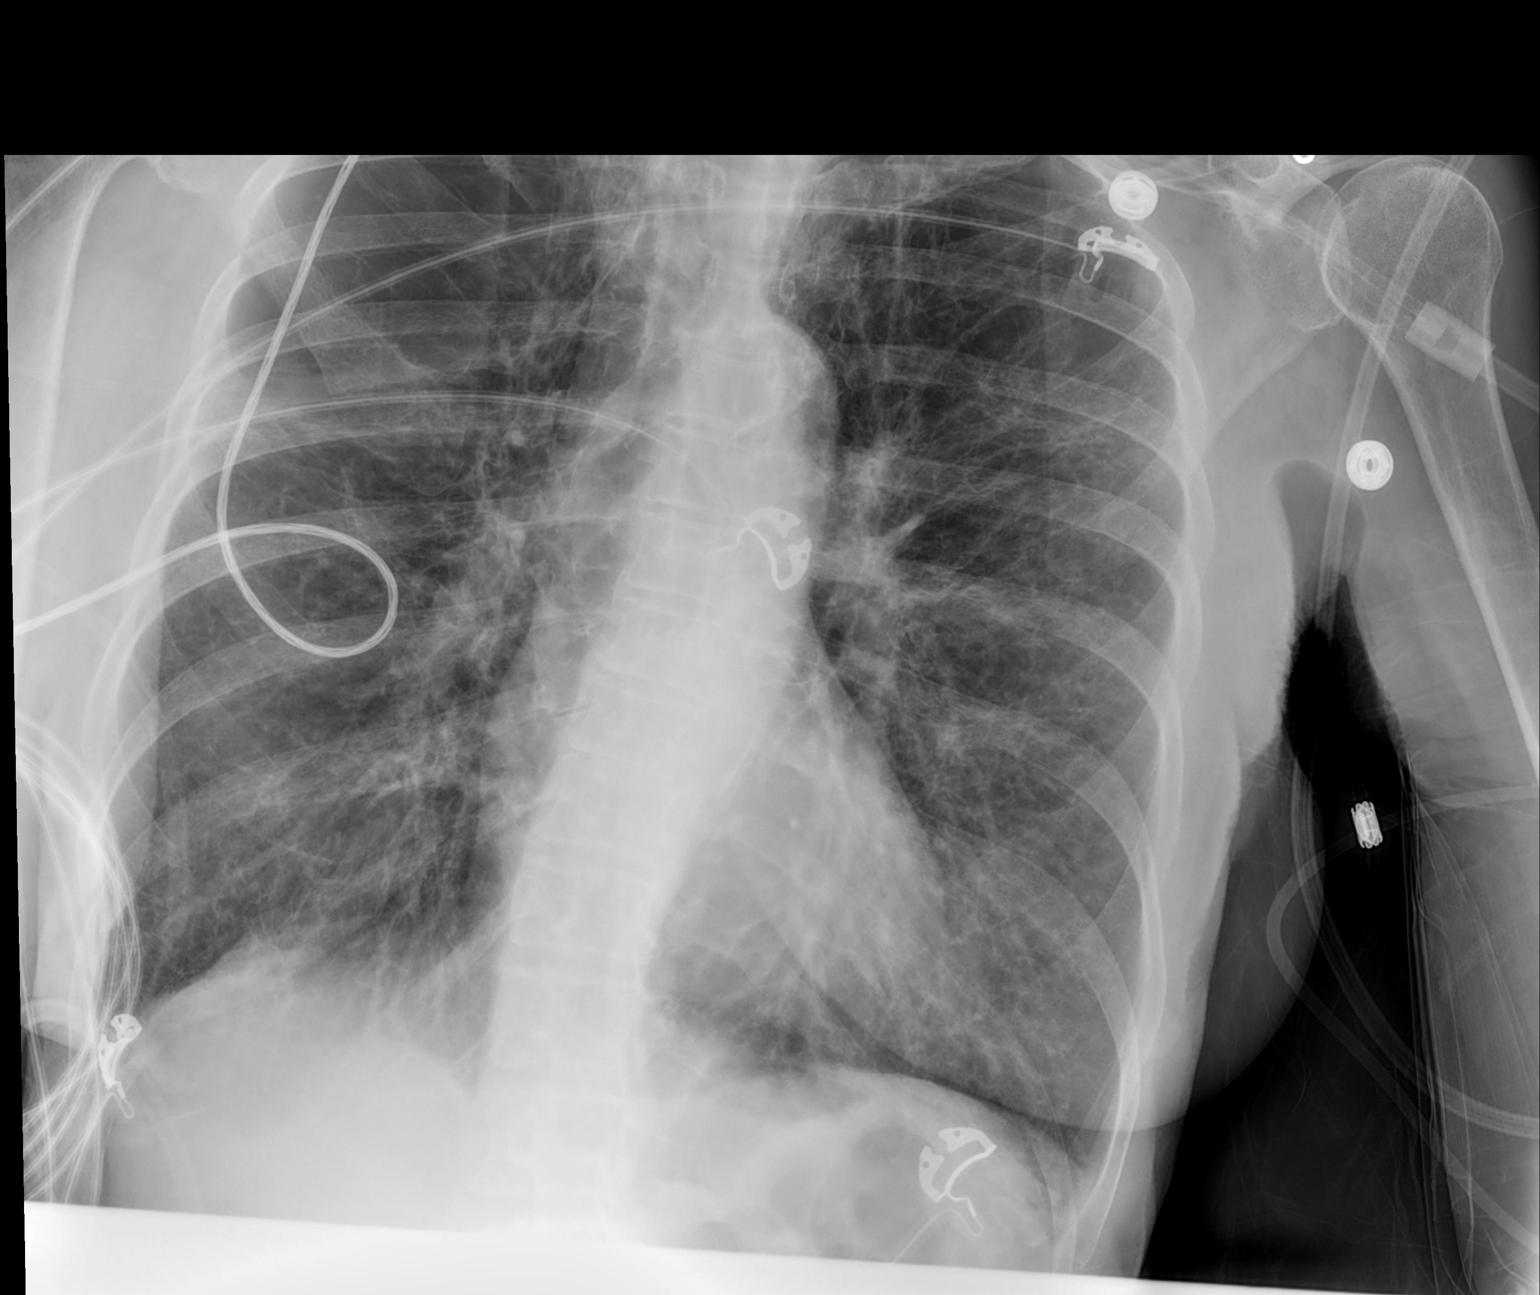

[1 of 1 positions shown; findings below may reference images not displayed]

DG CHEST 1V PORT
dated 02/22/2014; DG CHEST 2 VIEW dated 11/18/2013; CT CHEST W/O CM
dated 12/08/2013
FINDINGS: Grossly unchanged cardiac silhouette and mediastinal contours with
mild tortuosity of the thoracic aorta. The lungs are hyperexpanded
with flattening of bilateral diaphragms and mild diffuse slightly
nodular thickening of the pulmonary interstitium. There is
asymmetric thinning of the pulmonary parenchyma within the bilateral
lung apices, right greater than left. Improved aeration of the left
lower lung. No new focal airspace opacities. No pleural effusion or
pneumothorax. No definite evidence of edema. Grossly unchanged
bones.
IMPRESSION: 1. Resolved left basilar atelectasis.
2. Marked lung hyperexpansion without definite acute cardiopulmonary
disease on this AP portable examination.

## 2015-02-01 ENCOUNTER — Ambulatory Visit (INDEPENDENT_AMBULATORY_CARE_PROVIDER_SITE_OTHER): Payer: Medicare HMO | Admitting: Ophthalmology

## 2015-02-01 DIAGNOSIS — H35033 Hypertensive retinopathy, bilateral: Secondary | ICD-10-CM

## 2015-02-01 DIAGNOSIS — E11311 Type 2 diabetes mellitus with unspecified diabetic retinopathy with macular edema: Secondary | ICD-10-CM

## 2015-02-01 DIAGNOSIS — E11321 Type 2 diabetes mellitus with mild nonproliferative diabetic retinopathy with macular edema: Secondary | ICD-10-CM

## 2015-02-01 DIAGNOSIS — H2512 Age-related nuclear cataract, left eye: Secondary | ICD-10-CM

## 2015-02-01 DIAGNOSIS — I10 Essential (primary) hypertension: Secondary | ICD-10-CM

## 2015-02-01 DIAGNOSIS — H43813 Vitreous degeneration, bilateral: Secondary | ICD-10-CM

## 2015-02-07 ENCOUNTER — Ambulatory Visit (INDEPENDENT_AMBULATORY_CARE_PROVIDER_SITE_OTHER): Payer: Medicare HMO | Admitting: Ophthalmology

## 2015-03-05 ENCOUNTER — Ambulatory Visit: Payer: Medicare HMO | Admitting: Radiation Oncology

## 2015-03-05 ENCOUNTER — Ambulatory Visit (HOSPITAL_COMMUNITY): Payer: Medicare HMO

## 2015-03-14 ENCOUNTER — Ambulatory Visit (HOSPITAL_COMMUNITY): Payer: Medicare HMO

## 2015-03-15 ENCOUNTER — Ambulatory Visit: Payer: Medicare HMO | Admitting: Radiation Oncology

## 2015-03-22 ENCOUNTER — Ambulatory Visit (INDEPENDENT_AMBULATORY_CARE_PROVIDER_SITE_OTHER)
Admission: RE | Admit: 2015-03-22 | Discharge: 2015-03-22 | Disposition: A | Discharge status: Home / Self Care | Payer: Medicare HMO | Source: Ambulatory Visit | Attending: Internal Medicine | Admitting: Internal Medicine

## 2015-03-22 ENCOUNTER — Ambulatory Visit (INDEPENDENT_AMBULATORY_CARE_PROVIDER_SITE_OTHER): Payer: Medicare HMO | Admitting: Internal Medicine

## 2015-03-22 ENCOUNTER — Encounter: Payer: Self-pay | Admitting: Internal Medicine

## 2015-03-22 VITALS — BP 118/60 | HR 108 | Ht 63.0 in | Wt 125.2 lb

## 2015-03-22 DIAGNOSIS — R918 Other nonspecific abnormal finding of lung field: Secondary | ICD-10-CM

## 2015-03-22 DIAGNOSIS — R0602 Shortness of breath: Secondary | ICD-10-CM

## 2015-03-22 DIAGNOSIS — J439 Emphysema, unspecified: Secondary | ICD-10-CM

## 2015-03-22 DIAGNOSIS — J432 Centrilobular emphysema: Secondary | ICD-10-CM | POA: Diagnosis not present

## 2015-03-22 NOTE — Patient Instructions (Signed)
Order CXR   Dx COPD emphysema  We can continue present meds  Keep your follow-up appointment as planned with Dr Sondra Come in Radiation Oncology     April 28 at 11:00 AM  Please call if we can help

## 2015-03-22 NOTE — Progress Notes (Signed)
Patient ID: Jennifer Duran, female    DOB: 1929/01/13, 81 y.o.   MRN: [id number]  HPI 04/06/80- COPD complicated by DM.   Husband and daughter here Last here December 20, 2010- reviewed several labs that visit.  She feels she is doing pretty well. No acute problems since last here. Has stayed in a lot to avoid the Spring pollen and the recent poor air quality and heat. Remains on continuous oxygen at 2 L/[employer]. Diabetic neuropathic paresthesias can disturb sleep some. She and family wanted to discuss portable concentrator or liquid oxygen as alternative to her portable tanks.   12/26/11- 47 yoF  Former smoker, followed for COPD complicated by DM, HBP. Here w/ husband. Had flu shot. Gradually aware of it easier dyspnea with exertion over the last 6 months. No dramatic events and no recent colds. She does not cough. Has noted a little palpitation at times without chest pain or syncope. She is now using a portable oxygen concentrator-battery lasts for hours-set on 3 L.  06/24/12- 82 yoF  Former smoker, followed for COPD complicated by DM, HBP. Here w/ husband. Doing well overall; unless heat then has to stay in doors as much as possible. She very much likes her portable oxygen concentrator 2 L per minute. When humidity is high she switches to 3 L. Oxygen flow is causing dry epistaxis COPD assessment test (CAT) score 14/40 Finances are tight for medication cost.  12/24/12- 55 yoF  Former smoker, followed for COPD complicated by DM, HBP.      Widowed 6 month follow up.  O2 sat 86% on 2 lpm pulsed upon arrival to exam room.  Reports breathing has gradually worsened since the summer.  Having increased DOE and wheezing at times.  No chest tightness, chest pain, or cough at this time. Breathing got worse when her husband died. She seems to recognize components of deconditioning and grief/depression. Her son and daughter are able to look in and help some. I asked her to consider no longer living  alone. CT chest 12/02/12- IMPRESSION:  1. The area of concern at the right lung base probably represents  superimposed vascular and osseous shadows.  2. However, there are If the patient is at low risk for  bronchogenic carcinoma, follow-up chest CT at 12 months is  recommended. This recommendation follows the consensus statement:  Guidelines for Management of Small Pulmonary Nodules Detected on CT  Scans: A Statement from the [employer] as published in  Radiology 2005; 237:395-400.  3. Severe emphysema.  4. Prominent atherosclerosis.  Original Report Authenticated By: Van Clines, M.D.   05/25/13- 31 yoF  Former smoker, followed for COPD complicated by DM, HBP.      Widowed FOLLOWS [id number] up visit per CY-seen in ER-CT chest showed nodules. Daughter here is HCPOA Using 3L O2 sleep and exertion, 2L if quiet. Dry cough. No nodes, chest pain or blood. ER 5/24 - woke w/ palpitations.  CT chest changed so she was told to come in: CT chest 05/21/13 IMPRESSION:  Severe emphysematous changes consistent with COPD.  2.6 x 1.4 x 2.5 cm diameter spiculated density in left upper lobe  significantly increased in size since previous study consistent  with a pulmonary neoplasm.  Original Report Authenticated By: Lavonia Dana, M.D.  07/18/13- 20 yoF  Former smoker, followed for COPD complicated by DM, HBP.      Widowed Daughter here. At Thoracic Conference 06/16/13 we reviewed images, and Dr Donnamae Jude Onc indicated he would  probably be willing to offer SBRT XRT w/o bx if PET was consistent w/ CA. I called her about this, ordered  Biaxin, and sent Quant TB Assay ( NEG 06/17/13) to eval for possible infection.  ER for weakness 4 days ago. Noted potassium and magnesium low. They gave prednisone. PET 06/22/13- ( after 1 week biaxin) IMPRESSION:  1. Hypermetabolic left upper lobe pulmonary nodule is concerning  for bronchogenic carcinoma. If indeed this is carcinoma, FDG PET  staging T1a  N0 M0  2. No evidence metastasis.  3. Emphysematous change in the lungs  Original Report Authenticated By: Suzy Bouchard, M.D. CXR 07/15/13 IMPRESSION:  Emphysema without acute cardiopulmonary findings.  Left upper lobe nodule.  Original Report Authenticated By: Misty Stanley, M.D.   11/18/13- 37 yoF  Former smoker, followed for COPD, LUL nodule assumed CA/ SBRT,  complicated by DM, HBP.      Widowed Daughter here. FOLLOWS FOR: denies any wheezing, cough, congestion, or SOB unless over exerting herself. Completed SBRT/ Dr Sondra Come presumptively to LUL nodule. O2 2L Has had radiation treatment for lung nodule empiricallly managed as bronchogenic carcinoma.  Moving to Assisted Living in January. O2 2L continuous/ [employer]. Denies pain and feels stable.  03/21/14- 77 yoF  Former smoker, followed for COPD, LUL nodule assumed CA/ SBRT/RadOnc,  complicated by DM, HBP.      Widowed FOLLOWS FOR: Pt states her breathing started getting worse since last visit-more so with activity; then had the flu after getting the flu shot. Was at [facility] for COPD in Feb 2015. Nursing home for rehabilitation after hospitalized for acute exacerbation of COPD. Now using oxygen 2-3 L for exertion. Denies having any cough or chest pain at all. CT chest 12/08/13 IMPRESSION:  1. No acute cardiopulmonary abnormalities.  2. Decrease in size of spiculated nodular density in the left upper  lobe compatible with response to therapy.  3. Stable nodule in the right lung.  4. Coronary artery calcifications.  Electronically Signed  By: Kerby Moors M.D.  On: 12/08/2013 16:05 CXR 02/23/14 IMPRESSION:  1. Resolved left basilar atelectasis.  2. Marked lung hyperexpansion without definite acute cardiopulmonary  disease on this AP portable examination.  Electronically Signed  By: Sandi Mariscal M.D.  On: 02/24/2014 07:22  09/21/14- 67 yoF  Former smoker, followed for COPD, LUL nodule assumed CA/ SBRT/RadOnc,  complicated by DM, HBP.       Widowed             Aide here FOLLOWS FOR: Pt feels her breathing is getting better; not gasping for air anymore. Wears O2 2-3L all the time. Now Assisted Living [facility] [employer] portable concentrator O2 2-4L continuous. CT chest 08/08/14 IMPRESSION:  1. Little change in appearance of residual spiculated left upper  lobe mass status post radiation therapy.  2. No evidence of metastatic disease.  3. Severe emphysema with scattered pulmonary parenchymal scarring.  Electronically Signed  By: Camie Patience M.D.  On: 08/08/2014 13:00  03/22/15- 60 yoF  Former smoker, followed for COPD, LUL nodule assumed CA/ SBRT/RadOnc,  complicated by DM, HBP.      Widowed             Wears O2 2-3L all the time. Now Assisted Living [facility] FOLLOWS FOR states feeling SOB with exertion. Wears O2 all the time.  She reports doing pretty well now. Her situation is stable at her assisted living facility. No acute issues or major infections this winter. She continues radiation oncology follow-up after radiation treatment  for the lung mass consistent with cancer which was never biopsied because of her severe lung disease.  Review of Systems- see HPI Constitutional:   No-   weight loss, night sweats, fevers, chills, fatigue, lassitude. HEENT:   No-  headaches, difficulty swallowing, tooth/dental problems, sore throat,       No-  sneezing, itching, ear ache, nasal congestion, post nasal drip,  CV:  No-   chest pain, orthopnea, PND, swelling in lower extremities, anasarca, dizziness, +palpitations Resp: +  shortness of breath with exertion, not at rest.              No-   productive cough,  No- non-productive cough,  No- coughing up of blood.              No-   change in color of mucus.  No- wheezing.   Skin: No-   rash or lesions. GI:  No-   heartburn, indigestion, abdominal pain, nausea, vomiting, GU:  MS:  No-   joint pain or swelling.   Neuro-     nothing unusual Psych:  No- change in mood or affect. +  depression or anxiety.  No memory loss.  Objective:   General- Alert, Oriented, Affect normal,  Distress- none acute,               Frail, elderly, weak,  O2 Skin- rash-none, lesions- none, excoriation- none.  Lymphadenopathy- none Head- atraumatic            Eyes- Gross vision intact, PERRLA, conjunctivae clear secretions            Ears- Hearing, canals-normal            Nose- Clear, no-Septal dev, mucus, polyps, erosion, perforation             Throat- Mallampati II , mucosa clear , drainage- none, tonsils- atrophic,                          + mild hoarseness Neck- flexible , trachea midline, no stridor , thyroid nl, carotid no bruit Chest - symmetrical excursion , unlabored           Heart/CV- RRR , no murmur , no gallop  , no rub, nl s1 s2                           - JVD- none , edema- none, stasis changes- none, varices- none           Lung- +clear to P&A- very distant, wheeze- none, cough- none , dullness-none, rub- none           Chest wall-  Abd-  Br/ Gen/ Rectal- Not done, not indicated Extrem- cyanosis- none, clubbing, none, atrophy- none,  Neuro- +Resting tremor

## 2015-03-25 NOTE — Assessment & Plan Note (Signed)
Severe emphysema with chronic hypoxic respiratory failure. Physically and emotionally she seems more stable at this visit without acute needs. Plan-chest x-ray

## 2015-03-25 NOTE — Assessment & Plan Note (Signed)
She is reminded to follow-up as scheduled with Dr. Donnamae Jude Onc Plan-chest x-ray

## 2015-04-05 ENCOUNTER — Emergency Department (HOSPITAL_COMMUNITY): Payer: Medicare HMO

## 2015-04-05 ENCOUNTER — Encounter (HOSPITAL_COMMUNITY): Payer: Self-pay | Admitting: Emergency Medicine

## 2015-04-05 ENCOUNTER — Inpatient Hospital Stay (HOSPITAL_COMMUNITY)
Admission: EM | Admit: 2015-04-05 | Discharge: 2015-04-09 | DRG: 482 | Disposition: A | Discharge status: Skilled Nursing Facility | Payer: Medicare HMO | Attending: Internal Medicine | Admitting: Internal Medicine

## 2015-04-05 ENCOUNTER — Ambulatory Visit (INDEPENDENT_AMBULATORY_CARE_PROVIDER_SITE_OTHER): Payer: Medicare HMO | Admitting: Ophthalmology

## 2015-04-05 DIAGNOSIS — Z7902 Long term (current) use of antithrombotics/antiplatelets: Secondary | ICD-10-CM

## 2015-04-05 DIAGNOSIS — Z888 Allergy status to other drugs, medicaments and biological substances status: Secondary | ICD-10-CM

## 2015-04-05 DIAGNOSIS — I10 Essential (primary) hypertension: Secondary | ICD-10-CM | POA: Diagnosis present

## 2015-04-05 DIAGNOSIS — Z9849 Cataract extraction status, unspecified eye: Secondary | ICD-10-CM | POA: Diagnosis not present

## 2015-04-05 DIAGNOSIS — Z79899 Other long term (current) drug therapy: Secondary | ICD-10-CM

## 2015-04-05 DIAGNOSIS — W19XXXA Unspecified fall, initial encounter: Secondary | ICD-10-CM

## 2015-04-05 DIAGNOSIS — Z87891 Personal history of nicotine dependence: Secondary | ICD-10-CM | POA: Diagnosis not present

## 2015-04-05 DIAGNOSIS — E119 Type 2 diabetes mellitus without complications: Secondary | ICD-10-CM

## 2015-04-05 DIAGNOSIS — M810 Age-related osteoporosis without current pathological fracture: Secondary | ICD-10-CM | POA: Diagnosis present

## 2015-04-05 DIAGNOSIS — I739 Peripheral vascular disease, unspecified: Secondary | ICD-10-CM | POA: Diagnosis present

## 2015-04-05 DIAGNOSIS — J439 Emphysema, unspecified: Secondary | ICD-10-CM | POA: Diagnosis not present

## 2015-04-05 DIAGNOSIS — Z90710 Acquired absence of both cervix and uterus: Secondary | ICD-10-CM | POA: Diagnosis not present

## 2015-04-05 DIAGNOSIS — S72012A Unspecified intracapsular fracture of left femur, initial encounter for closed fracture: Secondary | ICD-10-CM | POA: Diagnosis present

## 2015-04-05 DIAGNOSIS — Z923 Personal history of irradiation: Secondary | ICD-10-CM

## 2015-04-05 DIAGNOSIS — E785 Hyperlipidemia, unspecified: Secondary | ICD-10-CM | POA: Diagnosis present

## 2015-04-05 DIAGNOSIS — Z794 Long term (current) use of insulin: Secondary | ICD-10-CM

## 2015-04-05 DIAGNOSIS — Y92099 Unspecified place in other non-institutional residence as the place of occurrence of the external cause: Secondary | ICD-10-CM | POA: Diagnosis not present

## 2015-04-05 DIAGNOSIS — S72002D Fracture of unspecified part of neck of left femur, subsequent encounter for closed fracture with routine healing: Secondary | ICD-10-CM | POA: Diagnosis not present

## 2015-04-05 DIAGNOSIS — Z66 Do not resuscitate: Secondary | ICD-10-CM | POA: Diagnosis present

## 2015-04-05 DIAGNOSIS — E063 Autoimmune thyroiditis: Secondary | ICD-10-CM | POA: Diagnosis present

## 2015-04-05 DIAGNOSIS — Z885 Allergy status to narcotic agent status: Secondary | ICD-10-CM | POA: Diagnosis not present

## 2015-04-05 DIAGNOSIS — S72142A Displaced intertrochanteric fracture of left femur, initial encounter for closed fracture: Secondary | ICD-10-CM

## 2015-04-05 DIAGNOSIS — S72002A Fracture of unspecified part of neck of left femur, initial encounter for closed fracture: Secondary | ICD-10-CM | POA: Diagnosis present

## 2015-04-05 DIAGNOSIS — W0110XA Fall on same level from slipping, tripping and stumbling with subsequent striking against unspecified object, initial encounter: Secondary | ICD-10-CM | POA: Diagnosis present

## 2015-04-05 DIAGNOSIS — I2781 Cor pulmonale (chronic): Secondary | ICD-10-CM | POA: Diagnosis not present

## 2015-04-05 DIAGNOSIS — S72009A Fracture of unspecified part of neck of unspecified femur, initial encounter for closed fracture: Secondary | ICD-10-CM

## 2015-04-05 DIAGNOSIS — Z7951 Long term (current) use of inhaled steroids: Secondary | ICD-10-CM | POA: Diagnosis not present

## 2015-04-05 DIAGNOSIS — J449 Chronic obstructive pulmonary disease, unspecified: Secondary | ICD-10-CM | POA: Diagnosis present

## 2015-04-05 DIAGNOSIS — F329 Major depressive disorder, single episode, unspecified: Secondary | ICD-10-CM | POA: Diagnosis present

## 2015-04-05 DIAGNOSIS — M25552 Pain in left hip: Secondary | ICD-10-CM | POA: Diagnosis present

## 2015-04-05 LAB — BASIC METABOLIC PANEL
ANION GAP: 6 (ref 5–15)
BUN: 22 mg/dL (ref 6–23)
CO2: 31 mmol/L (ref 19–32)
Calcium: 8.7 mg/dL (ref 8.4–10.5)
Chloride: 100 mmol/L (ref 96–112)
Creatinine, Ser: 0.96 mg/dL (ref 0.50–1.10)
GFR, EST AFRICAN AMERICAN: 62 mL/min — AB (ref >90)
GFR, EST NON AFRICAN AMERICAN: 53 mL/min — AB (ref >90)
GLUCOSE: 151 mg/dL — AB (ref 70–99)
POTASSIUM: 4 mmol/L (ref 3.5–5.1)
SODIUM: 137 mmol/L (ref 135–145)

## 2015-04-05 LAB — CBC WITH DIFFERENTIAL/PLATELET
Basophils Absolute: 0 10*3/uL (ref 0.0–0.1)
Basophils Relative: 0 % (ref 0–1)
Eosinophils Absolute: 0.3 10*3/uL (ref 0.0–0.7)
Eosinophils Relative: 3 % (ref 0–5)
HCT: 40.6 % (ref 36.0–46.0)
HEMOGLOBIN: 12.5 g/dL (ref 12.0–15.0)
LYMPHS ABS: 1.1 10*3/uL (ref 0.7–4.0)
Lymphocytes Relative: 15 % (ref 12–46)
MCH: 28.3 pg (ref 26.0–34.0)
MCHC: 30.8 g/dL (ref 30.0–36.0)
MCV: 92.1 fL (ref 78.0–100.0)
Monocytes Absolute: 0.4 10*3/uL (ref 0.1–1.0)
Monocytes Relative: 5 % (ref 3–12)
Neutro Abs: 6 10*3/uL (ref 1.7–7.7)
Neutrophils Relative %: 77 % (ref 43–77)
Platelets: 272 10*3/uL (ref 150–400)
RBC: 4.41 MIL/uL (ref 3.87–5.11)
RDW: 13.8 % (ref 11.5–15.5)
WBC: 7.8 10*3/uL (ref 4.0–10.5)

## 2015-04-05 LAB — TROPONIN I

## 2015-04-05 LAB — GLUCOSE, CAPILLARY
Glucose-Capillary: 81 mg/dL (ref 70–99)
Glucose-Capillary: 119 mg/dL — ABNORMAL HIGH (ref 70–99)

## 2015-04-05 LAB — PROTIME-INR
INR: 0.91 (ref 0.00–1.49)
PROTHROMBIN TIME: 12.4 s (ref 11.6–15.2)

## 2015-04-05 LAB — SURGICAL PCR SCREEN
MRSA, PCR: POSITIVE — AB
Staphylococcus aureus: POSITIVE — AB

## 2015-04-05 MED ORDER — IPRATROPIUM BROMIDE 0.02 % IN SOLN: 0.5000 mg | RESPIRATORY_TRACT | Status: DC | PRN

## 2015-04-05 MED ORDER — ONDANSETRON HCL 4 MG PO TABS: 4.0000 mg | ORAL_TABLET | Freq: Four times a day (QID) | ORAL | Status: DC | PRN

## 2015-04-05 MED ORDER — TRAMADOL HCL 50 MG PO TABS
100.0000 mg | ORAL_TABLET | Freq: Every day | ORAL | Status: DC
  Administered 2015-04-05 – 2015-04-08 (×4): 100 mg via ORAL
  Filled 2015-04-05 (×4): qty 2

## 2015-04-05 MED ORDER — INSULIN ASPART 100 UNIT/ML ~~LOC~~ SOLN
0.0000 [IU] | Freq: Three times a day (TID) | SUBCUTANEOUS | Status: DC
  Administered 2015-04-06: 3 [IU] via SUBCUTANEOUS
  Administered 2015-04-07: 1 [IU] via SUBCUTANEOUS
  Administered 2015-04-07 (×2): 2 [IU] via SUBCUTANEOUS
  Administered 2015-04-08: 1 [IU] via SUBCUTANEOUS

## 2015-04-05 MED ORDER — FERROUS SULFATE 325 (65 FE) MG PO TABS
325.0000 mg | ORAL_TABLET | Freq: Every day | ORAL | Status: DC
  Administered 2015-04-07 – 2015-04-09 (×3): 325 mg via ORAL
  Filled 2015-04-05 (×3): qty 1

## 2015-04-05 MED ORDER — TIOTROPIUM BROMIDE MONOHYDRATE 18 MCG IN CAPS
18.0000 ug | ORAL_CAPSULE | Freq: Every day | RESPIRATORY_TRACT | Status: DC
  Administered 2015-04-06 – 2015-04-09 (×4): 18 ug via RESPIRATORY_TRACT
  Filled 2015-04-05: qty 5

## 2015-04-05 MED ORDER — LORATADINE 10 MG PO TABS
10.0000 mg | ORAL_TABLET | Freq: Every day | ORAL | Status: DC
  Administered 2015-04-07 – 2015-04-09 (×3): 10 mg via ORAL
  Filled 2015-04-05 (×3): qty 1

## 2015-04-05 MED ORDER — CLOPIDOGREL BISULFATE 75 MG PO TABS
75.0000 mg | ORAL_TABLET | Freq: Every day | ORAL | Status: DC
  Administered 2015-04-07 – 2015-04-09 (×3): 75 mg via ORAL
  Filled 2015-04-05 (×3): qty 1

## 2015-04-05 MED ORDER — ACETAMINOPHEN 650 MG RE SUPP: 650.0000 mg | Freq: Four times a day (QID) | RECTAL | Status: DC | PRN

## 2015-04-05 MED ORDER — ALBUTEROL SULFATE (2.5 MG/3ML) 0.083% IN NEBU: 2.5000 mg | INHALATION_SOLUTION | Freq: Four times a day (QID) | RESPIRATORY_TRACT | Status: DC | PRN

## 2015-04-05 MED ORDER — CLONAZEPAM 0.5 MG PO TABS
0.2500 mg | ORAL_TABLET | Freq: Every day | ORAL | Status: DC
  Administered 2015-04-07 – 2015-04-09 (×3): 0.25 mg via ORAL
  Filled 2015-04-05 (×3): qty 1

## 2015-04-05 MED ORDER — CLONAZEPAM 0.5 MG PO TABS
0.5000 mg | ORAL_TABLET | Freq: Every day | ORAL | Status: DC
  Administered 2015-04-05 – 2015-04-08 (×4): 0.5 mg via ORAL
  Filled 2015-04-05 (×4): qty 1

## 2015-04-05 MED ORDER — ALUM & MAG HYDROXIDE-SIMETH 200-200-20 MG/5ML PO SUSP: 30.0000 mL | Freq: Four times a day (QID) | ORAL | Status: DC | PRN

## 2015-04-05 MED ORDER — INSULIN GLARGINE 100 UNIT/ML ~~LOC~~ SOLN
5.0000 [IU] | Freq: Every day | SUBCUTANEOUS | Status: DC
  Administered 2015-04-05 – 2015-04-08 (×4): 5 [IU] via SUBCUTANEOUS
  Filled 2015-04-05 (×5): qty 0.05

## 2015-04-05 MED ORDER — ACETAMINOPHEN 325 MG PO TABS
650.0000 mg | ORAL_TABLET | Freq: Four times a day (QID) | ORAL | Status: DC | PRN
  Administered 2015-04-05 – 2015-04-09 (×4): 650 mg via ORAL
  Filled 2015-04-05 (×4): qty 2

## 2015-04-05 MED ORDER — CLONAZEPAM 0.5 MG PO TABS: 0.2500 mg | ORAL_TABLET | Freq: Two times a day (BID) | ORAL | Status: DC

## 2015-04-05 MED ORDER — SODIUM CHLORIDE 0.9 % IV SOLN
250.0000 mL | INTRAVENOUS | Status: DC | PRN
Start: 2015-04-05 — End: 2015-04-10

## 2015-04-05 MED ORDER — ALBUTEROL SULFATE HFA 108 (90 BASE) MCG/ACT IN AERS: 2.0000 | INHALATION_SPRAY | RESPIRATORY_TRACT | Status: DC | PRN

## 2015-04-05 MED ORDER — MORPHINE SULFATE 2 MG/ML IJ SOLN
2.0000 mg | INTRAMUSCULAR | Status: DC | PRN
  Administered 2015-04-05: 2 mg via INTRAVENOUS
  Filled 2015-04-05: qty 1

## 2015-04-05 MED ORDER — ONDANSETRON HCL 4 MG/2ML IJ SOLN: 4.0000 mg | Freq: Three times a day (TID) | INTRAMUSCULAR | Status: DC | PRN

## 2015-04-05 MED ORDER — HYDROMORPHONE HCL 1 MG/ML IJ SOLN: 0.2500 mg | INTRAMUSCULAR | Status: DC | PRN

## 2015-04-05 MED ORDER — CITALOPRAM HYDROBROMIDE 20 MG PO TABS
20.0000 mg | ORAL_TABLET | Freq: Every day | ORAL | Status: DC
  Administered 2015-04-07 – 2015-04-09 (×3): 20 mg via ORAL
  Filled 2015-04-05 (×3): qty 1

## 2015-04-05 MED ORDER — ONDANSETRON HCL 4 MG/2ML IJ SOLN: 4.0000 mg | Freq: Four times a day (QID) | INTRAMUSCULAR | Status: DC | PRN

## 2015-04-05 MED ORDER — CHLORHEXIDINE GLUCONATE CLOTH 2 % EX PADS
6.0000 | MEDICATED_PAD | Freq: Every day | CUTANEOUS | Status: DC
  Administered 2015-04-06 – 2015-04-08 (×3): 6 via TOPICAL

## 2015-04-05 MED ORDER — SODIUM CHLORIDE 0.9 % IV SOLN
INTRAVENOUS | Status: DC
  Administered 2015-04-05: 17:00:00 via INTRAVENOUS

## 2015-04-05 MED ORDER — SODIUM CHLORIDE 0.9 % IJ SOLN
3.0000 mL | Freq: Two times a day (BID) | INTRAMUSCULAR | Status: DC
  Administered 2015-04-07 – 2015-04-08 (×4): 3 mL via INTRAVENOUS

## 2015-04-05 MED ORDER — SODIUM CHLORIDE 0.9 % IJ SOLN: 3.0000 mL | INTRAMUSCULAR | Status: DC | PRN

## 2015-04-05 MED ORDER — MORPHINE SULFATE 2 MG/ML IJ SOLN: 1.0000 mg | INTRAMUSCULAR | Status: DC | PRN

## 2015-04-05 MED ORDER — OXYCODONE HCL 5 MG PO TABS: 5.0000 mg | ORAL_TABLET | ORAL | Status: DC | PRN

## 2015-04-05 MED ORDER — MUPIROCIN 2 % EX OINT
1.0000 "application " | TOPICAL_OINTMENT | Freq: Two times a day (BID) | CUTANEOUS | Status: DC
  Administered 2015-04-05 – 2015-04-09 (×7): 1 via NASAL
  Filled 2015-04-05: qty 22

## 2015-04-05 MED ORDER — SODIUM CHLORIDE 0.9 % IV SOLN: INTRAVENOUS | Status: DC

## 2015-04-05 MED ORDER — INSULIN ASPART 100 UNIT/ML ~~LOC~~ SOLN: 0.0000 [IU] | Freq: Every day | SUBCUTANEOUS | Status: DC

## 2015-04-05 MED ORDER — MOMETASONE FURO-FORMOTEROL FUM 100-5 MCG/ACT IN AERO
2.0000 | INHALATION_SPRAY | Freq: Two times a day (BID) | RESPIRATORY_TRACT | Status: DC
  Administered 2015-04-05 – 2015-04-09 (×8): 2 via RESPIRATORY_TRACT
  Filled 2015-04-05: qty 8.8

## 2015-04-05 NOTE — Consult Note (Signed)
ORTHOPAEDIC CONSULTATION  REQUESTING PHYSICIAN: Debbe Odea, MD  Chief Complaint: left hip pain  HPI: Jennifer Duran is a 81 y.o. female who complains of  Left hip pain after a mechanical fall today.  She was found to have a femoral neck fracture of the left hip in the [facility] ED.  She was transferred to [facility] for surgical correction tomorrow.  She lives at an independent living facility and is ambulatory with a walker at baseline.  Past Medical History  Diagnosis Date  . Allergic rhinitis, cause unspecified   . Unspecified essential hypertension   . Cor pulmonale   . COPD (chronic obstructive pulmonary disease)   . Osteoporosis   . Hashimoto's disease   . Peripheral vascular disease   . Hyperlipidemia   . Depression   . Urinary frequency   . Other atopic dermatitis and related conditions   . Abnormal involuntary movements(781.0)   . Hyposmolality and/or hyponatremia   . Peripheral vascular disease, unspecified   . Lung mass   . Insomnia   . History of radiation therapy 08/08/2013, 08/10/2013, 08/16/2013    SBRT 54 Gy to left upper lobe pulmonary nodule  . Type II or unspecified type diabetes mellitus without mention of complication, not stated as uncontrolled    Past Surgical History  Procedure Laterality Date  . Total abdominal hysterectomy    . Cataracts    . Rotator cuff repair    . Mastectomy Right 1952  . Eye surgery    . Radiation implant, female Left Aug. 2014    Left Breast  3 Tx's   History   Social History  . Marital Status: Widowed    Spouse Name: N/A  . Number of Children: N/A  . Years of Education: N/A   Social History Main Topics  . Smoking status: Former Smoker -- 2.00 packs/day for 53 years  . Smokeless tobacco: Never Used  . Alcohol Use: No  . Drug Use: No  . Sexual Activity: No   Other Topics Concern  . None   Social History Narrative   Family History  Problem Relation Age of Onset  . Cancer Mother     lung   . Lupus Father   .  Heart disease Sister    Allergies  Allergen Reactions  . Celecoxib Swelling  . Codeine Nausea And Vomiting  . Metronidazole Other (See Comments)    neuropathy  . Pregabalin Swelling  . Propranolol Hcl Other (See Comments)    Unknown  . Rofecoxib Other (See Comments)    Unknown  . Iodine Rash    Only if ingested  . Pregabalin Hives and Rash   Prior to Admission medications   Medication Sig Start Date End Date Taking? Authorizing Provider  albuterol (PROVENTIL HFA;VENTOLIN HFA) 108 (90 BASE) MCG/ACT inhaler Inhale 2 puffs into the lungs every 4 (four) hours as needed for wheezing or shortness of breath. 11/18/13  Yes Deneise Lever, MD  albuterol (PROVENTIL) (2.5 MG/3ML) 0.083% nebulizer solution Take 2.5 mg by nebulization every 6 (six) hours as needed for wheezing or shortness of breath.   Yes Historical Provider, MD  Cholecalciferol (VITAMIN D-3) 1000 UNITS CAPS Take 1 capsule by mouth daily.   Yes Historical Provider, MD  citalopram (CELEXA) 20 MG tablet Take 20 mg by mouth daily.   Yes Historical Provider, MD  clonazePAM (KLONOPIN) 0.5 MG tablet Take 0.25-0.5 mg by mouth 2 (two) times daily. 0.38m in the am 0.559min the PM 03/01/14  Yes Jessica K Eubanks, NP  clopidogrel (PLAVIX) 75 MG tablet Take 75 mg by mouth daily with breakfast.   Yes Historical Provider, MD  ferrous sulfate 325 (65 FE) MG tablet Take 325 mg by mouth daily.   Yes Historical Provider, MD  Fluticasone-Salmeterol (ADVAIR) 250-50 MCG/DOSE AEPB Inhale 1 puff into the lungs 2 (two) times daily.   Yes Historical Provider, MD  furosemide (LASIX) 40 MG tablet Take 40 mg by mouth daily.   Yes Historical Provider, MD  insulin aspart (NOVOLOG) 100 UNIT/ML injection Inject 5 Units into the skin 3 (three) times daily before meals. Give an additional 5 units if CBG > 150. Notify MD if CBG < 60 or > 40. Hold if BS < 70   Yes Historical Provider, MD  insulin glargine (LANTUS) 100 UNIT/ML injection Inject 10 Units into the skin  at bedtime.     Yes Historical Provider, MD  ipratropium (ATROVENT) 0.02 % nebulizer solution Take 0.5 mg by nebulization every 4 (four) hours as needed for wheezing or shortness of breath.   Yes Historical Provider, MD  lisinopril (PRINIVIL,ZESTRIL) 5 MG tablet Take 2.5 mg by mouth daily. HOLD if systolic blood pressure is less than 100 or diastolic BP is less than 60   Yes Historical Provider, MD  loratadine (CLARITIN) 10 MG tablet Take 10 mg by mouth daily.   Yes Historical Provider, MD  tiotropium (SPIRIVA) 18 MCG inhalation capsule Place 18 mcg into inhaler and inhale daily.   Yes Historical Provider, MD  traMADol (ULTRAM) 50 MG tablet Take 100 mg by mouth at bedtime. 03/15/14  Yes Jessica K Eubanks, NP  ondansetron (ZOFRAN ODT) 4 MG disintegrating tablet Take 1 tablet (4 mg total) by mouth every 8 (eight) hours as needed for nausea. Patient not taking: Reported on 03/22/2015 07/15/13   Robert Lockwood, MD   Dg Chest 1 View  04/05/2015   CLINICAL DATA:  Preoperative evaluation, hip fracture, history COPD, Hashimoto's disease, cor pulmonale, lung mass post radiation, diabetes  EXAM: CHEST  1 VIEW  COMPARISON:  03/22/2015  FINDINGS: Normal heart size, mediastinal contours and pulmonary vascularity.  Atherosclerotic calcifications aorta.  Emphysematous and bronchitic changes consistent with COPD.  Scattered interstitial prominence in mid to lower lungs.  Vague LEFT upper lobe density again identified, increased versus previous exam, though slightly more similar to the earlier study of 06/08/2014  No acute infiltrate, pleural effusion or pneumothorax.  Bones demineralized.  IMPRESSION: COPD changes with scattered interstitial disease and a vague persistent area of increased attenuation in the LEFT upper lobe question slightly increased in counts acuity; recommend CT chest to exclude recurrent nodule.   Electronically Signed   By: Mark  Boles M.D.   On: 04/05/2015 13:16   Ct Head Wo Contrast  04/05/2015    CLINICAL DATA:  Tripped and fall with injury to posterior skull. Headache and neck pain after injury. Initial encounter.  EXAM: CT HEAD WITHOUT CONTRAST  CT CERVICAL SPINE WITHOUT CONTRAST  TECHNIQUE: Multidetector CT imaging of the head and cervical spine was performed following the standard protocol without intravenous contrast. Multiplanar CT image reconstructions of the cervical spine were also generated.  COMPARISON:  None.  FINDINGS: CT HEAD FINDINGS  There is evidence of small vessel disease in the periventricular white matter. The brain demonstrates no evidence of hemorrhage, infarction, edema, mass effect, extra-axial fluid collection, hydrocephalus or mass lesion. The skull is unremarkable and shows no evidence of fracture.  CT CERVICAL SPINE FINDINGS  The cervical spine   shows normal alignment. There is no evidence of acute fracture or subluxation. No soft tissue swelling or hematoma is identified. No bony or soft tissue lesions are seen. Diffuse spondylosis of the cervical spine present at nearly every level. There is an associated mild anterolisthesis of C4 on C5 which is likely degenerative and measures approximately 3 mm. No evidence of acute fracture, traumatic subluxation or soft tissue swelling. The visualized airway is normally patent. Visualized lung apices demonstrate evidence of emphysema. There is incidental calcified plaque noted at both carotid bifurcations.  IMPRESSION: No acute head injury or cervical spine injury. Evidence of small vessel disease in the periventricular white matter. Diffuse spondylosis of the cervical spine present. Incidental carotid atherosclerosis.   Electronically Signed   By: Aletta Edouard M.D.   On: 04/05/2015 12:18   Ct Cervical Spine Wo Contrast  04/05/2015   CLINICAL DATA:  Tripped and fall with injury to posterior skull. Headache and neck pain after injury. Initial encounter.  EXAM: CT HEAD WITHOUT CONTRAST  CT CERVICAL SPINE WITHOUT CONTRAST  TECHNIQUE:  Multidetector CT imaging of the head and cervical spine was performed following the standard protocol without intravenous contrast. Multiplanar CT image reconstructions of the cervical spine were also generated.  COMPARISON:  None.  FINDINGS: CT HEAD FINDINGS  There is evidence of small vessel disease in the periventricular white matter. The brain demonstrates no evidence of hemorrhage, infarction, edema, mass effect, extra-axial fluid collection, hydrocephalus or mass lesion. The skull is unremarkable and shows no evidence of fracture.  CT CERVICAL SPINE FINDINGS  The cervical spine shows normal alignment. There is no evidence of acute fracture or subluxation. No soft tissue swelling or hematoma is identified. No bony or soft tissue lesions are seen. Diffuse spondylosis of the cervical spine present at nearly every level. There is an associated mild anterolisthesis of C4 on C5 which is likely degenerative and measures approximately 3 mm. No evidence of acute fracture, traumatic subluxation or soft tissue swelling. The visualized airway is normally patent. Visualized lung apices demonstrate evidence of emphysema. There is incidental calcified plaque noted at both carotid bifurcations.  IMPRESSION: No acute head injury or cervical spine injury. Evidence of small vessel disease in the periventricular white matter. Diffuse spondylosis of the cervical spine present. Incidental carotid atherosclerosis.   Electronically Signed   By: Aletta Edouard M.D.   On: 04/05/2015 12:18   Dg Shoulder Left  04/05/2015   CLINICAL DATA:  [place] this morning over shoe box in her living room, put her LEFT arm out to brace the fall, LEFT shoulder pain  EXAM: LEFT SHOULDER - 2+ VIEW  COMPARISON:  None  FINDINGS: Mild osseous demineralization.  AC joint alignment normal.  Glenohumeral joint alignment normal.  No acute fracture, dislocation, or bone destruction.  Visualized LEFT ribs intact.  IMPRESSION: Osseous demineralization without  acute abnormalities.   Electronically Signed   By: Lavonia Dana M.D.   On: 04/05/2015 13:10   Dg Hip Unilat With Pelvis 2-3 Views Left  04/05/2015   CLINICAL DATA:  Tripped over a shoe box and fell backwards, LEFT hip pain  EXAM: LEFT HIP (WITH PELVIS) 2-3 VIEWS  COMPARISON:  None  FINDINGS: Diffuse osseous demineralization.  Minimal narrowing of the hips joints bilaterally.  SI joints symmetric.  Minimally displaced subcapital fracture LEFT femoral neck.  No dislocation.  Pelvis appears intact.  Scattered atherosclerotic calcifications and few pelvic phleboliths.  Slight prominent stool in rectum.  IMPRESSION: Osseous demineralization with a minimally displaced  subcapital fracture LEFT femoral neck.   Electronically Signed   By: Mark  Boles M.D.   On: 04/05/2015 10:55    Positive ROS: All other systems have been reviewed and were otherwise negative with the exception of those mentioned in the HPI and as above.  Labs cbc  Recent Labs  04/05/15 1133  WBC 7.8  HGB 12.5  HCT 40.6  PLT 272    Labs inflam No results for input(s): CRP in the last 72 hours.  Invalid input(s): ESR  Labs coag  Recent Labs  04/05/15 1133  INR 0.91     Recent Labs  04/05/15 1133  NA 137  K 4.0  CL 100  CO2 31  GLUCOSE 151*  BUN 22  CREATININE 0.96  CALCIUM 8.7    Physical Exam: Filed Vitals:   04/05/15 1550  BP: 122/61  Pulse: 99  Temp: 99.8 F (37.7 C)  Resp: 21   General: Alert, no acute distress Cardiovascular: No pedal edema Respiratory: No cyanosis, no use of accessory musculature GI: No organomegaly, abdomen is soft and non-tender Skin: No lesions in the area of chief complaint other than those listed below in MSK exam.  Neurologic: Decreased sensation in the BLE due to diabetic neuropathy Psychiatric: Patient is competent for consent with normal mood and affect Lymphatic: No axillary or cervical lymphadenopathy  MUSCULOSKELETAL:  Mild swelling but no erythema or the left  hip.  Diffusely tender to palpation.  Sensation is intact distally with decreased sensation in the B feet due to diabetic neuropathy.  +2 pulses. Other extremities are atraumatic with painless ROM and NVI.  Assessment: L femoral neck fracture  Plan: Plan is to take to the OR tomorrow morning for hip pinning.  Will make NPO after midnight tonight. Weight Bearing Status: bedrest/NWB till surgery tomorrow PT VTE px: SCD's and chemical prophylaxis is being held due to surgery tomorrow.  Plavix will be restarted post-op.   , Marie, PA-C Cell [phone]   04/05/2015 4:04 PM 

## 2015-04-05 NOTE — ED Notes (Signed)
Bed: WA08 Expected date:  Expected time:  Means of arrival:  Comments: EMS- 81yo M, fall, hip pain

## 2015-04-05 NOTE — H&P (Addendum)
[facility] History and Physical  Jennifer Duran GBT:[id number] DOB: 13-Mar-1929 DOA: 04/05/2015   PCP: No primary care provider on file. sees Dr Annamaria Boots and Dr Sondra Come   Chief Complaint: fall and pain left hip and back of head  HPI: Jennifer Duran is a 81 y.o. female from assisted living for about 1 yr. She tripped today after breakfast and fell on her left side and hit the back of her head. She called for help and was brought to the hospital. She is found to have a left hip fracture- pain is 2/10 as long as she does not move. Headache and neck ache is present but is also mild. CT head and neck negative for acute abnormalities. She also has left shoulder pain which is mild- no fracture noted on xrays of shoulder.   No complaint of chest pain on exertion or at rest. Dyspnea on exertion is no worse than her baseline.   General: The patient denies anorexia, fever, weight loss- she has gained about 4 lbs- has been trying to gain weight Cardiac: Denies chest pain, syncope, palpitations- has occasional mild pedal edema which resolves with elevated Respiratory: Denies cough- no new shortness of breath or wheezing GI: Denies severe indigestion/heartburn, abdominal pain, nausea, vomiting, diarrhea and constipation- has been noticing a trace of blood on her disposable diapers this past week - only happens overnight.  GU: Denies hematuria, incontinence, dysuria  Musculoskeletal: per HPI Skin: Denies suspicious skin lesions Neurologic: Denies focal weakness or numbness, change in vision Psychiatry: Denies depression or anxiety. Hematologic: + bruising or bleeding   Past Medical History  Diagnosis Date  . Allergic rhinitis, cause unspecified   . Unspecified essential hypertension   . Cor pulmonale   . COPD (chronic obstructive pulmonary disease) on 2 L O2   . Osteoporosis   . Hashimoto's disease   . Peripheral vascular disease   . Hyperlipidemia   . Depression   . Urinary frequency   .  Other atopic dermatitis and related conditions   . Abnormal involuntary movements(781.0)   . Hyposmolality and/or hyponatremia   . Peripheral vascular disease, unspecified   . PET positive lung nodule s/p radiation treatment in 2015   . Insomnia   . History of radiation therapy 08/08/2013, 08/10/2013, 08/16/2013    SBRT 54 Gy to left upper lobe pulmonary nodule  . Type II or unspecified type diabetes mellitus without mention of complication, not stated as uncontrolled     Past Surgical History  Procedure Laterality Date  . Total abdominal hysterectomy with appendectomy    . Cataracts    . Rotator cuff repair    .  lumpectomy x 3 on right breast Right 1952  . Eye surgery    Tonsillectomy  Social History: quit smoking in 1968- rarely drinks alcohol Lives at assisted living   Allergies  Allergen Reactions  . Celecoxib Swelling  . Codeine Nausea And Vomiting  . Metronidazole Other (See Comments)    neuropathy  . Pregabalin Swelling  . Propranolol Hcl Other (See Comments)    Unknown  . Rofecoxib Other (See Comments)    Unknown  . Iodine Rash    Only if ingested  . Pregabalin Hives and Rash    Family History  Problem Relation Age of Onset  . Cancer Mother     lung   . Lupus Father   . Heart disease Sister      Prior to Admission medications   Medication Sig Start Date End Date  Taking? Authorizing Provider  albuterol (PROVENTIL HFA;VENTOLIN HFA) 108 (90 BASE) MCG/ACT inhaler Inhale 2 puffs into the lungs every 4 (four) hours as needed for wheezing or shortness of breath. 11/18/13  Yes Deneise Lever, MD  albuterol (PROVENTIL) (2.5 MG/3ML) 0.083% nebulizer solution Take 2.5 mg by nebulization every 6 (six) hours as needed for wheezing or shortness of breath.   Yes Historical Provider, MD  Cholecalciferol (VITAMIN D-3) 1000 UNITS CAPS Take 1 capsule by mouth daily.   Yes Historical Provider, MD  citalopram (CELEXA) 20 MG tablet Take 20 mg by mouth daily.   Yes Historical  Provider, MD  clonazePAM (KLONOPIN) 0.5 MG tablet Take 0.25-0.5 mg by mouth 2 (two) times daily. 0.25mg  in the am 0.5mg  in the PM 03/01/14  Yes Lauree Chandler, NP  clopidogrel (PLAVIX) 75 MG tablet Take 75 mg by mouth daily with breakfast.   Yes Historical Provider, MD  ferrous sulfate 325 (65 FE) MG tablet Take 325 mg by mouth daily.   Yes Historical Provider, MD  Fluticasone-Salmeterol (ADVAIR) 250-50 MCG/DOSE AEPB Inhale 1 puff into the lungs 2 (two) times daily.   Yes Historical Provider, MD  furosemide (LASIX) 40 MG tablet Take 40 mg by mouth daily.   Yes Historical Provider, MD  insulin aspart (NOVOLOG) 100 UNIT/ML injection Inject 5 Units into the skin 3 (three) times daily before meals. Give an additional 5 units if CBG > 150. Notify MD if CBG < 60 or > 40. Hold if BS < 70   Yes Historical Provider, MD  insulin glargine (LANTUS) 100 UNIT/ML injection Inject 10 Units into the skin at bedtime.     Yes Historical Provider, MD  ipratropium (ATROVENT) 0.02 % nebulizer solution Take 0.5 mg by nebulization every 4 (four) hours as needed for wheezing or shortness of breath.   Yes Historical Provider, MD  lisinopril (PRINIVIL,ZESTRIL) 5 MG tablet Take 2.5 mg by mouth daily. HOLD if systolic blood pressure is less than 272 or diastolic BP is less than 60   Yes Historical Provider, MD  loratadine (CLARITIN) 10 MG tablet Take 10 mg by mouth daily.   Yes Historical Provider, MD  tiotropium (SPIRIVA) 18 MCG inhalation capsule Place 18 mcg into inhaler and inhale daily.   Yes Historical Provider, MD  traMADol (ULTRAM) 50 MG tablet Take 100 mg by mouth at bedtime. 03/15/14  Yes Lauree Chandler, NP  ondansetron (ZOFRAN ODT) 4 MG disintegrating tablet Take 1 tablet (4 mg total) by mouth every 8 (eight) hours as needed for nausea. Patient not taking: Reported on 03/22/2015 07/15/13   Carmin Muskrat, MD     Physical Exam: Filed Vitals:   04/05/15 0952  BP: 125/55  Pulse: 82  Temp: 97.5 F (36.4 C)   TempSrc: Oral  Resp: 16  Weight: 56.246 kg (124 lb)  SpO2: 91%     General: AAO x 3, no acute distress HEENT: Normocephalic and Atraumatic, Mucous membranes pink                PERRLA; EOM intact; No scleral icterus,                 Nares: Patent, Oropharynx: Clear, Fair Dentition                 Neck: FROM, no cervical lymphadenopathy, thyromegaly, carotid bruit or JVD;  Breasts: deferred CHEST WALL: No tenderness  CHEST: Normal respiration, clear to auscultation bilaterally - PO2 91 % on 2 L HEART: Regular rate and  rhythm; no murmurs rubs or gallops  BACK: No kyphosis or scoliosis; no CVA tenderness  GI: Positive Bowel Sounds, soft, non-tender; no masses, no organomegaly Rectal Exam: deferred MSK: No cyanosis, clubbing, or edema Genitalia: not examined  SKIN:  no rash or ulceration  CNS: Alert and Oriented x 4, Nonfocal exam, CN 2-12 intact  Labs on Admission:  Basic Metabolic Panel:  Recent Labs Lab 04/05/15 1133  NA 137  K 4.0  CL 100  CO2 31  GLUCOSE 151*  BUN 22  CREATININE 0.96  CALCIUM 8.7   Liver Function Tests: No results for input(s): AST, ALT, ALKPHOS, BILITOT, PROT, ALBUMIN in the last 168 hours. No results for input(s): LIPASE, AMYLASE in the last 168 hours. No results for input(s): AMMONIA in the last 168 hours. CBC:  Recent Labs Lab 04/05/15 1133  WBC 7.8  NEUTROABS 6.0  HGB 12.5  HCT 40.6  MCV 92.1  PLT 272   Cardiac Enzymes:  Recent Labs Lab 04/05/15 1133  TROPONINI <0.03    BNP (last 3 results) No results for input(s): BNP in the last 8760 hours.  ProBNP (last 3 results)  Recent Labs  06/08/14 1610  PROBNP 188.3    CBG: No results for input(s): GLUCAP in the last 168 hours.  Radiological Exams on Admission: Dg Chest 1 View  04/05/2015   CLINICAL DATA:  Preoperative evaluation, hip fracture, history COPD, Hashimoto's disease, cor pulmonale, lung mass post radiation, diabetes  EXAM: CHEST  1 VIEW  COMPARISON:  03/22/2015   FINDINGS: Normal heart size, mediastinal contours and pulmonary vascularity.  Atherosclerotic calcifications aorta.  Emphysematous and bronchitic changes consistent with COPD.  Scattered interstitial prominence in mid to lower lungs.  Vague LEFT upper lobe density again identified, increased versus previous exam, though slightly more similar to the earlier study of 06/08/2014  No acute infiltrate, pleural effusion or pneumothorax.  Bones demineralized.  IMPRESSION: COPD changes with scattered interstitial disease and a vague persistent area of increased attenuation in the LEFT upper lobe question slightly increased in counts acuity; recommend CT chest to exclude recurrent nodule.   Electronically Signed   By: Lavonia Dana M.D.   On: 04/05/2015 13:16   Ct Head Wo Contrast  04/05/2015   CLINICAL DATA:  Tripped and fall with injury to posterior skull. Headache and neck pain after injury. Initial encounter.  EXAM: CT HEAD WITHOUT CONTRAST  CT CERVICAL SPINE WITHOUT CONTRAST  TECHNIQUE: Multidetector CT imaging of the head and cervical spine was performed following the standard protocol without intravenous contrast. Multiplanar CT image reconstructions of the cervical spine were also generated.  COMPARISON:  None.  FINDINGS: CT HEAD FINDINGS  There is evidence of small vessel disease in the periventricular white matter. The brain demonstrates no evidence of hemorrhage, infarction, edema, mass effect, extra-axial fluid collection, hydrocephalus or mass lesion. The skull is unremarkable and shows no evidence of fracture.  CT CERVICAL SPINE FINDINGS  The cervical spine shows normal alignment. There is no evidence of acute fracture or subluxation. No soft tissue swelling or hematoma is identified. No bony or soft tissue lesions are seen. Diffuse spondylosis of the cervical spine present at nearly every level. There is an associated mild anterolisthesis of C4 on C5 which is likely degenerative and measures approximately 3  mm. No evidence of acute fracture, traumatic subluxation or soft tissue swelling. The visualized airway is normally patent. Visualized lung apices demonstrate evidence of emphysema. There is incidental calcified plaque noted at both carotid bifurcations.  IMPRESSION: No acute head injury or cervical spine injury. Evidence of small vessel disease in the periventricular white matter. Diffuse spondylosis of the cervical spine present. Incidental carotid atherosclerosis.   Electronically Signed   By: Aletta Edouard M.D.   On: 04/05/2015 12:18   Ct Cervical Spine Wo Contrast  04/05/2015   CLINICAL DATA:  Tripped and fall with injury to posterior skull. Headache and neck pain after injury. Initial encounter.  EXAM: CT HEAD WITHOUT CONTRAST  CT CERVICAL SPINE WITHOUT CONTRAST  TECHNIQUE: Multidetector CT imaging of the head and cervical spine was performed following the standard protocol without intravenous contrast. Multiplanar CT image reconstructions of the cervical spine were also generated.  COMPARISON:  None.  FINDINGS: CT HEAD FINDINGS  There is evidence of small vessel disease in the periventricular white matter. The brain demonstrates no evidence of hemorrhage, infarction, edema, mass effect, extra-axial fluid collection, hydrocephalus or mass lesion. The skull is unremarkable and shows no evidence of fracture.  CT CERVICAL SPINE FINDINGS  The cervical spine shows normal alignment. There is no evidence of acute fracture or subluxation. No soft tissue swelling or hematoma is identified. No bony or soft tissue lesions are seen. Diffuse spondylosis of the cervical spine present at nearly every level. There is an associated mild anterolisthesis of C4 on C5 which is likely degenerative and measures approximately 3 mm. No evidence of acute fracture, traumatic subluxation or soft tissue swelling. The visualized airway is normally patent. Visualized lung apices demonstrate evidence of emphysema. There is incidental  calcified plaque noted at both carotid bifurcations.  IMPRESSION: No acute head injury or cervical spine injury. Evidence of small vessel disease in the periventricular white matter. Diffuse spondylosis of the cervical spine present. Incidental carotid atherosclerosis.   Electronically Signed   By: Aletta Edouard M.D.   On: 04/05/2015 12:18   Dg Shoulder Left  04/05/2015   CLINICAL DATA:  [place] this morning over shoe box in her living room, put her LEFT arm out to brace the fall, LEFT shoulder pain  EXAM: LEFT SHOULDER - 2+ VIEW  COMPARISON:  None  FINDINGS: Mild osseous demineralization.  AC joint alignment normal.  Glenohumeral joint alignment normal.  No acute fracture, dislocation, or bone destruction.  Visualized LEFT ribs intact.  IMPRESSION: Osseous demineralization without acute abnormalities.   Electronically Signed   By: Lavonia Dana M.D.   On: 04/05/2015 13:10   Dg Hip Unilat With Pelvis 2-3 Views Left  04/05/2015   CLINICAL DATA:  Tripped over a shoe box and fell backwards, LEFT hip pain  EXAM: LEFT HIP (WITH PELVIS) 2-3 VIEWS  COMPARISON:  None  FINDINGS: Diffuse osseous demineralization.  Minimal narrowing of the hips joints bilaterally.  SI joints symmetric.  Minimally displaced subcapital fracture LEFT femoral neck.  No dislocation.  Pelvis appears intact.  Scattered atherosclerotic calcifications and few pelvic phleboliths.  Slight prominent stool in rectum.  IMPRESSION: Osseous demineralization with a minimally displaced subcapital fracture LEFT femoral neck.   Electronically Signed   By: Lavonia Dana M.D.   On: 04/05/2015 10:55    EKG: Independently reviewed. Sinus rhythm at 91 bpm- no ST or T wave changes  Assessment/Plan Principal Problem:   Hip fracture, left - management per ortho- ER has spoken with DR Percell Miller who would like her transferred to [facility] for [facility] tomorrow - she is a moderate to high risk considering her sever COPD as documented by Dr Annamaria Boots- she understands  this  Active Problems:  Essential hypertension - BP has been controlled and she has not been given her Lisinopril in the past week    COPD with emphysema - cont O2 at 2 L, nebs, Spiriva and Advair ([place] in the hospital)    Cor pulmonale - hold Lasix for tomorrow (recieved already at ALF today) as she has not eaten and drank today and again will not after midnight  H/o PET positive nodule - received prophylactic radiation- according to last note on 09/01/15, she was supposed to f/u with Dr Sondra Come in 6 months which was this March- she states she has an appt coming up - noted to possibly have a nodule on CXR today- would be good to repeat her CT scan prior to d/c or communicate with Dr Sondra Come about doing it as outpt    DM type 2, goal A1c below 7 - will give half dose of Lantus at 5 U and a low dose sliding scale for now  Depression/ Anxiety - cont Klonopin and Celexa  NOTE: she is not sure why she is on Plavix- Ortho notes to continue it.     Consulted: ortho- Dr Percell Miller by ER  Code Status: DNR Family Communication: Zamyra Allensworth- son - [phone] DVT Prophylaxis:Per ortho  Time spent: 70 min  [place], MD [facility]  If 7PM-7AM, please contact night-coverage [email or web address] 04/05/2015, 1:59 PM

## 2015-04-05 NOTE — ED Notes (Signed)
Pt states tripped shoe box and fell backwards and hit head. Pt c/o head, neck, and left hip pain.

## 2015-04-05 NOTE — Anesthesia Preprocedure Evaluation (Signed)
Anesthesia Evaluation  Patient identified by MRN, date of birth, ID band Patient awake    Reviewed: Allergy & Precautions, NPO status , Patient's Chart, lab work & pertinent test results  Airway        Dental   Pulmonary neg pulmonary ROS, shortness of breath, pneumonia -, COPDformer smoker,          Cardiovascular hypertension, Pt. on medications + Peripheral Vascular Disease     Neuro/Psych PSYCHIATRIC DISORDERS Depression negative neurological ROS  negative psych ROS   GI/Hepatic negative GI ROS, Neg liver ROS,   Endo/Other  negative endocrine ROSdiabetes, Type 2, Oral Hypoglycemic Agents  Renal/GU negative Renal ROS     Musculoskeletal negative musculoskeletal ROS (+)   Abdominal   Peds  Hematology negative hematology ROS (+)   Anesthesia Other Findings   Reproductive/Obstetrics negative OB ROS                             Anesthesia Physical Anesthesia Plan  ASA: III  Anesthesia Plan: General   Post-op Pain Management:    Induction: Intravenous  Airway Management Planned: Oral ETT  Additional Equipment:   Intra-op Plan:   Post-operative Plan: Extubation in OR  Informed Consent: I have reviewed the patients History and Physical, chart, labs and discussed the procedure including the risks, benefits and alternatives for the proposed anesthesia with the patient or authorized representative who has indicated his/her understanding and acceptance.   Dental advisory given  Plan Discussed with: CRNA  Anesthesia Plan Comments:         Anesthesia Quick Evaluation

## 2015-04-05 NOTE — Consult Note (Signed)
I have reviewed her films and tentatively recommend Hip pinning at [facility] OR tomorrow at 7am. This is pending clearance. Incision is minimal so I do not recommend waiting for the plavix to exit her system.   Ok to continue plavix, will add additional px post.   Please transfer to [facility] asap and clear for surgery if possible.   I will perform formal consult in the morning.   Renette Butters [phone]

## 2015-04-05 NOTE — ED Provider Notes (Signed)
CSN: [id number]     Arrival date & time 04/05/15  6761 History   First MD Initiated Contact with Patient 04/05/15 1058     Chief Complaint  Patient presents with  . Fall     HPI Pt was seen at 1105. Per pt, c/o sudden onset and resolution of one episode of trip and fall that occurred PTA. Pt states she tripped over a box and fell backwards to her left. States she hit her head, left shoulder, and hip on the floor. Pt states she was unable to weight bear on her LLE after the fall due to pain in her left hip. Denies prodromal symptoms before the fall. Denies syncope/LOC, no AMS, no CP/palpitations, no abd pain, no N/V/D, no neck or back pain, no focal motor weakness, no tingling/numbness in extremities.    Past Medical History  Diagnosis Date  . Allergic rhinitis, cause unspecified   . Unspecified essential hypertension   . Cor pulmonale   . COPD (chronic obstructive pulmonary disease)   . Osteoporosis   . Hashimoto's disease   . Peripheral vascular disease   . Hyperlipidemia   . Depression   . Urinary frequency   . Other atopic dermatitis and related conditions   . Abnormal involuntary movements(781.0)   . Hyposmolality and/or hyponatremia   . Peripheral vascular disease, unspecified   . Lung mass   . Insomnia   . History of radiation therapy 08/08/2013, 08/10/2013, 08/16/2013    SBRT 54 Gy to left upper lobe pulmonary nodule  . Type II or unspecified type diabetes mellitus without mention of complication, not stated as uncontrolled    Past Surgical History  Procedure Laterality Date  . Total abdominal hysterectomy    . Cataracts    . Rotator cuff repair    . Mastectomy Right 1952  . Eye surgery    . Radiation implant, female Left Aug. 2014    Left Breast  3 Tx's   Family History  Problem Relation Age of Onset  . Cancer Mother     lung   . Lupus Father   . Heart disease Sister    History  Substance Use Topics  . Smoking status: Former Smoker -- 2.00 packs/day for 53  years  . Smokeless tobacco: Never Used  . Alcohol Use: No    Review of Systems ROS: Statement: All systems negative except as marked or noted in the HPI; Constitutional: Negative for fever and chills. ; ; Eyes: Negative for eye pain, redness and discharge. ; ; ENMT: Negative for ear pain, hoarseness, nasal congestion, sinus pressure and sore throat. ; ; Cardiovascular: Negative for chest pain, palpitations, diaphoresis, dyspnea and peripheral edema. ; ; Respiratory: Negative for cough, wheezing and stridor. ; ; Gastrointestinal: Negative for nausea, vomiting, diarrhea, abdominal pain, blood in stool, hematemesis, jaundice and rectal bleeding. . ; ; Genitourinary: Negative for dysuria, flank pain and hematuria. ; ; Musculoskeletal: +left hip pain, head injury. Negative for back pain and neck pain. Negative for swelling; ; Skin: Negative for pruritus, rash, abrasions, blisters, bruising and skin lesion.; ; Neuro: Negative for headache, lightheadedness and neck stiffness. Negative for weakness, altered level of consciousness , altered mental status, extremity weakness, paresthesias, involuntary movement, seizure and syncope.      Allergies  Celecoxib; Codeine; Metronidazole; Pregabalin; Propranolol hcl; Rofecoxib; Iodine; and Pregabalin  Home Medications   Prior to Admission medications   Medication Sig Start Date End Date Taking? Authorizing Provider  albuterol (PROVENTIL HFA;VENTOLIN HFA) 108 (90 BASE)  MCG/ACT inhaler Inhale 2 puffs into the lungs every 4 (four) hours as needed for wheezing or shortness of breath. 11/18/13  Yes Deneise Lever, MD  albuterol (PROVENTIL) (2.5 MG/3ML) 0.083% nebulizer solution Take 2.5 mg by nebulization every 6 (six) hours as needed for wheezing or shortness of breath.   Yes Historical Provider, MD  Cholecalciferol (VITAMIN D-3) 1000 UNITS CAPS Take 1 capsule by mouth daily.   Yes Historical Provider, MD  citalopram (CELEXA) 20 MG tablet Take 20 mg by mouth daily.    Yes Historical Provider, MD  clonazePAM (KLONOPIN) 0.5 MG tablet Take 0.25-0.5 mg by mouth 2 (two) times daily. 0.25mg  in the am 0.5mg  in the PM 03/01/14  Yes Lauree Chandler, NP  clopidogrel (PLAVIX) 75 MG tablet Take 75 mg by mouth daily with breakfast.   Yes Historical Provider, MD  ferrous sulfate 325 (65 FE) MG tablet Take 325 mg by mouth daily.   Yes Historical Provider, MD  Fluticasone-Salmeterol (ADVAIR) 250-50 MCG/DOSE AEPB Inhale 1 puff into the lungs 2 (two) times daily.   Yes Historical Provider, MD  furosemide (LASIX) 40 MG tablet Take 40 mg by mouth daily.   Yes Historical Provider, MD  insulin aspart (NOVOLOG) 100 UNIT/ML injection Inject 5 Units into the skin 3 (three) times daily before meals. Give an additional 5 units if CBG > 150. Notify MD if CBG < 60 or > 40. Hold if BS < 70   Yes Historical Provider, MD  insulin glargine (LANTUS) 100 UNIT/ML injection Inject 10 Units into the skin at bedtime.     Yes Historical Provider, MD  ipratropium (ATROVENT) 0.02 % nebulizer solution Take 0.5 mg by nebulization every 4 (four) hours as needed for wheezing or shortness of breath.   Yes Historical Provider, MD  lisinopril (PRINIVIL,ZESTRIL) 5 MG tablet Take 2.5 mg by mouth daily. HOLD if systolic blood pressure is less than 761 or diastolic BP is less than 60   Yes Historical Provider, MD  loratadine (CLARITIN) 10 MG tablet Take 10 mg by mouth daily.   Yes Historical Provider, MD  tiotropium (SPIRIVA) 18 MCG inhalation capsule Place 18 mcg into inhaler and inhale daily.   Yes Historical Provider, MD  traMADol (ULTRAM) 50 MG tablet Take 100 mg by mouth at bedtime. 03/15/14  Yes Lauree Chandler, NP  ondansetron (ZOFRAN ODT) 4 MG disintegrating tablet Take 1 tablet (4 mg total) by mouth every 8 (eight) hours as needed for nausea. Patient not taking: Reported on 03/22/2015 07/15/13   Carmin Muskrat, MD   BP 125/55 mmHg  Pulse 82  Temp(Src) 97.5 F (36.4 C) (Oral)  Resp 16  Wt 124 lb  (56.246 kg)  SpO2 91% Physical Exam  1110: Physical examination: Vital signs and O2 SAT: Reviewed; Constitutional: Well developed, Well nourished, Well hydrated, In no acute distress; Head and Face: Normocephalic, Atraumatic; Eyes: EOMI, PERRL, No scleral icterus; ENMT: Mouth and pharynx normal, Left TM normal, Right TM normal, Mucous membranes moist; Neck: Immobilized in C-collar, Trachea midline; Spine: No midline CS, TS, LS tenderness.; Cardiovascular: Regular rate and rhythm, No gallop; Respiratory: Breath sounds clear & equal bilaterally, No wheezes. Normal respiratory effort/excursion; Chest: Nontender, No deformity, Movement normal, No crepitus, No abrasions or ecchymosis.; Abdomen: Soft, Nontender, Nondistended, Normal bowel sounds, No abrasions or ecchymosis.; Genitourinary: No CVA tenderness;; Extremities: No deformity, Pelvis stable. +TTP left hip with decreased ROM, no abrasions or ecchymosis. NMS intact left foot. NT left knee/ankle/foot. Otherwise full range of motion major/large joints  of bilat UE's and LE's without pain or tenderness to palp, Neurovascularly intact, Pulses normal, No edema. NT left clavicle/shoulder/elbow/wrist/hand.; Neuro: AA&Ox3, GCS 15.  Major CN grossly intact. Speech clear. No gross focal motor or sensory deficits in extremities.; Skin: Color normal, Warm, Dry.    ED Course  Procedures     EKG Interpretation   Date/Time:  Thursday April 05 2015 11:50:39 EDT Ventricular Rate:  91 PR Interval:  149 QRS Duration: 78 QT Interval:  370 QTC Calculation: 455 R Axis:   78 Text Interpretation:  Sinus rhythm LAE, consider biatrial enlargement  Nonspecific ST and T wave abnormality Baseline wander When compared with  ECG of 06/08/2014 No significant change was found Confirmed by [facility]   MD, Nunzio Cory [phone]) on 04/05/2015 11:56:32 AM      MDM  MDM Reviewed: previous chart, nursing note and vitals Reviewed previous: labs and ECG Interpretation: labs, ECG,  x-ray and CT scan     Results for orders placed or performed during the hospital encounter of 95/09/32  Basic metabolic panel  Result Value Ref Range   Sodium 137 135 - 145 mmol/L   Potassium 4.0 3.5 - 5.1 mmol/L   Chloride 100 96 - 112 mmol/L   CO2 31 19 - 32 mmol/L   Glucose, Bld 151 (H) 70 - 99 mg/dL   BUN 22 6 - 23 mg/dL   Creatinine, Ser 0.96 0.50 - 1.10 mg/dL   Calcium 8.7 8.4 - 10.5 mg/dL   GFR calc non Af Amer 53 (L) >90 mL/min   GFR calc Af Amer 62 (L) >90 mL/min   Anion gap 6 5 - 15  CBC with Differential  Result Value Ref Range   WBC 7.8 4.0 - 10.5 K/uL   RBC 4.41 3.87 - 5.11 MIL/uL   Hemoglobin 12.5 12.0 - 15.0 g/dL   HCT 40.6 36.0 - 46.0 %   MCV 92.1 78.0 - 100.0 fL   MCH 28.3 26.0 - 34.0 pg   MCHC 30.8 30.0 - 36.0 g/dL   RDW 13.8 11.5 - 15.5 %   Platelets 272 150 - 400 K/uL   Neutrophils Relative % 77 43 - 77 %   Neutro Abs 6.0 1.7 - 7.7 K/uL   Lymphocytes Relative 15 12 - 46 %   Lymphs Abs 1.1 0.7 - 4.0 K/uL   Monocytes Relative 5 3 - 12 %   Monocytes Absolute 0.4 0.1 - 1.0 K/uL   Eosinophils Relative 3 0 - 5 %   Eosinophils Absolute 0.3 0.0 - 0.7 K/uL   Basophils Relative 0 0 - 1 %   Basophils Absolute 0.0 0.0 - 0.1 K/uL  Troponin I  Result Value Ref Range   Troponin I <0.03 <0.031 ng/mL  Protime-INR  Result Value Ref Range   Prothrombin Time 12.4 11.6 - 15.2 seconds   INR 0.91 0.00 - 1.49   Dg Chest 1 View 04/05/2015   CLINICAL DATA:  Preoperative evaluation, hip fracture, history COPD, Hashimoto's disease, cor pulmonale, lung mass post radiation, diabetes  EXAM: CHEST  1 VIEW  COMPARISON:  03/22/2015  FINDINGS: Normal heart size, mediastinal contours and pulmonary vascularity.  Atherosclerotic calcifications aorta.  Emphysematous and bronchitic changes consistent with COPD.  Scattered interstitial prominence in mid to lower lungs.  Vague LEFT upper lobe density again identified, increased versus previous exam, though slightly more similar to the earlier  study of 06/08/2014  No acute infiltrate, pleural effusion or pneumothorax.  Bones demineralized.  IMPRESSION: COPD changes with scattered  interstitial disease and a vague persistent area of increased attenuation in the LEFT upper lobe question slightly increased in counts acuity; recommend CT chest to exclude recurrent nodule.   Electronically Signed   By: Lavonia Dana M.D.   On: 04/05/2015 13:16   Ct Head Wo Contrast 04/05/2015   CLINICAL DATA:  Tripped and fall with injury to posterior skull. Headache and neck pain after injury. Initial encounter.  EXAM: CT HEAD WITHOUT CONTRAST  CT CERVICAL SPINE WITHOUT CONTRAST  TECHNIQUE: Multidetector CT imaging of the head and cervical spine was performed following the standard protocol without intravenous contrast. Multiplanar CT image reconstructions of the cervical spine were also generated.  COMPARISON:  None.  FINDINGS: CT HEAD FINDINGS  There is evidence of small vessel disease in the periventricular white matter. The brain demonstrates no evidence of hemorrhage, infarction, edema, mass effect, extra-axial fluid collection, hydrocephalus or mass lesion. The skull is unremarkable and shows no evidence of fracture.  CT CERVICAL SPINE FINDINGS  The cervical spine shows normal alignment. There is no evidence of acute fracture or subluxation. No soft tissue swelling or hematoma is identified. No bony or soft tissue lesions are seen. Diffuse spondylosis of the cervical spine present at nearly every level. There is an associated mild anterolisthesis of C4 on C5 which is likely degenerative and measures approximately 3 mm. No evidence of acute fracture, traumatic subluxation or soft tissue swelling. The visualized airway is normally patent. Visualized lung apices demonstrate evidence of emphysema. There is incidental calcified plaque noted at both carotid bifurcations.  IMPRESSION: No acute head injury or cervical spine injury. Evidence of small vessel disease in the  periventricular white matter. Diffuse spondylosis of the cervical spine present. Incidental carotid atherosclerosis.   Electronically Signed   By: Aletta Edouard M.D.   On: 04/05/2015 12:18   Ct Cervical Spine Wo Contrast 04/05/2015   CLINICAL DATA:  Tripped and fall with injury to posterior skull. Headache and neck pain after injury. Initial encounter.  EXAM: CT HEAD WITHOUT CONTRAST  CT CERVICAL SPINE WITHOUT CONTRAST  TECHNIQUE: Multidetector CT imaging of the head and cervical spine was performed following the standard protocol without intravenous contrast. Multiplanar CT image reconstructions of the cervical spine were also generated.  COMPARISON:  None.  FINDINGS: CT HEAD FINDINGS  There is evidence of small vessel disease in the periventricular white matter. The brain demonstrates no evidence of hemorrhage, infarction, edema, mass effect, extra-axial fluid collection, hydrocephalus or mass lesion. The skull is unremarkable and shows no evidence of fracture.  CT CERVICAL SPINE FINDINGS  The cervical spine shows normal alignment. There is no evidence of acute fracture or subluxation. No soft tissue swelling or hematoma is identified. No bony or soft tissue lesions are seen. Diffuse spondylosis of the cervical spine present at nearly every level. There is an associated mild anterolisthesis of C4 on C5 which is likely degenerative and measures approximately 3 mm. No evidence of acute fracture, traumatic subluxation or soft tissue swelling. The visualized airway is normally patent. Visualized lung apices demonstrate evidence of emphysema. There is incidental calcified plaque noted at both carotid bifurcations.  IMPRESSION: No acute head injury or cervical spine injury. Evidence of small vessel disease in the periventricular white matter. Diffuse spondylosis of the cervical spine present. Incidental carotid atherosclerosis.   Electronically Signed   By: Aletta Edouard M.D.   On: 04/05/2015 12:18   Dg Shoulder  Left 04/05/2015   CLINICAL DATA:  [place] this morning over shoe box  in her living room, put her LEFT arm out to brace the fall, LEFT shoulder pain  EXAM: LEFT SHOULDER - 2+ VIEW  COMPARISON:  None  FINDINGS: Mild osseous demineralization.  AC joint alignment normal.  Glenohumeral joint alignment normal.  No acute fracture, dislocation, or bone destruction.  Visualized LEFT ribs intact.  IMPRESSION: Osseous demineralization without acute abnormalities.   Electronically Signed   By: Lavonia Dana M.D.   On: 04/05/2015 13:10   Dg Hip Unilat With Pelvis 2-3 Views Left 04/05/2015   CLINICAL DATA:  Tripped over a shoe box and fell backwards, LEFT hip pain  EXAM: LEFT HIP (WITH PELVIS) 2-3 VIEWS  COMPARISON:  None  FINDINGS: Diffuse osseous demineralization.  Minimal narrowing of the hips joints bilaterally.  SI joints symmetric.  Minimally displaced subcapital fracture LEFT femoral neck.  No dislocation.  Pelvis appears intact.  Scattered atherosclerotic calcifications and few pelvic phleboliths.  Slight prominent stool in rectum.  IMPRESSION: Osseous demineralization with a minimally displaced subcapital fracture LEFT femoral neck.   Electronically Signed   By: Lavonia Dana M.D.   On: 04/05/2015 10:55    1120:  Left hip fx. T/C to Ortho Dr. Percell Miller, case discussed, including:  HPI, pertinent PM/SHx, VS/PE, dx testing, ED course and treatment:  Agreeable to consult, requests to admit to medicine service and have them transfer pt to [facility].   1345:  No midline CS tenderness, FROM CS without midline tenderness. No NMS changes.  C-collar removed.  Pt is talking on her cellphone, resps easy, NAD. Dx and testing d/w pt.  Questions answered.  Verb understanding, agreeable to admit. T/C to [facility]. Wynelle Cleveland, case discussed, including:  HPI, pertinent PM/SHx, VS/PE, dx testing, ED course and treatment:  Agreeable to transfer/admit, requests to write temporary orders, obtain medical bed at [facility].     Francine Graven, DO 04/07/15  1705

## 2015-04-06 ENCOUNTER — Inpatient Hospital Stay (HOSPITAL_COMMUNITY): Payer: Medicare HMO

## 2015-04-06 ENCOUNTER — Inpatient Hospital Stay (HOSPITAL_COMMUNITY): Payer: Medicare HMO | Admitting: Anesthesiology

## 2015-04-06 ENCOUNTER — Encounter (HOSPITAL_COMMUNITY)
Admission: EM | Disposition: A | Discharge status: Skilled Nursing Facility | Payer: Self-pay | Source: Home / Self Care | Attending: Internal Medicine

## 2015-04-06 DIAGNOSIS — I2781 Cor pulmonale (chronic): Secondary | ICD-10-CM

## 2015-04-06 DIAGNOSIS — E119 Type 2 diabetes mellitus without complications: Secondary | ICD-10-CM

## 2015-04-06 DIAGNOSIS — S72002D Fracture of unspecified part of neck of left femur, subsequent encounter for closed fracture with routine healing: Secondary | ICD-10-CM

## 2015-04-06 HISTORY — PX: HIP PINNING,CANNULATED: SHX1758

## 2015-04-06 LAB — BASIC METABOLIC PANEL
Anion gap: 8 (ref 5–15)
BUN: 20 mg/dL (ref 6–23)
CHLORIDE: 102 mmol/L (ref 96–112)
CO2: 29 mmol/L (ref 19–32)
CREATININE: 1.11 mg/dL — AB (ref 0.50–1.10)
Calcium: 8.7 mg/dL (ref 8.4–10.5)
GFR calc non Af Amer: 45 mL/min — ABNORMAL LOW (ref >90)
GFR, EST AFRICAN AMERICAN: 52 mL/min — AB (ref >90)
Glucose, Bld: 73 mg/dL (ref 70–99)
Potassium: 4.1 mmol/L (ref 3.5–5.1)
Sodium: 139 mmol/L (ref 135–145)

## 2015-04-06 LAB — GLUCOSE, CAPILLARY
GLUCOSE-CAPILLARY: 118 mg/dL — AB (ref 70–99)
GLUCOSE-CAPILLARY: 127 mg/dL — AB (ref 70–99)
Glucose-Capillary: 75 mg/dL (ref 70–99)
Glucose-Capillary: 209 mg/dL — ABNORMAL HIGH (ref 70–99)

## 2015-04-06 LAB — CBC
HEMATOCRIT: 39.1 % (ref 36.0–46.0)
HEMOGLOBIN: 12.8 g/dL (ref 12.0–15.0)
MCH: 29.1 pg (ref 26.0–34.0)
MCHC: 32.7 g/dL (ref 30.0–36.0)
MCV: 88.9 fL (ref 78.0–100.0)
Platelets: 329 10*3/uL (ref 150–400)
RBC: 4.4 MIL/uL (ref 3.87–5.11)
RDW: 14 % (ref 11.5–15.5)
WBC: 9.4 10*3/uL (ref 4.0–10.5)

## 2015-04-06 SURGERY — FIXATION, FEMUR, NECK, PERCUTANEOUS, USING SCREW
Anesthesia: General | Site: Hip | Laterality: Left

## 2015-04-06 MED ORDER — FENTANYL CITRATE 0.05 MG/ML IJ SOLN
25.0000 ug | INTRAMUSCULAR | Status: DC | PRN
  Administered 2015-04-06 (×2): 25 ug via INTRAVENOUS

## 2015-04-06 MED ORDER — ALBUTEROL SULFATE (2.5 MG/3ML) 0.083% IN NEBU
2.5000 mg | INHALATION_SOLUTION | RESPIRATORY_TRACT | Status: DC | PRN
Start: 2015-04-06 — End: 2015-04-08

## 2015-04-06 MED ORDER — DOCUSATE SODIUM 100 MG PO CAPS: 100.0000 mg | ORAL_CAPSULE | Freq: Two times a day (BID) | ORAL | Status: AC

## 2015-04-06 MED ORDER — ONDANSETRON HCL 4 MG/2ML IJ SOLN
INTRAMUSCULAR | Status: AC
  Filled 2015-04-06: qty 2

## 2015-04-06 MED ORDER — FENTANYL CITRATE 0.05 MG/ML IJ SOLN
INTRAMUSCULAR | Status: AC
  Filled 2015-04-06: qty 5

## 2015-04-06 MED ORDER — LIDOCAINE HCL (CARDIAC) 20 MG/ML IV SOLN
INTRAVENOUS | Status: AC
  Filled 2015-04-06: qty 5

## 2015-04-06 MED ORDER — PHENYLEPHRINE HCL 10 MG/ML IJ SOLN
10.0000 mg | INTRAVENOUS | Status: DC | PRN
  Administered 2015-04-06: 15 ug/min via INTRAVENOUS

## 2015-04-06 MED ORDER — LIDOCAINE HCL (CARDIAC) 20 MG/ML IV SOLN
INTRAVENOUS | Status: DC | PRN
  Administered 2015-04-06: 50 mg via INTRAVENOUS

## 2015-04-06 MED ORDER — ONDANSETRON HCL 4 MG/2ML IJ SOLN
INTRAMUSCULAR | Status: DC | PRN
  Administered 2015-04-06: 4 mg via INTRAVENOUS

## 2015-04-06 MED ORDER — PROMETHAZINE HCL 25 MG/ML IJ SOLN: 6.2500 mg | INTRAMUSCULAR | Status: DC | PRN

## 2015-04-06 MED ORDER — FENTANYL CITRATE 0.05 MG/ML IJ SOLN
INTRAMUSCULAR | Status: AC
  Filled 2015-04-06: qty 2

## 2015-04-06 MED ORDER — PROPOFOL 10 MG/ML IV BOLUS
INTRAVENOUS | Status: AC
  Filled 2015-04-06: qty 20

## 2015-04-06 MED ORDER — FENTANYL CITRATE 0.05 MG/ML IJ SOLN
INTRAMUSCULAR | Status: DC | PRN
  Administered 2015-04-06 (×2): 25 ug via INTRAVENOUS

## 2015-04-06 MED ORDER — MEPERIDINE HCL 25 MG/ML IJ SOLN: 6.2500 mg | INTRAMUSCULAR | Status: DC | PRN

## 2015-04-06 MED ORDER — POTASSIUM CHLORIDE IN NACL 20-0.45 MEQ/L-% IV SOLN
INTRAVENOUS | Status: DC
  Filled 2015-04-06 (×2): qty 1000

## 2015-04-06 MED ORDER — CEFAZOLIN SODIUM-DEXTROSE 2-3 GM-% IV SOLR
2.0000 g | INTRAVENOUS | Status: AC
  Filled 2015-04-06: qty 50

## 2015-04-06 MED ORDER — HYDROCODONE-ACETAMINOPHEN 5-325 MG PO TABS
1.0000 | ORAL_TABLET | Freq: Four times a day (QID) | ORAL | Status: DC | PRN
  Administered 2015-04-06 – 2015-04-07 (×2): 1 via ORAL
  Filled 2015-04-06 (×2): qty 1

## 2015-04-06 MED ORDER — ALBUTEROL SULFATE (2.5 MG/3ML) 0.083% IN NEBU: 2.5000 mg | INHALATION_SOLUTION | Freq: Four times a day (QID) | RESPIRATORY_TRACT | Status: DC

## 2015-04-06 MED ORDER — HYDROCODONE-ACETAMINOPHEN 5-325 MG PO TABS: 1.0000 | ORAL_TABLET | Freq: Four times a day (QID) | ORAL | Status: DC | PRN

## 2015-04-06 MED ORDER — ASPIRIN EC 325 MG PO TBEC
325.0000 mg | DELAYED_RELEASE_TABLET | Freq: Every day | ORAL | Status: DC
  Administered 2015-04-07 – 2015-04-09 (×3): 325 mg via ORAL
  Filled 2015-04-06 (×3): qty 1

## 2015-04-06 MED ORDER — CHLORHEXIDINE GLUCONATE 4 % EX LIQD
60.0000 mL | Freq: Once | CUTANEOUS | Status: AC
  Administered 2015-04-06: 4 via TOPICAL
  Filled 2015-04-06: qty 60

## 2015-04-06 MED ORDER — MENTHOL 3 MG MT LOZG: 1.0000 | LOZENGE | OROMUCOSAL | Status: DC | PRN

## 2015-04-06 MED ORDER — PROPOFOL 10 MG/ML IV BOLUS
INTRAVENOUS | Status: DC | PRN
  Administered 2015-04-06: 110 mg via INTRAVENOUS

## 2015-04-06 MED ORDER — PHENOL 1.4 % MT LIQD: 1.0000 | OROMUCOSAL | Status: DC | PRN

## 2015-04-06 MED ORDER — CEFAZOLIN SODIUM-DEXTROSE 2-3 GM-% IV SOLR
INTRAVENOUS | Status: DC | PRN
  Administered 2015-04-06: 2 g via INTRAVENOUS

## 2015-04-06 MED ORDER — LACTATED RINGERS IV SOLN
INTRAVENOUS | Status: DC | PRN
  Administered 2015-04-06: 07:00:00 via INTRAVENOUS

## 2015-04-06 MED ORDER — CEFAZOLIN SODIUM-DEXTROSE 2-3 GM-% IV SOLR
2.0000 g | Freq: Four times a day (QID) | INTRAVENOUS | Status: AC
  Administered 2015-04-06 (×2): 2 g via INTRAVENOUS
  Filled 2015-04-06 (×2): qty 50

## 2015-04-06 MED ORDER — FUROSEMIDE 40 MG PO TABS
40.0000 mg | ORAL_TABLET | Freq: Every day | ORAL | Status: DC
  Administered 2015-04-06 – 2015-04-09 (×4): 40 mg via ORAL
  Filled 2015-04-06 (×4): qty 1

## 2015-04-06 MED ORDER — PHENYLEPHRINE HCL 10 MG/ML IJ SOLN
INTRAMUSCULAR | Status: DC | PRN
  Administered 2015-04-06 (×4): 80 ug via INTRAVENOUS

## 2015-04-06 MED ORDER — IPRATROPIUM-ALBUTEROL 0.5-2.5 (3) MG/3ML IN SOLN
3.0000 mL | Freq: Three times a day (TID) | RESPIRATORY_TRACT | Status: DC
  Administered 2015-04-06 – 2015-04-08 (×5): 3 mL via RESPIRATORY_TRACT
  Filled 2015-04-06 (×5): qty 3

## 2015-04-06 MED ORDER — ROCURONIUM BROMIDE 50 MG/5ML IV SOLN
INTRAVENOUS | Status: AC
  Filled 2015-04-06: qty 1

## 2015-04-06 MED ORDER — HYDRALAZINE HCL 20 MG/ML IJ SOLN: 10.0000 mg | Freq: Four times a day (QID) | INTRAMUSCULAR | Status: DC | PRN

## 2015-04-06 MED ORDER — ACETAMINOPHEN 500 MG PO TABS: 1000.0000 mg | ORAL_TABLET | Freq: Once | ORAL | Status: AC

## 2015-04-06 MED ORDER — ASPIRIN 325 MG PO TABS: 325.0000 mg | ORAL_TABLET | Freq: Every day | ORAL | Status: DC

## 2015-04-06 SURGICAL SUPPLY — 37 items
BIT DRILL 4.9 CANNULATED (BIT) ×1
BIT DRILL CANN QC 4.9 LRG (BIT) IMPLANT
BNDG COHESIVE 4X5 TAN STRL (GAUZE/BANDAGES/DRESSINGS) ×2 IMPLANT
COVER PERINEAL POST (MISCELLANEOUS) ×2 IMPLANT
COVER SURGICAL LIGHT HANDLE (MISCELLANEOUS) ×3 IMPLANT
DRAPE STERI IOBAN 125X83 (DRAPES) ×2 IMPLANT
DRILL BIT CANNULATED 4.9 (BIT) ×3
DRSG EMULSION OIL 3X3 NADH (GAUZE/BANDAGES/DRESSINGS) ×3 IMPLANT
DRSG MEPILEX BORDER 4X4 (GAUZE/BANDAGES/DRESSINGS) ×2 IMPLANT
DURAPREP 26ML APPLICATOR (WOUND CARE) ×3 IMPLANT
ELECT REM PT RETURN 9FT ADLT (ELECTROSURGICAL) ×3
ELECTRODE REM PT RTRN 9FT ADLT (ELECTROSURGICAL) ×1 IMPLANT
GAUZE SPONGE 4X4 12PLY STRL (GAUZE/BANDAGES/DRESSINGS) ×3 IMPLANT
GLOVE BIO SURGEON STRL SZ7 (GLOVE) ×3 IMPLANT
GLOVE BIO SURGEON STRL SZ7.5 (GLOVE) ×6 IMPLANT
GLOVE BIOGEL PI IND STRL 7.0 (GLOVE) ×1 IMPLANT
GLOVE BIOGEL PI IND STRL 8 (GLOVE) ×1 IMPLANT
GLOVE BIOGEL PI INDICATOR 7.0 (GLOVE) ×2
GLOVE BIOGEL PI INDICATOR 8 (GLOVE) ×2
GLOVE SURG SS PI 8.0 STRL IVOR (GLOVE) ×2 IMPLANT
GOWN STRL REUS W/ TWL LRG LVL3 (GOWN DISPOSABLE) ×1 IMPLANT
GOWN STRL REUS W/TWL LRG LVL3 (GOWN DISPOSABLE) ×3
GUIDEWIRE THRD ASNIS 3.2X300 (WIRE) ×6 IMPLANT
KIT BASIN OR (CUSTOM PROCEDURE TRAY) ×3 IMPLANT
KIT ROOM TURNOVER OR (KITS) ×3 IMPLANT
NS IRRIG 1000ML POUR BTL (IV SOLUTION) ×3 IMPLANT
PACK GENERAL/GYN (CUSTOM PROCEDURE TRAY) ×3 IMPLANT
PAD ARMBOARD 7.5X6 YLW CONV (MISCELLANEOUS) ×6 IMPLANT
SCREW ASNIS 85MM (Screw) ×2 IMPLANT
SCREW ASNIS 90MM (Screw) ×2 IMPLANT
SCREW CANN 6.5X80 STRL (Screw) ×2 IMPLANT
SUT MON AB 2-0 CT1 36 (SUTURE) ×3 IMPLANT
SUT VIC AB 0 CT1 27 (SUTURE) ×3
SUT VIC AB 0 CT1 27XBRD ANBCTR (SUTURE) ×1 IMPLANT
TOWEL OR 17X24 6PK STRL BLUE (TOWEL DISPOSABLE) ×3 IMPLANT
TOWEL OR 17X26 10 PK STRL BLUE (TOWEL DISPOSABLE) ×3 IMPLANT
WATER STERILE IRR 1000ML POUR (IV SOLUTION) ×3 IMPLANT

## 2015-04-06 NOTE — Progress Notes (Signed)
Patient Demographics  Jennifer Duran, is a 81 y.o. female, DOB - 08-08-1929, GYF:[id number]  Admit date - 04/05/2015   Admitting Physician Renette Butters, MD  Outpatient Primary MD for the patient is No primary care provider on file.  LOS - 1   Chief Complaint  Patient presents with  . Fall        Subjective:   Jennifer Duran today has, No headache, No chest pain, No abdominal pain - No Nausea, No new weakness tingling or numbness, No Cough - SOB. Mild L hip pain.  Assessment & Plan    1. Mechanical Fall with  Hip fracture, left - post ORIF 04-06-15, ASA-Plavix for DVT prophylaxis per Ortho, WBAT,. PT and supportive Rx.   2. COPD - on 2lit [place] o2 , Nebs scheduled and as needed, continue.   3.HTN - PRN hydrlazine and monitor, she is off of her ACE x 1 week.   4.DM2 - on lantus + ISS  CBG (last 3)   Recent Labs  04/05/15 1635 04/05/15 2112 04/06/15 0848  GLUCAP 81 119* 75    5.Anxiety and depression - home Meds.   6. Cor pulmonale - continue Lasix home dose, o2.    7. H/o PET positive nodule - pulm follow up suggested. Also following with Dr Luretha Rued.     Code Status: DNR  Family Communication: none  Disposition Plan: TBD   Procedures   ORIF L Hip 04-06-15   Consults  Ortho   Medications  Scheduled Meds: . albuterol  2.5 mg Nebulization Q6H  . [START ON 04/07/2015] aspirin EC  325 mg Oral Q breakfast  .  ceFAZolin (ANCEF) IV  2 g Intravenous Q6H  . Chlorhexidine Gluconate Cloth  6 each Topical Q0600  . citalopram  20 mg Oral Daily  . clonazePAM  0.25 mg Oral Q breakfast  . clonazePAM  0.5 mg Oral QHS  . clopidogrel  75 mg Oral Q breakfast  . fentaNYL      . ferrous sulfate  325 mg Oral Daily  . insulin aspart  0-5 Units Subcutaneous QHS  . insulin aspart   0-9 Units Subcutaneous TID WC  . insulin glargine  5 Units Subcutaneous QHS  . loratadine  10 mg Oral Daily  . mometasone-formoterol  2 puff Inhalation BID  . mupirocin ointment  1 application Nasal BID  . sodium chloride  3 mL Intravenous Q12H  . tiotropium  18 mcg Inhalation Daily  . traMADol  100 mg Oral QHS   Continuous Infusions:  PRN Meds:.sodium chloride, acetaminophen **OR** acetaminophen, albuterol, alum & mag hydroxide-simeth, HYDROcodone-acetaminophen, ipratropium, menthol-cetylpyridinium **OR** phenol, morphine injection, morphine injection, ondansetron **OR** ondansetron (ZOFRAN) IV, oxyCODONE, sodium chloride  DVT Prophylaxis Plavix and ASA  Lab Results  Component Value Date   PLT 329 04/06/2015    Antibiotics     Anti-infectives    Start     Dose/Rate Route Frequency Ordered Stop   04/06/15 1300  ceFAZolin (ANCEF) IVPB 2 g/50 mL premix     2 g 100 mL/hr over 30 Minutes Intravenous Every 6 hours 04/06/15 1019 04/07/15 0059   04/06/15 1030  ceFAZolin (ANCEF) IVPB 2 g/50 mL premix     2 g 100 mL/hr over 30 Minutes  Intravenous On call to O.R. 04/06/15 1020 04/06/15 1107          Objective:   Filed Vitals:   04/06/15 0955 04/06/15 1000 04/06/15 1010 04/06/15 1032  BP: 137/49   140/44  Pulse: 84 88  96  Temp:   98.3 F (36.8 C) 98.2 F (36.8 C)  TempSrc:    Oral  Resp: 20 13  20   Weight:      SpO2: 100% 98%      Wt Readings from Last 3 Encounters:  04/05/15 56.246 kg (124 lb)  03/22/15 56.79 kg (125 lb 3.2 oz)  09/21/14 53.706 kg (118 lb 6.4 oz)     Intake/Output Summary (Last 24 hours) at 04/06/15 1115 Last data filed at 04/06/15 1000  Gross per 24 hour  Intake    600 ml  Output      0 ml  Net    600 ml     Physical Exam  Awake Alert, Oriented X 3, No new F.N deficits, Normal affect [place].AT,PERRAL Supple Neck,No JVD, No cervical lymphadenopathy appriciated.  Symmetrical Chest wall movement, Good air movement bilaterally, CTAB RRR,No  Gallops,Rubs or new Murmurs, No Parasternal Heave +ve B.Sounds, Abd Soft, No tenderness, No organomegaly appriciated, No rebound - guarding or rigidity. No Cyanosis, Clubbing or edema, No new Rash or bruise      Data Review   Micro Results Recent Results (from the past 240 hour(s))  Surgical pcr screen     Status: Abnormal   Collection Time: 04/05/15  4:00 PM  Result Value Ref Range Status   MRSA, PCR POSITIVE (A) NEGATIVE Final   Staphylococcus aureus POSITIVE (A) NEGATIVE Final    Comment:        The Xpert SA Assay (FDA approved for NASAL specimens in patients over 45 years of age), is one component of a comprehensive surveillance program.  Test performance has been validated by [facility] for patients greater than or equal to 89 year old. It is not intended to diagnose infection nor to guide or monitor treatment.     Radiology Reports Dg Chest 1 View  04/05/2015   CLINICAL DATA:  Preoperative evaluation, hip fracture, history COPD, Hashimoto's disease, cor pulmonale, lung mass post radiation, diabetes  EXAM: CHEST  1 VIEW  COMPARISON:  03/22/2015  FINDINGS: Normal heart size, mediastinal contours and pulmonary vascularity.  Atherosclerotic calcifications aorta.  Emphysematous and bronchitic changes consistent with COPD.  Scattered interstitial prominence in mid to lower lungs.  Vague LEFT upper lobe density again identified, increased versus previous exam, though slightly more similar to the earlier study of 06/08/2014  No acute infiltrate, pleural effusion or pneumothorax.  Bones demineralized.  IMPRESSION: COPD changes with scattered interstitial disease and a vague persistent area of increased attenuation in the LEFT upper lobe question slightly increased in counts acuity; recommend CT chest to exclude recurrent nodule.   Electronically Signed   By: Lavonia Dana M.D.   On: 04/05/2015 13:16   Dg Chest 2 View  03/22/2015   CLINICAL DATA:  Shortness of breath.  EXAM: CHEST  2 VIEW   COMPARISON:  CT 08/08/2014.  Chest x-ray 06/08/2014, 02/25/ 2015.  FINDINGS: Mediastinum and hilar structures are normal. Stable apical pleural parenchymal thickening consistent with scarring. Stable interstitial prominence noted consistent with chronic interstitial lung disease. COPD. Heart size normal. No acute bony abnormality.  IMPRESSION: 1. Stable biapical pleural parenchymal scarring. No recurrent mass. Left apical density is unchanged. 2. Chronic interstitial lung disease and COPD.  Electronically Signed   By: Marcello Moores  Register   On: 03/22/2015 14:05   Ct Head Wo Contrast  04/05/2015   CLINICAL DATA:  Tripped and fall with injury to posterior skull. Headache and neck pain after injury. Initial encounter.  EXAM: CT HEAD WITHOUT CONTRAST  CT CERVICAL SPINE WITHOUT CONTRAST  TECHNIQUE: Multidetector CT imaging of the head and cervical spine was performed following the standard protocol without intravenous contrast. Multiplanar CT image reconstructions of the cervical spine were also generated.  COMPARISON:  None.  FINDINGS: CT HEAD FINDINGS  There is evidence of small vessel disease in the periventricular white matter. The brain demonstrates no evidence of hemorrhage, infarction, edema, mass effect, extra-axial fluid collection, hydrocephalus or mass lesion. The skull is unremarkable and shows no evidence of fracture.  CT CERVICAL SPINE FINDINGS  The cervical spine shows normal alignment. There is no evidence of acute fracture or subluxation. No soft tissue swelling or hematoma is identified. No bony or soft tissue lesions are seen. Diffuse spondylosis of the cervical spine present at nearly every level. There is an associated mild anterolisthesis of C4 on C5 which is likely degenerative and measures approximately 3 mm. No evidence of acute fracture, traumatic subluxation or soft tissue swelling. The visualized airway is normally patent. Visualized lung apices demonstrate evidence of emphysema. There is  incidental calcified plaque noted at both carotid bifurcations.  IMPRESSION: No acute head injury or cervical spine injury. Evidence of small vessel disease in the periventricular white matter. Diffuse spondylosis of the cervical spine present. Incidental carotid atherosclerosis.   Electronically Signed   By: Aletta Edouard M.D.   On: 04/05/2015 12:18   Ct Cervical Spine Wo Contrast  04/05/2015   CLINICAL DATA:  Tripped and fall with injury to posterior skull. Headache and neck pain after injury. Initial encounter.  EXAM: CT HEAD WITHOUT CONTRAST  CT CERVICAL SPINE WITHOUT CONTRAST  TECHNIQUE: Multidetector CT imaging of the head and cervical spine was performed following the standard protocol without intravenous contrast. Multiplanar CT image reconstructions of the cervical spine were also generated.  COMPARISON:  None.  FINDINGS: CT HEAD FINDINGS  There is evidence of small vessel disease in the periventricular white matter. The brain demonstrates no evidence of hemorrhage, infarction, edema, mass effect, extra-axial fluid collection, hydrocephalus or mass lesion. The skull is unremarkable and shows no evidence of fracture.  CT CERVICAL SPINE FINDINGS  The cervical spine shows normal alignment. There is no evidence of acute fracture or subluxation. No soft tissue swelling or hematoma is identified. No bony or soft tissue lesions are seen. Diffuse spondylosis of the cervical spine present at nearly every level. There is an associated mild anterolisthesis of C4 on C5 which is likely degenerative and measures approximately 3 mm. No evidence of acute fracture, traumatic subluxation or soft tissue swelling. The visualized airway is normally patent. Visualized lung apices demonstrate evidence of emphysema. There is incidental calcified plaque noted at both carotid bifurcations.  IMPRESSION: No acute head injury or cervical spine injury. Evidence of small vessel disease in the periventricular white matter. Diffuse  spondylosis of the cervical spine present. Incidental carotid atherosclerosis.   Electronically Signed   By: Aletta Edouard M.D.   On: 04/05/2015 12:18   Dg Shoulder Left  04/05/2015   CLINICAL DATA:  [place] this morning over shoe box in her living room, put her LEFT arm out to brace the fall, LEFT shoulder pain  EXAM: LEFT SHOULDER - 2+ VIEW  COMPARISON:  None  FINDINGS: Mild osseous demineralization.  AC joint alignment normal.  Glenohumeral joint alignment normal.  No acute fracture, dislocation, or bone destruction.  Visualized LEFT ribs intact.  IMPRESSION: Osseous demineralization without acute abnormalities.   Electronically Signed   By: Lavonia Dana M.D.   On: 04/05/2015 13:10   Dg Hip Operative Unilat With Pelvis Left  04/06/2015   CLINICAL DATA:  Left hip surgery.  EXAM: OPERATIVE left HIP (WITH PELVIS IF PERFORMED) 2 VIEWS  TECHNIQUE: Fluoroscopic spot image(s) were submitted for interpretation post-operatively.  FLUOROSCOPY TIME:  Fluoroscopy Time:  0 min, 24 seconds  Number of Acquired Images:  2  COMPARISON:  None.  FINDINGS: Patient status post open reduction internal fixation of left femoral neck fracture. Good anatomic alignment. Hardware intact.  IMPRESSION: Open reduction internal fixation of left femoral neck fracture with good anatomic alignment.   Electronically Signed   By: Marcello Moores  Register   On: 04/06/2015 08:34   Dg Hip Unilat With Pelvis 2-3 Views Left  04/05/2015   CLINICAL DATA:  Tripped over a shoe box and fell backwards, LEFT hip pain  EXAM: LEFT HIP (WITH PELVIS) 2-3 VIEWS  COMPARISON:  None  FINDINGS: Diffuse osseous demineralization.  Minimal narrowing of the hips joints bilaterally.  SI joints symmetric.  Minimally displaced subcapital fracture LEFT femoral neck.  No dislocation.  Pelvis appears intact.  Scattered atherosclerotic calcifications and few pelvic phleboliths.  Slight prominent stool in rectum.  IMPRESSION: Osseous demineralization with a minimally displaced  subcapital fracture LEFT femoral neck.   Electronically Signed   By: Lavonia Dana M.D.   On: 04/05/2015 10:55     CBC  Recent Labs Lab 04/05/15 1133 04/06/15 0452  WBC 7.8 9.4  HGB 12.5 12.8  HCT 40.6 39.1  PLT 272 329  MCV 92.1 88.9  MCH 28.3 29.1  MCHC 30.8 32.7  RDW 13.8 14.0  LYMPHSABS 1.1  --   MONOABS 0.4  --   EOSABS 0.3  --   BASOSABS 0.0  --     Chemistries   Recent Labs Lab 04/05/15 1133 04/06/15 0452  NA 137 139  K 4.0 4.1  CL 100 102  CO2 31 29  GLUCOSE 151* 73  BUN 22 20  CREATININE 0.96 1.11*  CALCIUM 8.7 8.7   ------------------------------------------------------------------------------------------------------------------ estimated creatinine clearance is 31.8 mL/min (by C-G formula based on Cr of 1.11). ------------------------------------------------------------------------------------------------------------------ No results for input(s): HGBA1C in the last 72 hours. ------------------------------------------------------------------------------------------------------------------ No results for input(s): CHOL, HDL, LDLCALC, TRIG, CHOLHDL, LDLDIRECT in the last 72 hours. ------------------------------------------------------------------------------------------------------------------ No results for input(s): TSH, T4TOTAL, T3FREE, THYROIDAB in the last 72 hours.  Invalid input(s): FREET3 ------------------------------------------------------------------------------------------------------------------ No results for input(s): VITAMINB12, FOLATE, FERRITIN, TIBC, IRON, RETICCTPCT in the last 72 hours.  Coagulation profile  Recent Labs Lab 04/05/15 1133  INR 0.91    No results for input(s): DDIMER in the last 72 hours.  Cardiac Enzymes  Recent Labs Lab 04/05/15 1133  TROPONINI <0.03   ------------------------------------------------------------------------------------------------------------------ Invalid input(s): POCBNP     Time  Spent in minutes  35   Jennifer Duran K M.D on 04/06/2015 at 11:15 AM  Between 7am to 7pm - Pager - [phone]  After 7pm go to [email or web address] - password [facility]  [facility]   Office  [phone]

## 2015-04-06 NOTE — Transfer of Care (Signed)
Immediate Anesthesia Transfer of Care Note  Patient: Jennifer Duran  Procedure(s) Performed: Procedure(s): CANNULATED HIP PINNING (Left)  Patient Location: PACU  Anesthesia Type:General  Level of Consciousness: awake  Airway & Oxygen Therapy: Patient Spontanous Breathing and Patient connected to nasal cannula oxygen  Post-op Assessment: Report given to RN and Post -op Vital signs reviewed and stable  Post vital signs: Reviewed and stable  Last Vitals:  Filed Vitals:   04/06/15 0541  BP: 152/46  Pulse: 92  Temp: 36.6 C  Resp: 20    Complications: No apparent anesthesia complications

## 2015-04-06 NOTE — Op Note (Signed)
04/05/2015 - 04/06/2015  8:22 AM  PATIENT:  Jennifer Duran    PRE-OPERATIVE DIAGNOSIS:  LEFT HIP FRACTURE  POST-OPERATIVE DIAGNOSIS:  Same  PROCEDURE:  CANNULATED HIP PINNING  SURGEON:  Bexlee Bergdoll D, MD  ASSISTANT: Lovett Calender, PA-C, She was present and scrubbed throughout the case, critical for completion in a timely fashion, and for retraction, instrumentation, and closure.   ANESTHESIA:   General  PREOPERATIVE INDICATIONS:  Jennifer Duran is a  81 y.o. female who fell and was found to have a diagnosis of LEFT HIP FRACTURE who elected for surgical management.    The risks benefits and alternatives were discussed with the patient preoperatively including but not limited to the risks of infection, bleeding, nerve injury, cardiopulmonary complications, blood clots, malunion, nonunion, avascular necrosis, the need for revision surgery, the potential for conversion to hemiarthroplasty, among others, and the patient was willing to proceed.  OPERATIVE IMPLANTS: 6.5 mm cannulated screws x3  OPERATIVE FINDINGS: Clinical osteoporosis with weak bone, proximal femur  OPERATIVE PROCEDURE: The patient was brought to the operating room and placed in supine position. IV antibiotics were given. General anesthesia administered. Foley was also given. The patient was placed on the fracture table. The operative extremity was positioned, without any significant reduction maneuver and was prepped and draped in usual sterile fashion.  Time out was performed.  Small incisions were made distal to the greater trochanter, and 3 guidewires were introduced Into an inverted triangle configuration. The lengths were measured. The reduction was slightly valgus, and near-anatomic. I opened the cortex with a cannulated drill, and then placed the screws into position. Satisfactory fixation was achieved. I sequentially tightened the screws by hand.  I performed a live fluoroscopic exam and no screw penetrance  was noted. All threads crossed the fracture site.   The wounds were irrigated copiously, and repaired with Vicryl with Steri-Strips and sterile gauze. There no complications and the patient tolerated the procedure well.  The patient will be weightbearing as tolerated, VTE prophylaxis will be: Plavix and ASA 325   This note was generated using a template and dragon dictation system. In light of that, I have reviewed the note and all aspects of it are applicable to this case. Any dictation errors are due to the computerized dictation system.

## 2015-04-06 NOTE — Anesthesia Procedure Notes (Signed)
Procedure Name: LMA Insertion Date/Time: 04/06/2015 7:27 AM Performed by: Clearnce Sorrel Pre-anesthesia Checklist: Patient identified, Timeout performed, Emergency Drugs available, Suction available and Patient being monitored Patient Re-evaluated:Patient Re-evaluated prior to inductionOxygen Delivery Method: Circle system utilized Preoxygenation: Pre-oxygenation with 100% oxygen Intubation Type: IV induction LMA: LMA inserted LMA Size: 4.0 Grade View: Grade II Tube type: Oral Number of attempts: 1 Placement Confirmation: positive ETCO2 Tube secured with: Tape Dental Injury: Teeth and Oropharynx as per pre-operative assessment

## 2015-04-06 NOTE — Progress Notes (Signed)
Utilization review completed.  

## 2015-04-06 NOTE — Progress Notes (Signed)
Orthopedic Tech Progress Note Patient Details:  Jennifer Duran [birth year] [id number]  Patient ID: [name], female   DOB: 02-Aug-1929, 81 y.o.   MRN: [id number] Pt unable to use trapeze bar patient helper  Hildred Priest 04/06/2015, 11:00 AM

## 2015-04-06 NOTE — Interval H&P Note (Signed)
History and Physical Interval Note:  04/06/2015 7:03 AM  Jennifer Duran  has presented today for surgery, with the diagnosis of LEFT HIP FRACTURE  The various methods of treatment have been discussed with the patient and family. After consideration of risks, benefits and other options for treatment, the patient has consented to  Procedure(s): CANNULATED HIP PINNING (Left) as a surgical intervention .  The patient's history has been reviewed, patient examined, no change in status, stable for surgery.  I have reviewed the patient's chart and labs.  Questions were answered to the patient's satisfaction.     Amir Fick D

## 2015-04-06 NOTE — H&P (View-Only) (Signed)
ORTHOPAEDIC CONSULTATION  REQUESTING PHYSICIAN: Debbe Odea, MD  Chief Complaint: left hip pain  HPI: Jennifer Duran is a 81 y.o. female who complains of  Left hip pain after a mechanical fall today.  She was found to have a femoral neck fracture of the left hip in the [facility] ED.  She was transferred to [facility] for surgical correction tomorrow.  She lives at an independent living facility and is ambulatory with a walker at baseline.  Past Medical History  Diagnosis Date  . Allergic rhinitis, cause unspecified   . Unspecified essential hypertension   . Cor pulmonale   . COPD (chronic obstructive pulmonary disease)   . Osteoporosis   . Hashimoto's disease   . Peripheral vascular disease   . Hyperlipidemia   . Depression   . Urinary frequency   . Other atopic dermatitis and related conditions   . Abnormal involuntary movements(781.0)   . Hyposmolality and/or hyponatremia   . Peripheral vascular disease, unspecified   . Lung mass   . Insomnia   . History of radiation therapy 08/08/2013, 08/10/2013, 08/16/2013    SBRT 54 Gy to left upper lobe pulmonary nodule  . Type II or unspecified type diabetes mellitus without mention of complication, not stated as uncontrolled    Past Surgical History  Procedure Laterality Date  . Total abdominal hysterectomy    . Cataracts    . Rotator cuff repair    . Mastectomy Right 1952  . Eye surgery    . Radiation implant, female Left Aug. 2014    Left Breast  3 Tx's   History   Social History  . Marital Status: Widowed    Spouse Name: N/A  . Number of Children: N/A  . Years of Education: N/A   Social History Main Topics  . Smoking status: Former Smoker -- 2.00 packs/day for 53 years  . Smokeless tobacco: Never Used  . Alcohol Use: No  . Drug Use: No  . Sexual Activity: No   Other Topics Concern  . None   Social History Narrative   Family History  Problem Relation Age of Onset  . Cancer Mother     lung   . Lupus Father   .  Heart disease Sister    Allergies  Allergen Reactions  . Celecoxib Swelling  . Codeine Nausea And Vomiting  . Metronidazole Other (See Comments)    neuropathy  . Pregabalin Swelling  . Propranolol Hcl Other (See Comments)    Unknown  . Rofecoxib Other (See Comments)    Unknown  . Iodine Rash    Only if ingested  . Pregabalin Hives and Rash   Prior to Admission medications   Medication Sig Start Date End Date Taking? Authorizing Provider  albuterol (PROVENTIL HFA;VENTOLIN HFA) 108 (90 BASE) MCG/ACT inhaler Inhale 2 puffs into the lungs every 4 (four) hours as needed for wheezing or shortness of breath. 11/18/13  Yes Deneise Lever, MD  albuterol (PROVENTIL) (2.5 MG/3ML) 0.083% nebulizer solution Take 2.5 mg by nebulization every 6 (six) hours as needed for wheezing or shortness of breath.   Yes Historical Provider, MD  Cholecalciferol (VITAMIN D-3) 1000 UNITS CAPS Take 1 capsule by mouth daily.   Yes Historical Provider, MD  citalopram (CELEXA) 20 MG tablet Take 20 mg by mouth daily.   Yes Historical Provider, MD  clonazePAM (KLONOPIN) 0.5 MG tablet Take 0.25-0.5 mg by mouth 2 (two) times daily. 0.58m in the am 0.56min the PM 03/01/14  Yes Jessica K Eubanks, NP  clopidogrel (PLAVIX) 75 MG tablet Take 75 mg by mouth daily with breakfast.   Yes Historical Provider, MD  ferrous sulfate 325 (65 FE) MG tablet Take 325 mg by mouth daily.   Yes Historical Provider, MD  Fluticasone-Salmeterol (ADVAIR) 250-50 MCG/DOSE AEPB Inhale 1 puff into the lungs 2 (two) times daily.   Yes Historical Provider, MD  furosemide (LASIX) 40 MG tablet Take 40 mg by mouth daily.   Yes Historical Provider, MD  insulin aspart (NOVOLOG) 100 UNIT/ML injection Inject 5 Units into the skin 3 (three) times daily before meals. Give an additional 5 units if CBG > 150. Notify MD if CBG < 60 or > 40. Hold if BS < 70   Yes Historical Provider, MD  insulin glargine (LANTUS) 100 UNIT/ML injection Inject 10 Units into the skin  at bedtime.     Yes Historical Provider, MD  ipratropium (ATROVENT) 0.02 % nebulizer solution Take 0.5 mg by nebulization every 4 (four) hours as needed for wheezing or shortness of breath.   Yes Historical Provider, MD  lisinopril (PRINIVIL,ZESTRIL) 5 MG tablet Take 2.5 mg by mouth daily. HOLD if systolic blood pressure is less than 100 or diastolic BP is less than 60   Yes Historical Provider, MD  loratadine (CLARITIN) 10 MG tablet Take 10 mg by mouth daily.   Yes Historical Provider, MD  tiotropium (SPIRIVA) 18 MCG inhalation capsule Place 18 mcg into inhaler and inhale daily.   Yes Historical Provider, MD  traMADol (ULTRAM) 50 MG tablet Take 100 mg by mouth at bedtime. 03/15/14  Yes Jessica K Eubanks, NP  ondansetron (ZOFRAN ODT) 4 MG disintegrating tablet Take 1 tablet (4 mg total) by mouth every 8 (eight) hours as needed for nausea. Patient not taking: Reported on 03/22/2015 07/15/13   Robert Lockwood, MD   Dg Chest 1 View  04/05/2015   CLINICAL DATA:  Preoperative evaluation, hip fracture, history COPD, Hashimoto's disease, cor pulmonale, lung mass post radiation, diabetes  EXAM: CHEST  1 VIEW  COMPARISON:  03/22/2015  FINDINGS: Normal heart size, mediastinal contours and pulmonary vascularity.  Atherosclerotic calcifications aorta.  Emphysematous and bronchitic changes consistent with COPD.  Scattered interstitial prominence in mid to lower lungs.  Vague LEFT upper lobe density again identified, increased versus previous exam, though slightly more similar to the earlier study of 06/08/2014  No acute infiltrate, pleural effusion or pneumothorax.  Bones demineralized.  IMPRESSION: COPD changes with scattered interstitial disease and a vague persistent area of increased attenuation in the LEFT upper lobe question slightly increased in counts acuity; recommend CT chest to exclude recurrent nodule.   Electronically Signed   By: Mark  Boles M.D.   On: 04/05/2015 13:16   Ct Head Wo Contrast  04/05/2015    CLINICAL DATA:  Tripped and fall with injury to posterior skull. Headache and neck pain after injury. Initial encounter.  EXAM: CT HEAD WITHOUT CONTRAST  CT CERVICAL SPINE WITHOUT CONTRAST  TECHNIQUE: Multidetector CT imaging of the head and cervical spine was performed following the standard protocol without intravenous contrast. Multiplanar CT image reconstructions of the cervical spine were also generated.  COMPARISON:  None.  FINDINGS: CT HEAD FINDINGS  There is evidence of small vessel disease in the periventricular white matter. The brain demonstrates no evidence of hemorrhage, infarction, edema, mass effect, extra-axial fluid collection, hydrocephalus or mass lesion. The skull is unremarkable and shows no evidence of fracture.  CT CERVICAL SPINE FINDINGS  The cervical spine   shows normal alignment. There is no evidence of acute fracture or subluxation. No soft tissue swelling or hematoma is identified. No bony or soft tissue lesions are seen. Diffuse spondylosis of the cervical spine present at nearly every level. There is an associated mild anterolisthesis of C4 on C5 which is likely degenerative and measures approximately 3 mm. No evidence of acute fracture, traumatic subluxation or soft tissue swelling. The visualized airway is normally patent. Visualized lung apices demonstrate evidence of emphysema. There is incidental calcified plaque noted at both carotid bifurcations.  IMPRESSION: No acute head injury or cervical spine injury. Evidence of small vessel disease in the periventricular white matter. Diffuse spondylosis of the cervical spine present. Incidental carotid atherosclerosis.   Electronically Signed   By: Glenn  Yamagata M.D.   On: 04/05/2015 12:18   Ct Cervical Spine Wo Contrast  04/05/2015   CLINICAL DATA:  Tripped and fall with injury to posterior skull. Headache and neck pain after injury. Initial encounter.  EXAM: CT HEAD WITHOUT CONTRAST  CT CERVICAL SPINE WITHOUT CONTRAST  TECHNIQUE:  Multidetector CT imaging of the head and cervical spine was performed following the standard protocol without intravenous contrast. Multiplanar CT image reconstructions of the cervical spine were also generated.  COMPARISON:  None.  FINDINGS: CT HEAD FINDINGS  There is evidence of small vessel disease in the periventricular white matter. The brain demonstrates no evidence of hemorrhage, infarction, edema, mass effect, extra-axial fluid collection, hydrocephalus or mass lesion. The skull is unremarkable and shows no evidence of fracture.  CT CERVICAL SPINE FINDINGS  The cervical spine shows normal alignment. There is no evidence of acute fracture or subluxation. No soft tissue swelling or hematoma is identified. No bony or soft tissue lesions are seen. Diffuse spondylosis of the cervical spine present at nearly every level. There is an associated mild anterolisthesis of C4 on C5 which is likely degenerative and measures approximately 3 mm. No evidence of acute fracture, traumatic subluxation or soft tissue swelling. The visualized airway is normally patent. Visualized lung apices demonstrate evidence of emphysema. There is incidental calcified plaque noted at both carotid bifurcations.  IMPRESSION: No acute head injury or cervical spine injury. Evidence of small vessel disease in the periventricular white matter. Diffuse spondylosis of the cervical spine present. Incidental carotid atherosclerosis.   Electronically Signed   By: Glenn  Yamagata M.D.   On: 04/05/2015 12:18   Dg Shoulder Left  04/05/2015   CLINICAL DATA:  Fell this morning over shoe box in her living room, put her LEFT arm out to brace the fall, LEFT shoulder pain  EXAM: LEFT SHOULDER - 2+ VIEW  COMPARISON:  None  FINDINGS: Mild osseous demineralization.  AC joint alignment normal.  Glenohumeral joint alignment normal.  No acute fracture, dislocation, or bone destruction.  Visualized LEFT ribs intact.  IMPRESSION: Osseous demineralization without  acute abnormalities.   Electronically Signed   By: Mark  Boles M.D.   On: 04/05/2015 13:10   Dg Hip Unilat With Pelvis 2-3 Views Left  04/05/2015   CLINICAL DATA:  Tripped over a shoe box and fell backwards, LEFT hip pain  EXAM: LEFT HIP (WITH PELVIS) 2-3 VIEWS  COMPARISON:  None  FINDINGS: Diffuse osseous demineralization.  Minimal narrowing of the hips joints bilaterally.  SI joints symmetric.  Minimally displaced subcapital fracture LEFT femoral neck.  No dislocation.  Pelvis appears intact.  Scattered atherosclerotic calcifications and few pelvic phleboliths.  Slight prominent stool in rectum.  IMPRESSION: Osseous demineralization with a minimally displaced   subcapital fracture LEFT femoral neck.   Electronically Signed   By: Mark  Boles M.D.   On: 04/05/2015 10:55    Positive ROS: All other systems have been reviewed and were otherwise negative with the exception of those mentioned in the HPI and as above.  Labs cbc  Recent Labs  04/05/15 1133  WBC 7.8  HGB 12.5  HCT 40.6  PLT 272    Labs inflam No results for input(s): CRP in the last 72 hours.  Invalid input(s): ESR  Labs coag  Recent Labs  04/05/15 1133  INR 0.91     Recent Labs  04/05/15 1133  NA 137  K 4.0  CL 100  CO2 31  GLUCOSE 151*  BUN 22  CREATININE 0.96  CALCIUM 8.7    Physical Exam: Filed Vitals:   04/05/15 1550  BP: 122/61  Pulse: 99  Temp: 99.8 F (37.7 C)  Resp: 21   General: Alert, no acute distress Cardiovascular: No pedal edema Respiratory: No cyanosis, no use of accessory musculature GI: No organomegaly, abdomen is soft and non-tender Skin: No lesions in the area of chief complaint other than those listed below in MSK exam.  Neurologic: Decreased sensation in the BLE due to diabetic neuropathy Psychiatric: Patient is competent for consent with normal mood and affect Lymphatic: No axillary or cervical lymphadenopathy  MUSCULOSKELETAL:  Mild swelling but no erythema or the left  hip.  Diffusely tender to palpation.  Sensation is intact distally with decreased sensation in the B feet due to diabetic neuropathy.  +2 pulses. Other extremities are atraumatic with painless ROM and NVI.  Assessment: L femoral neck fracture  Plan: Plan is to take to the OR tomorrow morning for hip pinning.  Will make NPO after midnight tonight. Weight Bearing Status: bedrest/NWB till surgery tomorrow PT VTE px: SCD's and chemical prophylaxis is being held due to surgery tomorrow.  Plavix will be restarted post-op.   Delitha Elms Marie, PA-C Cell [phone]   04/05/2015 4:04 PM 

## 2015-04-06 NOTE — Anesthesia Postprocedure Evaluation (Signed)
Anesthesia Post Note  Patient: Jennifer Duran  Procedure(s) Performed: Procedure(s) (LRB): CANNULATED HIP PINNING (Left)  Anesthesia type: General  Patient location: PACU  Post pain: Pain level controlled  Post assessment: Post-op Vital signs reviewed  Last Vitals: BP 141/38 mmHg  Pulse 84  Temp(Src) 36.6 C (Oral)  Resp 19  Wt 124 lb (56.246 kg)  SpO2 100%  Post vital signs: Reviewed  Level of consciousness: sedated  Complications: No apparent anesthesia complications

## 2015-04-07 DIAGNOSIS — S72002A Fracture of unspecified part of neck of left femur, initial encounter for closed fracture: Secondary | ICD-10-CM

## 2015-04-07 LAB — GLUCOSE, CAPILLARY
GLUCOSE-CAPILLARY: 132 mg/dL — AB (ref 70–99)
GLUCOSE-CAPILLARY: 173 mg/dL — AB (ref 70–99)
GLUCOSE-CAPILLARY: 153 mg/dL — AB (ref 70–99)
Glucose-Capillary: 137 mg/dL — ABNORMAL HIGH (ref 70–99)

## 2015-04-07 LAB — BASIC METABOLIC PANEL
Anion gap: 11 (ref 5–15)
BUN: 23 mg/dL (ref 6–23)
CHLORIDE: 101 mmol/L (ref 96–112)
CO2: 25 mmol/L (ref 19–32)
CREATININE: 1.12 mg/dL — AB (ref 0.50–1.10)
Calcium: 8.1 mg/dL — ABNORMAL LOW (ref 8.4–10.5)
GFR calc Af Amer: 51 mL/min — ABNORMAL LOW (ref >90)
GFR, EST NON AFRICAN AMERICAN: 44 mL/min — AB (ref >90)
Glucose, Bld: 139 mg/dL — ABNORMAL HIGH (ref 70–99)
Potassium: 4.2 mmol/L (ref 3.5–5.1)
Sodium: 137 mmol/L (ref 135–145)

## 2015-04-07 LAB — CBC
HCT: 34.6 % — ABNORMAL LOW (ref 36.0–46.0)
Hemoglobin: 11.1 g/dL — ABNORMAL LOW (ref 12.0–15.0)
MCH: 29 pg (ref 26.0–34.0)
MCHC: 32.1 g/dL (ref 30.0–36.0)
MCV: 90.3 fL (ref 78.0–100.0)
Platelets: 187 10*3/uL (ref 150–400)
RBC: 3.83 MIL/uL — ABNORMAL LOW (ref 3.87–5.11)
RDW: 14 % (ref 11.5–15.5)
WBC: 8.3 10*3/uL (ref 4.0–10.5)

## 2015-04-07 MED ORDER — MAGNESIUM HYDROXIDE 400 MG/5ML PO SUSP
30.0000 mL | Freq: Every day | ORAL | Status: DC | PRN
  Administered 2015-04-07: 30 mL via ORAL
  Filled 2015-04-07: qty 30

## 2015-04-07 MED ORDER — POLYETHYLENE GLYCOL 3350 17 G PO PACK
17.0000 g | PACK | Freq: Every day | ORAL | Status: DC
  Administered 2015-04-08: 17 g via ORAL
  Filled 2015-04-07 (×2): qty 1

## 2015-04-07 MED ORDER — BISACODYL 10 MG RE SUPP
10.0000 mg | Freq: Every day | RECTAL | Status: DC | PRN
  Administered 2015-04-08: 10 mg via RECTAL
  Filled 2015-04-07: qty 1

## 2015-04-07 NOTE — Progress Notes (Signed)
Subjective: 1 Day Post-Op Procedure(s) (LRB): CANNULATED HIP PINNING (Left) Patient reports pain as mild.    Objective: Vital signs in last 24 hours: Temp:  [99.1 F (37.3 C)-100.2 F (37.9 C)] 99.3 F (37.4 C) (04/09 0506) Pulse Rate:  [90-105] 91 (04/09 0506) Resp:  [16-18] 16 (04/09 0506) BP: (113-143)/(42-49) 113/47 mmHg (04/09 0506) SpO2:  [91 %-97 %] 95 % (04/09 1128) FiO2 (%):  [32 %] 32 % (04/09 1128)  Intake/Output from previous day: 04/08 0701 - 04/09 0700 In: 1080 [P.O.:480; I.V.:600] Out: 3 [Urine:3] Intake/Output this shift: Total I/O In: 240 [P.O.:240] Out: -    Recent Labs  04/05/15 1133 04/06/15 0452 04/07/15 0600  HGB 12.5 12.8 11.1*    Recent Labs  04/06/15 0452 04/07/15 0600  WBC 9.4 8.3  RBC 4.40 3.83*  HCT 39.1 34.6*  PLT 329 187    Recent Labs  04/06/15 0452 04/07/15 0600  NA 139 137  K 4.1 4.2  CL 102 101  CO2 29 25  BUN 20 23  CREATININE 1.11* 1.12*  GLUCOSE 73 139*  CALCIUM 8.7 8.1*    Recent Labs  04/05/15 1133  INR 0.91    Intact pulses distally Dorsiflexion/Plantar flexion intact Incision: dressing C/D/I No cellulitis present  Assessment/Plan: 1 Day Post-Op Procedure(s) (LRB): CANNULATED HIP PINNING (Left) Up with therapy  WBAT LLE, walker, dvt proph plavix and asa, pain med as ordered dispo per med team will likely need SNF  Jennifer Duran 04/07/2015, 1:31 PM

## 2015-04-07 NOTE — Progress Notes (Signed)
OT Cancellation Note  Patient Details Name: Jennifer Duran MRN: [id number] DOB: 04/15/1929   Cancelled Treatment:    Reason Eval/Treat Not Completed: OT screened. Pt's current D/C plan is SNF. No apparent immediate acute care OT needs, therefore will defer OT to SNF. If OT eval is needed please call Acute Rehab Dept. at [phone] or text page OT at [phone].    Benito Mccreedy OTR/L [phone] 04/07/2015, 5:22 PM

## 2015-04-07 NOTE — Evaluation (Signed)
Physical Therapy Evaluation Patient Details Name: Jennifer Duran MRN: [id number] DOB: 1928/12/30 Today's Date: 04/07/2015   History of Present Illness  Thyra D Kanaan is a 81 y.o. female who complains of Left hip pain after a mechanical fall today. She was found to have a femoral neck fracture of the left hip and underwent cannulated pinning on 04/06/15. She lives at an independent living facility and is ambulatory with a walker at baseline.PMH: DM, COPD, OP, PVD, lung mass, mastectomy, Hashimotos.  Clinical Impression  Pt admitted with above diagnosis. Pt currently with functional limitations due to the deficits listed below (see PT Problem List). Pt transferred to chair with +2 mod A, tolerated mobility well, is generally weak and anticipate her needing higher level care short term before returning to ALF. Pt will benefit from skilled PT to increase their independence and safety with mobility to allow discharge to the venue listed below.       Follow Up Recommendations SNF;Supervision/Assistance - 24 hour    Equipment Recommendations  None recommended by PT    Recommendations for Other Services OT consult     Precautions / Restrictions Precautions Precautions: Fall Precaution Comments: pt fell while taking 3 steps without RW Restrictions Weight Bearing Restrictions: Yes LLE Weight Bearing: Weight bearing as tolerated      Mobility  Bed Mobility Overal bed mobility: Needs Assistance Bed Mobility: Rolling;Supine to Sit Rolling: Mod assist   Supine to sit: Mod assist     General bed mobility comments: left LE supported, use of rail, vc's for sequencing, mod A to achieve full SL and to elevate trunk and scoot to EOB  Transfers Overall transfer level: Needs assistance Equipment used: Rolling walker (2 wheeled) Transfers: Sit to/from Stand Sit to Stand: Mod assist         General transfer comment: mod A for power up, pt resistant to putting wt through LLE but  improved with time standing. Mod A +2 for transfer to chair, vc's for each step and mod A from behind as well as assist at front for moving RW  Ambulation/Gait   Ambulation Distance (Feet): 2 Feet Assistive device: Rolling walker (2 wheeled) Gait Pattern/deviations: Step-to pattern Gait velocity: very slow Gait velocity interpretation: <1.8 ft/sec, indicative of risk for recurrent falls General Gait Details: pivot steps to chair, see transfer note  Stairs            Wheelchair Mobility    Modified Rankin (Stroke Patients Only)       Balance Overall balance assessment: Needs assistance;History of Falls Sitting-balance support: No upper extremity supported;Feet supported Sitting balance-Leahy Scale: Fair     Standing balance support: Bilateral upper extremity supported;During functional activity Standing balance-Leahy Scale: Poor Standing balance comment: cannot stand without UE support                             Pertinent Vitals/Pain Pain Assessment: Faces Faces Pain Scale: Hurts even more Pain Location: left hip Pain Descriptors / Indicators: Aching Pain Intervention(s): Limited activity within patient's tolerance  O2 sats 93% on 2L O2, HR 101 bpm after session    Home Living Family/patient expects to be discharged to:: Assisted living               Home Equipment: Walker - 4 wheels Additional Comments: wears O2 24/7, 2L in day, 4L at night. Has lived at [place] since her husband died a year ago    Prior  Function Level of Independence: Independent with assistive device(s)               Hand Dominance        Extremity/Trunk Assessment   Upper Extremity Assessment: Defer to OT evaluation           Lower Extremity Assessment: LLE deficits/detail;RLE deficits/detail;Generalized weakness RLE Deficits / Details: generalized weakness LLE Deficits / Details: cannot lift LLE against gravity, strength at knee and ankle grossly  3+/5  Cervical / Trunk Assessment: Kyphotic  Communication   Communication: No difficulties  Cognition Arousal/Alertness: Awake/alert Behavior During Therapy: WFL for tasks assessed/performed Overall Cognitive Status: History of cognitive impairments - at baseline       Memory: Decreased short-term memory              General Comments General comments (skin integrity, edema, etc.): expect that pt will need more care and therapy than she would receive at ALF level, she is agreeable to SNF for rehab for short time    Exercises General Exercises - Lower Extremity Ankle Circles/Pumps: AROM;Both;10 reps;Seated Quad Sets: AROM;Both;10 reps;Seated Gluteal Sets: AROM;Both;10 reps;Seated      Assessment/Plan    PT Assessment Patient needs continued PT services  PT Diagnosis Difficulty walking;Acute pain;Generalized weakness;Abnormality of gait   PT Problem List Decreased strength;Decreased range of motion;Decreased activity tolerance;Decreased balance;Decreased mobility;Decreased cognition;Decreased knowledge of use of DME;Decreased knowledge of precautions;Pain  PT Treatment Interventions DME instruction;Gait training;Functional mobility training;Therapeutic activities;Therapeutic exercise;Balance training;Patient/family education   PT Goals (Current goals can be found in the Care Plan section) Acute Rehab PT Goals Patient Stated Goal: get stronger PT Goal Formulation: With patient Time For Goal Achievement: 04/14/15 Potential to Achieve Goals: Good    Frequency Min 3X/week   Barriers to discharge Decreased caregiver support ALF    Co-evaluation               End of Session Equipment Utilized During Treatment: Gait belt;Oxygen Activity Tolerance: Patient tolerated treatment well Patient left: in chair;with chair alarm set;with call bell/phone within reach Nurse Communication: Mobility status         Time: 7915-0569 PT Time Calculation (min) (ACUTE ONLY): 21  min   Charges:   PT Evaluation $Initial PT Evaluation Tier I: 1 Procedure     PT G Codes:       Leighton Roach, PT  Acute Rehab Services  [phone]  Leighton Roach 04/07/2015, 10:55 AM

## 2015-04-07 NOTE — Progress Notes (Signed)
Patient Demographics  Jennifer Duran, is a 81 y.o. female, DOB - 1929-03-15, HMC:[id number]  Admit date - 04/05/2015   Admitting Physician Renette Butters, MD  Outpatient Primary MD for the patient is No primary care provider on file.  LOS - 2   Chief Complaint  Patient presents with  . Fall        Subjective:   Jennifer Duran today has, No headache, No chest pain, No abdominal pain - No Nausea, No new weakness tingling or numbness, No Cough - SOB. Mild L hip pain better than yesterday.  Assessment & Plan    1. Mechanical Fall with  Hip fracture, left - post ORIF 04-06-15, ASA-Plavix for DVT prophylaxis per Ortho, WBAT, PT and supportive Rx. May need placement. H&H stable.   2. COPD - on 2lit [place] o2 at home , Nebs scheduled and as needed, continue.   3.HTN - PRN hydrlazine and monitor, she is off of her ACE x 1 week.   4.DM2 - on lantus + ISS  CBG (last 3)   Recent Labs  04/06/15 1629 04/06/15 2116 04/07/15 0627  GLUCAP 209* 127* 132*    5.Anxiety and depression - home Meds.   6. Cor pulmonale - continue Lasix home dose, o2.    7. H/o PET positive nodule - pulm follow up suggested. Also following with Dr Luretha Rued.     Code Status: DNR  Family Communication: none  Disposition Plan: TBD   Procedures   ORIF L Hip 04-06-15   Consults  Ortho   Medications  Scheduled Meds: . aspirin EC  325 mg Oral Q breakfast  . Chlorhexidine Gluconate Cloth  6 each Topical Q0600  . citalopram  20 mg Oral Daily  . clonazePAM  0.25 mg Oral Q breakfast  . clonazePAM  0.5 mg Oral QHS  . clopidogrel  75 mg Oral Q breakfast  . ferrous sulfate  325 mg Oral Daily  . furosemide  40 mg Oral Daily  . insulin aspart  0-5 Units Subcutaneous QHS  . insulin aspart  0-9 Units Subcutaneous  TID WC  . insulin glargine  5 Units Subcutaneous QHS  . ipratropium-albuterol  3 mL Nebulization TID  . loratadine  10 mg Oral Daily  . mometasone-formoterol  2 puff Inhalation BID  . mupirocin ointment  1 application Nasal BID  . polyethylene glycol  17 g Oral Daily  . sodium chloride  3 mL Intravenous Q12H  . tiotropium  18 mcg Inhalation Daily  . traMADol  100 mg Oral QHS   Continuous Infusions:  PRN Meds:.sodium chloride, acetaminophen **OR** [DISCONTINUED] acetaminophen, albuterol, alum & mag hydroxide-simeth, bisacodyl, hydrALAZINE, HYDROcodone-acetaminophen, ipratropium, morphine injection, [DISCONTINUED] ondansetron **OR** ondansetron (ZOFRAN) IV, oxyCODONE, sodium chloride  DVT Prophylaxis Plavix and ASA  Per Ortho   Lab Results  Component Value Date   PLT 187 04/07/2015    Antibiotics     Anti-infectives    Start     Dose/Rate Route Frequency Ordered Stop   04/06/15 1300  ceFAZolin (ANCEF) IVPB 2 g/50 mL premix     2 g 100 mL/hr over 30 Minutes Intravenous Every 6 hours 04/06/15 1019 04/06/15 2109   04/06/15 1030  ceFAZolin (ANCEF) IVPB 2 g/50 mL premix  2 g 100 mL/hr over 30 Minutes Intravenous On call to O.R. 04/06/15 1020 04/06/15 1107          Objective:   Filed Vitals:   04/06/15 2009 04/06/15 2110 04/07/15 0506 04/07/15 0917  BP: 125/49  113/47   Pulse: 90  91   Temp: 99.1 F (37.3 C)  99.3 F (37.4 C)   TempSrc: Oral     Resp: 18  16   Weight:      SpO2: 94% 95% 95% 97%    Wt Readings from Last 3 Encounters:  04/05/15 56.246 kg (124 lb)  03/22/15 56.79 kg (125 lb 3.2 oz)  09/21/14 53.706 kg (118 lb 6.4 oz)     Intake/Output Summary (Last 24 hours) at 04/07/15 1127 Last data filed at 04/07/15 0758  Gross per 24 hour  Intake    720 ml  Output      3 ml  Net    717 ml     Physical Exam  Awake Alert, Oriented X 3, No new F.N deficits, Normal affect [place].AT,PERRAL Supple Neck,No JVD, No cervical lymphadenopathy appriciated.   Symmetrical Chest wall movement, Good air movement bilaterally, CTAB RRR,No Gallops,Rubs or new Murmurs, No Parasternal Heave +ve B.Sounds, Abd Soft, No tenderness, No organomegaly appriciated, No rebound - guarding or rigidity. No Cyanosis, Clubbing or edema, No new Rash or bruise , l hip incision site stable.    Data Review   Micro Results Recent Results (from the past 240 hour(s))  Surgical pcr screen     Status: Abnormal   Collection Time: 04/05/15  4:00 PM  Result Value Ref Range Status   MRSA, PCR POSITIVE (A) NEGATIVE Final   Staphylococcus aureus POSITIVE (A) NEGATIVE Final    Comment:        The Xpert SA Assay (FDA approved for NASAL specimens in patients over 20 years of age), is one component of a comprehensive surveillance program.  Test performance has been validated by [facility] for patients greater than or equal to 75 year old. It is not intended to diagnose infection nor to guide or monitor treatment.     Radiology Reports Dg Chest 1 View  04/05/2015   CLINICAL DATA:  Preoperative evaluation, hip fracture, history COPD, Hashimoto's disease, cor pulmonale, lung mass post radiation, diabetes  EXAM: CHEST  1 VIEW  COMPARISON:  03/22/2015  FINDINGS: Normal heart size, mediastinal contours and pulmonary vascularity.  Atherosclerotic calcifications aorta.  Emphysematous and bronchitic changes consistent with COPD.  Scattered interstitial prominence in mid to lower lungs.  Vague LEFT upper lobe density again identified, increased versus previous exam, though slightly more similar to the earlier study of 06/08/2014  No acute infiltrate, pleural effusion or pneumothorax.  Bones demineralized.  IMPRESSION: COPD changes with scattered interstitial disease and a vague persistent area of increased attenuation in the LEFT upper lobe question slightly increased in counts acuity; recommend CT chest to exclude recurrent nodule.   Electronically Signed   By: Lavonia Dana M.D.   On:  04/05/2015 13:16   Dg Chest 2 View  03/22/2015   CLINICAL DATA:  Shortness of breath.  EXAM: CHEST  2 VIEW  COMPARISON:  CT 08/08/2014.  Chest x-ray 06/08/2014, 02/25/ 2015.  FINDINGS: Mediastinum and hilar structures are normal. Stable apical pleural parenchymal thickening consistent with scarring. Stable interstitial prominence noted consistent with chronic interstitial lung disease. COPD. Heart size normal. No acute bony abnormality.  IMPRESSION: 1. Stable biapical pleural parenchymal scarring. No recurrent mass. Left  apical density is unchanged. 2. Chronic interstitial lung disease and COPD.   Electronically Signed   By: Marcello Moores  Register   On: 03/22/2015 14:05   Ct Head Wo Contrast  04/05/2015   CLINICAL DATA:  Tripped and fall with injury to posterior skull. Headache and neck pain after injury. Initial encounter.  EXAM: CT HEAD WITHOUT CONTRAST  CT CERVICAL SPINE WITHOUT CONTRAST  TECHNIQUE: Multidetector CT imaging of the head and cervical spine was performed following the standard protocol without intravenous contrast. Multiplanar CT image reconstructions of the cervical spine were also generated.  COMPARISON:  None.  FINDINGS: CT HEAD FINDINGS  There is evidence of small vessel disease in the periventricular white matter. The brain demonstrates no evidence of hemorrhage, infarction, edema, mass effect, extra-axial fluid collection, hydrocephalus or mass lesion. The skull is unremarkable and shows no evidence of fracture.  CT CERVICAL SPINE FINDINGS  The cervical spine shows normal alignment. There is no evidence of acute fracture or subluxation. No soft tissue swelling or hematoma is identified. No bony or soft tissue lesions are seen. Diffuse spondylosis of the cervical spine present at nearly every level. There is an associated mild anterolisthesis of C4 on C5 which is likely degenerative and measures approximately 3 mm. No evidence of acute fracture, traumatic subluxation or soft tissue swelling. The  visualized airway is normally patent. Visualized lung apices demonstrate evidence of emphysema. There is incidental calcified plaque noted at both carotid bifurcations.  IMPRESSION: No acute head injury or cervical spine injury. Evidence of small vessel disease in the periventricular white matter. Diffuse spondylosis of the cervical spine present. Incidental carotid atherosclerosis.   Electronically Signed   By: Aletta Edouard M.D.   On: 04/05/2015 12:18   Ct Cervical Spine Wo Contrast  04/05/2015   CLINICAL DATA:  Tripped and fall with injury to posterior skull. Headache and neck pain after injury. Initial encounter.  EXAM: CT HEAD WITHOUT CONTRAST  CT CERVICAL SPINE WITHOUT CONTRAST  TECHNIQUE: Multidetector CT imaging of the head and cervical spine was performed following the standard protocol without intravenous contrast. Multiplanar CT image reconstructions of the cervical spine were also generated.  COMPARISON:  None.  FINDINGS: CT HEAD FINDINGS  There is evidence of small vessel disease in the periventricular white matter. The brain demonstrates no evidence of hemorrhage, infarction, edema, mass effect, extra-axial fluid collection, hydrocephalus or mass lesion. The skull is unremarkable and shows no evidence of fracture.  CT CERVICAL SPINE FINDINGS  The cervical spine shows normal alignment. There is no evidence of acute fracture or subluxation. No soft tissue swelling or hematoma is identified. No bony or soft tissue lesions are seen. Diffuse spondylosis of the cervical spine present at nearly every level. There is an associated mild anterolisthesis of C4 on C5 which is likely degenerative and measures approximately 3 mm. No evidence of acute fracture, traumatic subluxation or soft tissue swelling. The visualized airway is normally patent. Visualized lung apices demonstrate evidence of emphysema. There is incidental calcified plaque noted at both carotid bifurcations.  IMPRESSION: No acute head injury or  cervical spine injury. Evidence of small vessel disease in the periventricular white matter. Diffuse spondylosis of the cervical spine present. Incidental carotid atherosclerosis.   Electronically Signed   By: Aletta Edouard M.D.   On: 04/05/2015 12:18   Pelvis Portable  04/06/2015   CLINICAL DATA:  Hip fracture  EXAM: PORTABLE PELVIS 1-2 VIEWS  COMPARISON:  None.  FINDINGS: Three cannulated screws transfixing left femoral neck fracture.  No other acute fracture or dislocation. Mild osteoarthritis of bilateral hips. No lytic or sclerotic osseous lesion.  IMPRESSION: Three cannulated screws transfixing a left femoral neck fracture.   Electronically Signed   By: Kathreen Devoid   On: 04/06/2015 11:22   Dg Shoulder Left  04/05/2015   CLINICAL DATA:  [place] this morning over shoe box in her living room, put her LEFT arm out to brace the fall, LEFT shoulder pain  EXAM: LEFT SHOULDER - 2+ VIEW  COMPARISON:  None  FINDINGS: Mild osseous demineralization.  AC joint alignment normal.  Glenohumeral joint alignment normal.  No acute fracture, dislocation, or bone destruction.  Visualized LEFT ribs intact.  IMPRESSION: Osseous demineralization without acute abnormalities.   Electronically Signed   By: Lavonia Dana M.D.   On: 04/05/2015 13:10   Dg Hip Operative Unilat With Pelvis Left  04/06/2015   CLINICAL DATA:  Left hip surgery.  EXAM: OPERATIVE left HIP (WITH PELVIS IF PERFORMED) 2 VIEWS  TECHNIQUE: Fluoroscopic spot image(s) were submitted for interpretation post-operatively.  FLUOROSCOPY TIME:  Fluoroscopy Time:  0 min, 24 seconds  Number of Acquired Images:  2  COMPARISON:  None.  FINDINGS: Patient status post open reduction internal fixation of left femoral neck fracture. Good anatomic alignment. Hardware intact.  IMPRESSION: Open reduction internal fixation of left femoral neck fracture with good anatomic alignment.   Electronically Signed   By: Marcello Moores  Register   On: 04/06/2015 08:34   Dg Hip Unilat With Pelvis 2-3  Views Left  04/05/2015   CLINICAL DATA:  Tripped over a shoe box and fell backwards, LEFT hip pain  EXAM: LEFT HIP (WITH PELVIS) 2-3 VIEWS  COMPARISON:  None  FINDINGS: Diffuse osseous demineralization.  Minimal narrowing of the hips joints bilaterally.  SI joints symmetric.  Minimally displaced subcapital fracture LEFT femoral neck.  No dislocation.  Pelvis appears intact.  Scattered atherosclerotic calcifications and few pelvic phleboliths.  Slight prominent stool in rectum.  IMPRESSION: Osseous demineralization with a minimally displaced subcapital fracture LEFT femoral neck.   Electronically Signed   By: Lavonia Dana M.D.   On: 04/05/2015 10:55     CBC  Recent Labs Lab 04/05/15 1133 04/06/15 0452 04/07/15 0600  WBC 7.8 9.4 8.3  HGB 12.5 12.8 11.1*  HCT 40.6 39.1 34.6*  PLT 272 329 187  MCV 92.1 88.9 90.3  MCH 28.3 29.1 29.0  MCHC 30.8 32.7 32.1  RDW 13.8 14.0 14.0  LYMPHSABS 1.1  --   --   MONOABS 0.4  --   --   EOSABS 0.3  --   --   BASOSABS 0.0  --   --     Chemistries   Recent Labs Lab 04/05/15 1133 04/06/15 0452 04/07/15 0600  NA 137 139 137  K 4.0 4.1 4.2  CL 100 102 101  CO2 31 29 25   GLUCOSE 151* 73 139*  BUN 22 20 23   CREATININE 0.96 1.11* 1.12*  CALCIUM 8.7 8.7 8.1*   ------------------------------------------------------------------------------------------------------------------ estimated creatinine clearance is 31.5 mL/min (by C-G formula based on Cr of 1.12). ------------------------------------------------------------------------------------------------------------------ No results for input(s): HGBA1C in the last 72 hours. ------------------------------------------------------------------------------------------------------------------ No results for input(s): CHOL, HDL, LDLCALC, TRIG, CHOLHDL, LDLDIRECT in the last 72 hours. ------------------------------------------------------------------------------------------------------------------ No results  for input(s): TSH, T4TOTAL, T3FREE, THYROIDAB in the last 72 hours.  Invalid input(s): FREET3 ------------------------------------------------------------------------------------------------------------------ No results for input(s): VITAMINB12, FOLATE, FERRITIN, TIBC, IRON, RETICCTPCT in the last 72 hours.  Coagulation profile  Recent Labs Lab 04/05/15 1133  INR 0.91    No results for input(s): DDIMER in the last 72 hours.  Cardiac Enzymes  Recent Labs Lab 04/05/15 1133  TROPONINI <0.03   ------------------------------------------------------------------------------------------------------------------ Invalid input(s): POCBNP     Time Spent in minutes  35   Rune Mendez K M.D on 04/07/2015 at 11:27 AM  Between 7am to 7pm - Pager - [phone]  After 7pm go to [email or web address] - password [facility]  [facility]   Office  [phone]

## 2015-04-08 DIAGNOSIS — J439 Emphysema, unspecified: Secondary | ICD-10-CM

## 2015-04-08 LAB — GLUCOSE, CAPILLARY
GLUCOSE-CAPILLARY: 129 mg/dL — AB (ref 70–99)
Glucose-Capillary: 119 mg/dL — ABNORMAL HIGH (ref 70–99)
Glucose-Capillary: 147 mg/dL — ABNORMAL HIGH (ref 70–99)
Glucose-Capillary: 100 mg/dL — ABNORMAL HIGH (ref 70–99)

## 2015-04-08 MED ORDER — IPRATROPIUM-ALBUTEROL 0.5-2.5 (3) MG/3ML IN SOLN: 3.0000 mL | RESPIRATORY_TRACT | Status: DC | PRN

## 2015-04-08 NOTE — Progress Notes (Signed)
Clinical Social Work Department BRIEF PSYCHOSOCIAL ASSESSMENT 04/08/2015  Patient:  Jennifer Duran, Jennifer Duran     Account Number:  [id number]     Admit date:  04/05/2015  Clinical Social Worker:  Pete Pelt, CLINICAL SOCIAL WORKER  Date/Time:  04/08/2015 03:53 PM  Referred by:  Physician  Date Referred:  04/08/2015 Referred for  SNF Placement   Other Referral:   Interview type:  Patient Other interview type:   CSW also spoke with the Pt's son anddaughter in [place].  Jennifer Duran [phone]    PSYCHOSOCIAL DATA Living Status:  FACILITY Admitted from facility:  [facility] Level of care:   Primary support name:  Jennifer Duran [phone] Primary support relationship to patient:  CHILD, ADULT Degree of support available:   Pt has a good support from her facility. Pt has no local family members. Pt's son lives in [place] Current Concerns  Post-Acute Placement   Other Concerns:    SOCIAL WORK ASSESSMENT / PLAN CSW met with the Pt and family at the bedside. Pt is A&O x4. Pt was in good spirits. Pt is aware that she needs to go and receive rehab prior to returning to [place]. Pt stated that she is agreeable to going to a location near her son's location. Pt stated that Pt's son is the [facility] and takes care of all her decisions. Pt son stated that he and his wife are looking for a ALF facility near where they live for some time now but that they would also agree to her moving to a SNF in the [facility] area. Pt's son gave permisssion for CSW to search in the [facility] for SNF placement. CSW begin search in that area.   Assessment/plan status:  Information/Referral to [employer] Other assessment/ plan:   Information/referral to community resources:   CSW provided a SNF listing for the [facility] area.    PATIENT'S/FAMILY'S RESPONSE TO PLAN OF CARE: Pt and family are appreciative for assistance with SNF placement. CSW will continue to  follow for SNF placement and d/c planning.        [facility]  2S, [address], [address], 6N, [place], PEDS/PICU [phone]

## 2015-04-08 NOTE — Progress Notes (Addendum)
Clinical Social Work Department CLINICAL SOCIAL WORK PLACEMENT NOTE 04/08/2015  Patient:  Jennifer Duran, Jennifer Duran  Account Number:  [id number] Admit date:  04/05/2015  Clinical Social Worker:  Pete Pelt, CLINICAL SOCIAL WORKER  Date/time:  04/08/2015 04:07 PM  Clinical Social Work is seeking post-discharge placement for this patient at the following level of care:   SKILLED NURSING   (*CSW will update this form in Epic as items are completed)   04/08/2015  Patient/family provided with [place] Department of Clinical Social Work's list of facilities offering this level of care within the geographic area requested by the patient (or if unable, by the patient's family).  04/08/2015  Patient/family informed of their freedom to choose among providers that offer the needed level of care, that participate in Medicare, Medicaid or managed care program needed by the patient, have an available bed and are willing to accept the patient.  04/08/2015  Patient/family informed of [facility]' ownership interest in [facility], as well as of the fact that they are under no obligation to receive care at this facility.  PASARR submitted to EDS on 04/08/2015 PASARR number received on 04/08/2015  FL2 transmitted to all facilities in geographic area requested by pt/family on  04/08/2015 FL2 transmitted to all facilities within larger geographic area on 04/08/2015  Patient informed that his/her managed care company has contracts with or will negotiate with  certain facilities, including the following:     Patient/family informed of bed offers received:  04/09/2015 Lubertha Sayres, Latanya Presser) [facility] Physician recommends and patient chooses bed at    Patient to be transferred to  [facility] on  04/09/2015 Lubertha Sayres, [facility]) Patient to be transferred to facility by [facility] Lubertha Sayres, [place]) Patient and family notified of transfer on 04/09/2015  Lubertha Sayres, Latanya Presser) Name of family member notified:  Shalondra Wunschel [facility])  The following physician request were entered in Epic:   Additional Comments:  [facility]  [address]S, [address], [place], [address], [place], PEDS/PICU [phone]  [facility] [place] Cell: [phone]       Fax: [phone] Clinical Social Work: Orthopedics [phone]) and Surgical [phone])

## 2015-04-08 NOTE — Progress Notes (Signed)
Patient Demographics  Jennifer Duran, is a 81 y.o. female, DOB - 15-Nov-1929, OVZ:[id number]  Admit date - 04/05/2015   Admitting Physician Renette Butters, MD  Outpatient Primary MD for the patient is No primary care provider on file.  LOS - 3   Chief Complaint  Patient presents with  . Fall        Subjective:    Jennifer Duran today has, No headache, No chest pain, No abdominal pain - No Nausea, No new weakness tingling or numbness, No Cough - SOB. Mild L hip pain better than yesterday.  Assessment & Plan    1. Mechanical Fall with  Hip fracture, left - post ORIF 04-06-15, ASA-Plavix for DVT prophylaxis per Ortho, WBAT, PT and supportive Rx. Will need placement. H&H stable after mild peri op blood loss.    2. COPD - on 2lit [place] o2 at home, Nebs scheduled and as needed, continue.    3.HTN - PRN hydrlazine and monitor, she is off of her ACE x 1 week.    4.DM2 - on lantus + ISS  CBG (last 3)   Recent Labs  04/07/15 1627 04/07/15 2124 04/08/15 0624  GLUCAP 153* 137* 119*     5.Anxiety and depression - home Meds.    6. Cor pulmonale - continue Lasix home dose, o2.    7. H/o PET positive nodule - pulm follow up suggested. Also following with Dr Luretha Rued.      Code Status: DNR  Family Communication: none  Disposition Plan: SNF   Procedures   ORIF L Hip 04-06-15   Consults  Ortho   Medications  Scheduled Meds: . aspirin EC  325 mg Oral Q breakfast  . Chlorhexidine Gluconate Cloth  6 each Topical Q0600  . citalopram  20 mg Oral Daily  . clonazePAM  0.25 mg Oral Q breakfast  . clonazePAM  0.5 mg Oral QHS  . clopidogrel  75 mg Oral Q breakfast  . ferrous sulfate  325 mg Oral Daily  . furosemide  40 mg Oral Daily  . insulin aspart  0-5 Units Subcutaneous QHS   . insulin aspart  0-9 Units Subcutaneous TID WC  . insulin glargine  5 Units Subcutaneous QHS  . loratadine  10 mg Oral Daily  . mometasone-formoterol  2 puff Inhalation BID  . mupirocin ointment  1 application Nasal BID  . polyethylene glycol  17 g Oral Daily  . sodium chloride  3 mL Intravenous Q12H  . tiotropium  18 mcg Inhalation Daily  . traMADol  100 mg Oral QHS   Continuous Infusions:  PRN Meds:.sodium chloride, acetaminophen **OR** [DISCONTINUED] acetaminophen, alum & mag hydroxide-simeth, bisacodyl, hydrALAZINE, HYDROcodone-acetaminophen, ipratropium-albuterol, magnesium hydroxide, morphine injection, [DISCONTINUED] ondansetron **OR** ondansetron (ZOFRAN) IV, oxyCODONE, sodium chloride  DVT Prophylaxis Plavix and ASA  Per Ortho   Lab Results  Component Value Date   PLT 187 04/07/2015    Antibiotics     Anti-infectives    Start     Dose/Rate Route Frequency Ordered Stop   04/06/15 1300  ceFAZolin (ANCEF) IVPB 2 g/50 mL premix     2 g 100 mL/hr over 30 Minutes Intravenous Every 6 hours 04/06/15 1019 04/06/15 2109   04/06/15 1030  ceFAZolin (ANCEF) IVPB 2  g/50 mL premix     2 g 100 mL/hr over 30 Minutes Intravenous On call to O.R. 04/06/15 1020 04/06/15 1107          Objective:   Filed Vitals:   04/07/15 2015 04/07/15 2054 04/08/15 0612 04/08/15 0809  BP: 125/57  107/54   Pulse: 107 101 90   Temp: 100.1 F (37.8 C)  97.5 F (36.4 C)   TempSrc: Oral  Oral   Resp: 16 18 18    Weight:      SpO2: 92% 95% 98% 93%    Wt Readings from Last 3 Encounters:  04/05/15 56.246 kg (124 lb)  03/22/15 56.79 kg (125 lb 3.2 oz)  09/21/14 53.706 kg (118 lb 6.4 oz)     Intake/Output Summary (Last 24 hours) at 04/08/15 1008 Last data filed at 04/08/15 0800  Gross per 24 hour  Intake    480 ml  Output      1 ml  Net    479 ml     Physical Exam  Awake Alert, Oriented X 3, No new F.N deficits, Normal affect [place].AT,PERRAL Supple Neck,No JVD, No cervical  lymphadenopathy appriciated.  Symmetrical Chest wall movement, Good air movement bilaterally, No wheezing RRR,No Gallops,Rubs or new Murmurs, No Parasternal Heave +ve B.Sounds, Abd Soft, No tenderness, No organomegaly appriciated, No rebound - guarding or rigidity. No Cyanosis, Clubbing or edema, No new Rash or bruise , l hip incision site stable.    Data Review   Micro Results Recent Results (from the past 240 hour(s))  Surgical pcr screen     Status: Abnormal   Collection Time: 04/05/15  4:00 PM  Result Value Ref Range Status   MRSA, PCR POSITIVE (A) NEGATIVE Final   Staphylococcus aureus POSITIVE (A) NEGATIVE Final    Comment:        The Xpert SA Assay (FDA approved for NASAL specimens in patients over 32 years of age), is one component of a comprehensive surveillance program.  Test performance has been validated by [facility] for patients greater than or equal to 52 year old. It is not intended to diagnose infection nor to guide or monitor treatment.     Radiology Reports Dg Chest 1 View  04/05/2015   CLINICAL DATA:  Preoperative evaluation, hip fracture, history COPD, Hashimoto's disease, cor pulmonale, lung mass post radiation, diabetes  EXAM: CHEST  1 VIEW  COMPARISON:  03/22/2015  FINDINGS: Normal heart size, mediastinal contours and pulmonary vascularity.  Atherosclerotic calcifications aorta.  Emphysematous and bronchitic changes consistent with COPD.  Scattered interstitial prominence in mid to lower lungs.  Vague LEFT upper lobe density again identified, increased versus previous exam, though slightly more similar to the earlier study of 06/08/2014  No acute infiltrate, pleural effusion or pneumothorax.  Bones demineralized.  IMPRESSION: COPD changes with scattered interstitial disease and a vague persistent area of increased attenuation in the LEFT upper lobe question slightly increased in counts acuity; recommend CT chest to exclude recurrent nodule.   Electronically  Signed   By: Lavonia Dana M.D.   On: 04/05/2015 13:16   Dg Chest 2 View  03/22/2015   CLINICAL DATA:  Shortness of breath.  EXAM: CHEST  2 VIEW  COMPARISON:  CT 08/08/2014.  Chest x-ray 06/08/2014, 02/25/ 2015.  FINDINGS: Mediastinum and hilar structures are normal. Stable apical pleural parenchymal thickening consistent with scarring. Stable interstitial prominence noted consistent with chronic interstitial lung disease. COPD. Heart size normal. No acute bony abnormality.  IMPRESSION: 1. Stable  biapical pleural parenchymal scarring. No recurrent mass. Left apical density is unchanged. 2. Chronic interstitial lung disease and COPD.   Electronically Signed   By: Marcello Moores  Register   On: 03/22/2015 14:05   Ct Head Wo Contrast  04/05/2015   CLINICAL DATA:  Tripped and fall with injury to posterior skull. Headache and neck pain after injury. Initial encounter.  EXAM: CT HEAD WITHOUT CONTRAST  CT CERVICAL SPINE WITHOUT CONTRAST  TECHNIQUE: Multidetector CT imaging of the head and cervical spine was performed following the standard protocol without intravenous contrast. Multiplanar CT image reconstructions of the cervical spine were also generated.  COMPARISON:  None.  FINDINGS: CT HEAD FINDINGS  There is evidence of small vessel disease in the periventricular white matter. The brain demonstrates no evidence of hemorrhage, infarction, edema, mass effect, extra-axial fluid collection, hydrocephalus or mass lesion. The skull is unremarkable and shows no evidence of fracture.  CT CERVICAL SPINE FINDINGS  The cervical spine shows normal alignment. There is no evidence of acute fracture or subluxation. No soft tissue swelling or hematoma is identified. No bony or soft tissue lesions are seen. Diffuse spondylosis of the cervical spine present at nearly every level. There is an associated mild anterolisthesis of C4 on C5 which is likely degenerative and measures approximately 3 mm. No evidence of acute fracture, traumatic  subluxation or soft tissue swelling. The visualized airway is normally patent. Visualized lung apices demonstrate evidence of emphysema. There is incidental calcified plaque noted at both carotid bifurcations.  IMPRESSION: No acute head injury or cervical spine injury. Evidence of small vessel disease in the periventricular white matter. Diffuse spondylosis of the cervical spine present. Incidental carotid atherosclerosis.   Electronically Signed   By: Aletta Edouard M.D.   On: 04/05/2015 12:18   Ct Cervical Spine Wo Contrast  04/05/2015   CLINICAL DATA:  Tripped and fall with injury to posterior skull. Headache and neck pain after injury. Initial encounter.  EXAM: CT HEAD WITHOUT CONTRAST  CT CERVICAL SPINE WITHOUT CONTRAST  TECHNIQUE: Multidetector CT imaging of the head and cervical spine was performed following the standard protocol without intravenous contrast. Multiplanar CT image reconstructions of the cervical spine were also generated.  COMPARISON:  None.  FINDINGS: CT HEAD FINDINGS  There is evidence of small vessel disease in the periventricular white matter. The brain demonstrates no evidence of hemorrhage, infarction, edema, mass effect, extra-axial fluid collection, hydrocephalus or mass lesion. The skull is unremarkable and shows no evidence of fracture.  CT CERVICAL SPINE FINDINGS  The cervical spine shows normal alignment. There is no evidence of acute fracture or subluxation. No soft tissue swelling or hematoma is identified. No bony or soft tissue lesions are seen. Diffuse spondylosis of the cervical spine present at nearly every level. There is an associated mild anterolisthesis of C4 on C5 which is likely degenerative and measures approximately 3 mm. No evidence of acute fracture, traumatic subluxation or soft tissue swelling. The visualized airway is normally patent. Visualized lung apices demonstrate evidence of emphysema. There is incidental calcified plaque noted at both carotid  bifurcations.  IMPRESSION: No acute head injury or cervical spine injury. Evidence of small vessel disease in the periventricular white matter. Diffuse spondylosis of the cervical spine present. Incidental carotid atherosclerosis.   Electronically Signed   By: Aletta Edouard M.D.   On: 04/05/2015 12:18   Pelvis Portable  04/06/2015   CLINICAL DATA:  Hip fracture  EXAM: PORTABLE PELVIS 1-2 VIEWS  COMPARISON:  None.  FINDINGS:  Three cannulated screws transfixing left femoral neck fracture. No other acute fracture or dislocation. Mild osteoarthritis of bilateral hips. No lytic or sclerotic osseous lesion.  IMPRESSION: Three cannulated screws transfixing a left femoral neck fracture.   Electronically Signed   By: Kathreen Devoid   On: 04/06/2015 11:22   Dg Shoulder Left  04/05/2015   CLINICAL DATA:  [place] this morning over shoe box in her living room, put her LEFT arm out to brace the fall, LEFT shoulder pain  EXAM: LEFT SHOULDER - 2+ VIEW  COMPARISON:  None  FINDINGS: Mild osseous demineralization.  AC joint alignment normal.  Glenohumeral joint alignment normal.  No acute fracture, dislocation, or bone destruction.  Visualized LEFT ribs intact.  IMPRESSION: Osseous demineralization without acute abnormalities.   Electronically Signed   By: Lavonia Dana M.D.   On: 04/05/2015 13:10   Dg Hip Operative Unilat With Pelvis Left  04/06/2015   CLINICAL DATA:  Left hip surgery.  EXAM: OPERATIVE left HIP (WITH PELVIS IF PERFORMED) 2 VIEWS  TECHNIQUE: Fluoroscopic spot image(s) were submitted for interpretation post-operatively.  FLUOROSCOPY TIME:  Fluoroscopy Time:  0 min, 24 seconds  Number of Acquired Images:  2  COMPARISON:  None.  FINDINGS: Patient status post open reduction internal fixation of left femoral neck fracture. Good anatomic alignment. Hardware intact.  IMPRESSION: Open reduction internal fixation of left femoral neck fracture with good anatomic alignment.   Electronically Signed   By: Marcello Moores  Register   On:  04/06/2015 08:34   Dg Hip Unilat With Pelvis 2-3 Views Left  04/05/2015   CLINICAL DATA:  Tripped over a shoe box and fell backwards, LEFT hip pain  EXAM: LEFT HIP (WITH PELVIS) 2-3 VIEWS  COMPARISON:  None  FINDINGS: Diffuse osseous demineralization.  Minimal narrowing of the hips joints bilaterally.  SI joints symmetric.  Minimally displaced subcapital fracture LEFT femoral neck.  No dislocation.  Pelvis appears intact.  Scattered atherosclerotic calcifications and few pelvic phleboliths.  Slight prominent stool in rectum.  IMPRESSION: Osseous demineralization with a minimally displaced subcapital fracture LEFT femoral neck.   Electronically Signed   By: Lavonia Dana M.D.   On: 04/05/2015 10:55     CBC  Recent Labs Lab 04/05/15 1133 04/06/15 0452 04/07/15 0600  WBC 7.8 9.4 8.3  HGB 12.5 12.8 11.1*  HCT 40.6 39.1 34.6*  PLT 272 329 187  MCV 92.1 88.9 90.3  MCH 28.3 29.1 29.0  MCHC 30.8 32.7 32.1  RDW 13.8 14.0 14.0  LYMPHSABS 1.1  --   --   MONOABS 0.4  --   --   EOSABS 0.3  --   --   BASOSABS 0.0  --   --     Chemistries   Recent Labs Lab 04/05/15 1133 04/06/15 0452 04/07/15 0600  NA 137 139 137  K 4.0 4.1 4.2  CL 100 102 101  CO2 31 29 25   GLUCOSE 151* 73 139*  BUN 22 20 23   CREATININE 0.96 1.11* 1.12*  CALCIUM 8.7 8.7 8.1*   ------------------------------------------------------------------------------------------------------------------ estimated creatinine clearance is 31.5 mL/min (by C-G formula based on Cr of 1.12). ------------------------------------------------------------------------------------------------------------------ No results for input(s): HGBA1C in the last 72 hours. ------------------------------------------------------------------------------------------------------------------ No results for input(s): CHOL, HDL, LDLCALC, TRIG, CHOLHDL, LDLDIRECT in the last 72  hours. ------------------------------------------------------------------------------------------------------------------ No results for input(s): TSH, T4TOTAL, T3FREE, THYROIDAB in the last 72 hours.  Invalid input(s): FREET3 ------------------------------------------------------------------------------------------------------------------ No results for input(s): VITAMINB12, FOLATE, FERRITIN, TIBC, IRON, RETICCTPCT in the last 72 hours.  Coagulation profile  Recent Labs Lab 04/05/15 1133  INR 0.91    No results for input(s): DDIMER in the last 72 hours.  Cardiac Enzymes  Recent Labs Lab 04/05/15 1133  TROPONINI <0.03   ------------------------------------------------------------------------------------------------------------------ Invalid input(s): POCBNP     Time Spent in minutes  35   Diona Peregoy K M.D on 04/08/2015 at 10:08 AM  Between 7am to 7pm - Pager - [phone]  After 7pm go to [email or web address] - password [facility]  [facility]   Office  [phone]

## 2015-04-08 NOTE — Progress Notes (Signed)
Subjective: 2 Days Post-Op Procedure(s) (LRB): CANNULATED HIP PINNING (Left) Patient reports pain as mild.    Objective: Vital signs in last 24 hours: Temp:  [97.5 F (36.4 C)-100.1 F (37.8 C)] 99.3 F (37.4 C) (04/10 1316) Pulse Rate:  [90-107] 106 (04/10 1316) Resp:  [16-18] 16 (04/10 1514) BP: (84-125)/(48-57) 107/50 mmHg (04/10 1318) SpO2:  [92 %-98 %] 96 % (04/10 1316)  Intake/Output from previous day: 04/09 0701 - 04/10 0700 In: 480 [P.O.:480] Out: 1 [Urine:1] Intake/Output this shift: Total I/O In: 480 [P.O.:480] Out: -    Recent Labs  04/06/15 0452 04/07/15 0600  HGB 12.8 11.1*    Recent Labs  04/06/15 0452 04/07/15 0600  WBC 9.4 8.3  RBC 4.40 3.83*  HCT 39.1 34.6*  PLT 329 187    Recent Labs  04/06/15 0452 04/07/15 0600  NA 139 137  K 4.1 4.2  CL 102 101  CO2 29 25  BUN 20 23  CREATININE 1.11* 1.12*  GLUCOSE 73 139*  CALCIUM 8.7 8.1*   No results for input(s): LABPT, INR in the last 72 hours.  Intact pulses distally Dorsiflexion/Plantar flexion intact Incision: dressing C/D/I No cellulitis present  Assessment/Plan: 2 Days Post-Op Procedure(s) (LRB): CANNULATED HIP PINNING (Left) Up with therapy  WBAT LLE, walker, dvt proph plavix and asa, pain med as ordered dispo per med team will likely need SNF pt believes may d/c tomorrow  Jennifer Duran 04/08/2015, 3:56 PM

## 2015-04-09 ENCOUNTER — Telehealth: Payer: Self-pay | Admitting: *Deleted

## 2015-04-09 DIAGNOSIS — I10 Essential (primary) hypertension: Secondary | ICD-10-CM

## 2015-04-09 LAB — GLUCOSE, CAPILLARY
Glucose-Capillary: 104 mg/dL — ABNORMAL HIGH (ref 70–99)
Glucose-Capillary: 107 mg/dL — ABNORMAL HIGH (ref 70–99)

## 2015-04-09 MED ORDER — HYDROCODONE-ACETAMINOPHEN 5-325 MG PO TABS: 1.0000 | ORAL_TABLET | Freq: Four times a day (QID) | ORAL | Status: DC | PRN

## 2015-04-09 MED ORDER — CLONAZEPAM 0.5 MG PO TABS: 0.2500 mg | ORAL_TABLET | Freq: Two times a day (BID) | ORAL | Status: DC

## 2015-04-09 NOTE — Discharge Planning (Signed)
Patient to be discharged to [facility]. Patient and patient's son Remo Lipps) updated regarding discharge.  Facility: [facility] Report number: [phone] Transportation: EMS ([place])  Lubertha Sayres, [place] Cell: [phone]       Fax: [phone] Clinical Social Work: Orthopedics [phone]) and Surgical [phone])

## 2015-04-09 NOTE — Care Management Note (Signed)
CARE MANAGEMENT NOTE 04/09/2015 04/09/15 [place], RN BSN Case Manager Case manager notified by Raquel Sarna, social worker that Patient will go to [facility], will not go to [place] [facility] until after recovery.

## 2015-04-09 NOTE — Care Management Note (Signed)
CARE MANAGEMENT NOTE 04/09/2015  Patient:  Jennifer Duran, Jennifer Duran   Account Number:  [id number]  Date Initiated:  04/09/2015  Documentation initiated by:  Ricki Miller  Subjective/Objective Assessment:   81 yr old female admitted s/p fall with left hip fracture. Patient underwent a left hip pinning.     Action/Plan:   Patient will need shortterm rehab at SNF, wants to go to facility in [place] to be near her family. Social worker is aware.   Anticipated DC Date:  04/10/2015   Anticipated DC Plan:  SKILLED NURSING FACILITY  In-house referral  Clinical Social Worker      DC Planning Services  CM consult      Choice offered to / List presented to:     DME arranged  NA        [facility] arranged  NA      Status of service:  Completed, signed off Medicare Important Message given?  YES (If response is "NO", the following Medicare IM given date fields will be blank) Date Medicare IM given:  04/09/2015 Medicare IM given by:  Ricki Miller Date Additional Medicare IM given:   Additional Medicare IM given by:    Discharge Disposition:  [facility]  Per UR Regulation:  Reviewed for med. necessity/level of care/duration of stay

## 2015-04-09 NOTE — Discharge Instructions (Signed)
Keep dressing clean and dry till follow up. Weight bearing as tolerated in the left hip  ASA 325mg  PO daily for 30 days post op for DVT prophylaxis   Follow with Primary MD or SNF MD in 7 days   Get CBC, CMP, 2 view Chest X ray checked  by Primary MD next visit.    Activity: WBAT L Hip with Full fall precautions use walker/cane & assistance as needed   Disposition SNF   Diet: Heart Healthy Low carb.  For Heart failure patients - Check your Weight same time everyday, if you gain over 2 pounds, or you develop in leg swelling, experience more shortness of breath or chest pain, call your Primary MD immediately. Follow Cardiac Low Salt Diet and 1.5 lit/day fluid restriction.   On your next visit with your primary care physician please Get Medicines reviewed and adjusted.   Please request your Prim.MD to go over all Hospital Tests and Procedure/Radiological results at the follow up, please get all Hospital records sent to your Prim MD by signing hospital release before you go home.   If you experience worsening of your admission symptoms, develop shortness of breath, life threatening emergency, suicidal or homicidal thoughts you must seek medical attention immediately by calling 911 or calling your MD immediately  if symptoms less severe.  You Must read complete instructions/literature along with all the possible adverse reactions/side effects for all the Medicines you take and that have been prescribed to you. Take any new Medicines after you have completely understood and accpet all the possible adverse reactions/side effects.   Do not drive, operating heavy machinery, perform activities at heights, swimming or participation in water activities or provide baby sitting services if your were admitted for syncope or siezures until you have seen by Primary MD or a Neurologist and advised to do so again.  Do not drive when taking Pain medications.    Do not take more than prescribed Pain,  Sleep and Anxiety Medications  Special Instructions: If you have smoked or chewed Tobacco  in the last 2 yrs please stop smoking, stop any regular Alcohol  and or any Recreational drug use.  Wear Seat belts while driving.   Please note  You were cared for by a hospitalist during your hospital stay. If you have any questions about your discharge medications or the care you received while you were in the hospital after you are discharged, you can call the unit and asked to speak with the hospitalist on call if the hospitalist that took care of you is not available. Once you are discharged, your primary care physician will handle any further medical issues. Please note that NO REFILLS for any discharge medications will be authorized once you are discharged, as it is imperative that you return to your primary care physician (or establish a relationship with a primary care physician if you do not have one) for your aftercare needs so that they can reassess your need for medications and monitor your lab values.

## 2015-04-09 NOTE — Progress Notes (Signed)
Physical Therapy Treatment Patient Details Name: Jennifer Duran MRN: [id number] DOB: Aug 24, 1929 Today's Date: 04/09/2015    History of Present Illness Jennifer Duran is a 81 y.o. female who complains of Left hip pain after a mechanical fall today. She was found to have a femoral neck fracture of the left hip and underwent cannulated pinning on 04/06/15. She lives at an independent living facility and is ambulatory with a walker at baseline.PMH: DM, COPD, OP, PVD, lung mass, mastectomy, Hashimotos.    PT Comments    Pt is progressing toward goals and increasing ambulation distance, although she is still limited due to SOB and fatigue. Continue to work on progressive ambulation. Will need O2 tank for progressive ambulation.  Follow Up Recommendations  SNF;Supervision/Assistance - 24 hour     Equipment Recommendations  None recommended by PT    Recommendations for Other Services       Precautions / Restrictions Precautions Precautions: Fall Restrictions Weight Bearing Restrictions: Yes LLE Weight Bearing: Weight bearing as tolerated    Mobility  Bed Mobility Overal bed mobility: Needs Assistance Bed Mobility: Rolling;Supine to Sit Rolling: Min guard   Supine to sit: Min guard     General bed mobility comments: Min guard for safety. Use of rail to roll and push up to upright position.  Transfers Overall transfer level: Needs assistance Equipment used: Rolling walker (2 wheeled)   Sit to Stand: Min guard         General transfer comment: Min guard for safety. Cues for hand placement on bed instead of handles on walker.  Ambulation/Gait Ambulation/Gait assistance: Min guard Ambulation Distance (Feet): 20 Feet Assistive device: Rolling walker (2 wheeled) Gait Pattern/deviations: Step-to pattern   Gait velocity interpretation: Below normal speed for age/gender General Gait Details: Cues for gait sequence. Pt required standing rest break.   Stairs             Wheelchair Mobility    Modified Rankin (Stroke Patients Only)       Balance                                    Cognition Arousal/Alertness: Awake/alert Behavior During Therapy: WFL for tasks assessed/performed Overall Cognitive Status: History of cognitive impairments - at baseline                      Exercises General Exercises - Lower Extremity Ankle Circles/Pumps: AROM;Both;15 reps Quad Sets: AROM;Left;15 reps Long Arc Quad: AROM;Both;15 reps Heel Slides: AROM;Left;10 reps    General Comments        Pertinent Vitals/Pain Pain Assessment: No/denies pain Pain Intervention(s): Monitored during session    Home Living                      Prior Function            PT Goals (current goals can now be found in the care plan section) Progress towards PT goals: Progressing toward goals    Frequency  Min 3X/week    PT Plan Current plan remains appropriate    Co-evaluation             End of Session Equipment Utilized During Treatment: Oxygen Activity Tolerance: Patient tolerated treatment well Patient left: in chair;with call bell/phone within reach;with family/visitor present     Time: 4315-4008 PT Time Calculation (min) (ACUTE ONLY): 33 min  Charges:  G CodesRubye Duran, [place] 04/09/2015, 11:32 AM

## 2015-04-09 NOTE — Telephone Encounter (Signed)
On 04-09-15 [facility] it was consult note, end of tx  Note, follow up note.

## 2015-04-09 NOTE — Progress Notes (Signed)
     Subjective:  POD#3 left hip pinning. Patient reports pain as mild.  PT recommending SNF at d/c.  Patient would like to do this in [place] since she has family there.  Objective:   VITALS:   Filed Vitals:   04/08/15 1514 04/08/15 1950 04/08/15 2009 04/09/15 0420  BP:  94/55  121/68  Pulse:  97 93 76  Temp:  98.2 F (36.8 C)  97.7 F (36.5 C)  TempSrc:  Oral  Oral  Resp: 16 16 16 16   Weight:      SpO2:  91% 93% 95%    Neurologically intact ABD soft Neurovascular intact Sensation intact distally Intact pulses distally Dorsiflexion/Plantar flexion intact Incision: dressing C/D/I   Lab Results  Component Value Date   WBC 8.3 04/07/2015   HGB 11.1* 04/07/2015   HCT 34.6* 04/07/2015   MCV 90.3 04/07/2015   PLT 187 04/07/2015   BMET    Component Value Date/Time   NA 137 04/07/2015 0600   NA 142 12/01/2013 1508   K 4.2 04/07/2015 0600   CL 101 04/07/2015 0600   CO2 25 04/07/2015 0600   GLUCOSE 139* 04/07/2015 0600   GLUCOSE 155* 12/01/2013 1508   BUN 23 04/07/2015 0600   BUN 12 12/01/2013 1508   BUN 15.3 11/28/2013 1549   CREATININE 1.12* 04/07/2015 0600   CREATININE 0.9 11/28/2013 1549   CALCIUM 8.1* 04/07/2015 0600   GFRNONAA 44* 04/07/2015 0600   GFRAA 51* 04/07/2015 0600     Assessment/Plan: 3 Days Post-Op   Principal Problem:   Hip fracture, left Active Problems:   Essential hypertension   COPD with emphysema   Cor pulmonale   DM type 2, goal A1c below 7   Closed left hip fracture   Up with therapy WBAT in the LLE Plavix restarted post op ASA 325mg  daily for DVT prophylaxis PT recommending D/C to SNF, ok from ortho standpoint to go when cleared by medicine.   Danthony Kendrix Lelan Pons 04/09/2015, 8:04 AM Cell [phone]

## 2015-04-09 NOTE — Progress Notes (Signed)
Called report to [facility]. Gave report to [employer]

## 2015-04-09 NOTE — Discharge Summary (Signed)
Jennifer Duran, is a 81 y.o. female  DOB 03-Mar-1929  MRN [id number].  Admission date:  04/05/2015  Admitting Physician  Renette Butters, MD  Discharge Date:  04/09/2015   Primary MD  No primary care provider on file.  Recommendations for primary care physician for things to follow:   Needs outpatient pulmonary follow-up and continued follow-up with Dr.Kinrad her lung nodule monitoring and evaluation.  Check CBC, BMP in 3-4 days. Monitor glycemic control.   Admission Diagnosis  Fall [W19.XXXA] Closed left hip fracture, initial encounter [S72.002A]   Discharge Diagnosis  Fall [W19.XXXA] Closed left hip fracture, initial encounter [S72.002A]     Principal Problem:   Hip fracture, left Active Problems:   Essential hypertension   COPD with emphysema   Cor pulmonale   DM type 2, goal A1c below 7   Closed left hip fracture      Past Medical History  Diagnosis Date  . Allergic rhinitis, cause unspecified   . Unspecified essential hypertension   . Cor pulmonale   . COPD (chronic obstructive pulmonary disease)   . Osteoporosis   . Hashimoto's disease   . Peripheral vascular disease   . Hyperlipidemia   . Depression   . Urinary frequency   . Other atopic dermatitis and related conditions   . Abnormal involuntary movements(781.0)   . Hyposmolality and/or hyponatremia   . Peripheral vascular disease, unspecified   . Lung mass   . Insomnia   . History of radiation therapy 08/08/2013, 08/10/2013, 08/16/2013    SBRT 54 Gy to left upper lobe pulmonary nodule  . Type II or unspecified type diabetes mellitus without mention of complication, not stated as uncontrolled     Past Surgical History  Procedure Laterality Date  . Total abdominal hysterectomy    . Cataracts    . Rotator cuff repair    . Mastectomy  Right 1952  . Eye surgery    . Radiation implant, female Left Aug. 2014    Left Breast  3 Tx's       History of present illness and  Hospital Course:     Kindly see H&P for history of present illness and admission details, please review complete Labs, Consult reports and Test reports for all details in brief  HPI  from the history and physical done on the day of admission  Jennifer Duran is a 81 y.o. female from assisted living for about 1 yr. She tripped today after breakfast and fell on her left side and hit the back of her head. She called for help and was brought to the hospital. She is found to have a left hip fracture- pain is 2/10 as long as she does not move. Headache and neck ache is present but is also mild. CT head and neck negative for acute abnormalities. She also has left shoulder pain which is mild- no fracture noted on xrays of shoulder.   No complaint of chest pain on exertion or at rest. Dyspnea on exertion is no  worse than her baseline.     Hospital Course    1. Mechanical Fall with Hip fracture, left - post ORIF 04-06-15, ASA-Plavix for DVT prophylaxis per Ortho, WBAT on Left leg, PT and supportive Rx. Will need placement. H&H stable after mild peri op blood loss. Will need SNF placement.    2. COPD - on 2lit [place] o2 at home, Nebs scheduled and as needed, continue.    3.HTN - low-dose ACE inhibitor upon discharge along with Lasix.Marland Kitchen    4.DM2 - continue home regimen. Glycemic control was stable here  CBG (last 3)   Recent Labs (last 2 labs)      Recent Labs  04/07/15 1627 04/07/15 2124 04/08/15 0624  GLUCAP 153* 137* 119*       5.Anxiety and depression - home Meds.    6. Cor pulmonale - continue Lasix home dose, o2.    7. H/o PET positive nodule - pulm follow up suggested. Also following with Dr Luretha Rued        Discharge Condition: Stable   Follow UP  Follow-up Information    Follow up with [facility], MD. Schedule  an appointment as soon as possible for a visit in 1 month.   Specialty:  Pulmonary Disease   Why:  possible lung nodule on XRay   Contact information:   [place] [address] [phone]       Follow up with MURPHY, Ernesta Amble, MD In 1 week.   Specialty:  Orthopedic Surgery   Contact information:   [address] [place] [address] [phone]       Follow up with PCP. Schedule an appointment as soon as possible for a visit in 1 week.        Discharge Instructions  and  Discharge Medications          Discharge Instructions    Discharge instructions    Complete by:  As directed   Keep dressing clean and dry till follow up. Weight bearing as tolerated in the left hip  ASA 325mg  PO daily for 30 days post op for DVT prophylaxis   Follow with Primary MD or SNF MD in 7 days   Get CBC, CMP, 2 view Chest X ray checked  by Primary MD next visit.    Activity: WBAT L Hip with Full fall precautions use walker/cane & assistance as needed   Disposition SNF   Diet: Heart Healthy Low carb.  For Heart failure patients - Check your Weight same time everyday, if you gain over 2 pounds, or you develop in leg swelling, experience more shortness of breath or chest pain, call your Primary MD immediately. Follow Cardiac Low Salt Diet and 1.5 lit/day fluid restriction.   On your next visit with your primary care physician please Get Medicines reviewed and adjusted.   Please request your Prim.MD to go over all Hospital Tests and Procedure/Radiological results at the follow up, please get all Hospital records sent to your Prim MD by signing hospital release before you go home.   If you experience worsening of your admission symptoms, develop shortness of breath, life threatening emergency, suicidal or homicidal thoughts you must seek medical attention immediately by calling 911 or calling your MD immediately  if symptoms less severe.  You Must read  complete instructions/literature along with all the possible adverse reactions/side effects for all the Medicines you take and that have been prescribed to you. Take any new Medicines after you have  completely understood and accpet all the possible adverse reactions/side effects.   Do not drive, operating heavy machinery, perform activities at heights, swimming or participation in water activities or provide baby sitting services if your were admitted for syncope or siezures until you have seen by Primary MD or a Neurologist and advised to do so again.  Do not drive when taking Pain medications.    Do not take more than prescribed Pain, Sleep and Anxiety Medications  Special Instructions: If you have smoked or chewed Tobacco  in the last 2 yrs please stop smoking, stop any regular Alcohol  and or any Recreational drug use.  Wear Seat belts while driving.   Please note  You were cared for by a hospitalist during your hospital stay. If you have any questions about your discharge medications or the care you received while you were in the hospital after you are discharged, you can call the unit and asked to speak with the hospitalist on call if the hospitalist that took care of you is not available. Once you are discharged, your primary care physician will handle any further medical issues. Please note that NO REFILLS for any discharge medications will be authorized once you are discharged, as it is imperative that you return to your primary care physician (or establish a relationship with a primary care physician if you do not have one) for your aftercare needs so that they can reassess your need for medications and monitor your lab values.     Increase activity slowly    Complete by:  As directed      Weight bearing as tolerated    Complete by:  As directed   Laterality:  left  Extremity:  Lower            Medication List    TAKE these medications        albuterol (2.5 MG/3ML) 0.083%  nebulizer solution  Commonly known as:  PROVENTIL  Take 2.5 mg by nebulization every 6 (six) hours as needed for wheezing or shortness of breath.     albuterol 108 (90 BASE) MCG/ACT inhaler  Commonly known as:  PROVENTIL HFA;VENTOLIN HFA  Inhale 2 puffs into the lungs every 4 (four) hours as needed for wheezing or shortness of breath.     aspirin 325 MG tablet  Take 1 tablet (325 mg total) by mouth daily.     citalopram 20 MG tablet  Commonly known as:  CELEXA  Take 20 mg by mouth daily.     clonazePAM 0.5 MG tablet  Commonly known as:  KLONOPIN  - Take 0.5-1 tablets (0.25-0.5 mg total) by mouth 2 (two) times daily. 0.25mg  in the am  - 0.5mg  in the PM     clopidogrel 75 MG tablet  Commonly known as:  PLAVIX  Take 75 mg by mouth daily with breakfast.     docusate sodium 100 MG capsule  Commonly known as:  COLACE  Take 1 capsule (100 mg total) by mouth 2 (two) times daily.     ferrous sulfate 325 (65 FE) MG tablet  Take 325 mg by mouth daily.     Fluticasone-Salmeterol 250-50 MCG/DOSE Aepb  Commonly known as:  ADVAIR  Inhale 1 puff into the lungs 2 (two) times daily.     furosemide 40 MG tablet  Commonly known as:  LASIX  Take 40 mg by mouth daily.     HYDROcodone-acetaminophen 5-325 MG per tablet  Commonly known as:  NORCO  Take 1-2  tablets by mouth every 6 (six) hours as needed for moderate pain.     insulin aspart 100 UNIT/ML injection  Commonly known as:  novoLOG  Inject 5 Units into the skin 3 (three) times daily before meals. Give an additional 5 units if CBG > 150. Notify MD if CBG < 60 or > 40. Hold if BS < 70     insulin glargine 100 UNIT/ML injection  Commonly known as:  LANTUS  Inject 10 Units into the skin at bedtime.     ipratropium 0.02 % nebulizer solution  Commonly known as:  ATROVENT  Take 0.5 mg by nebulization every 4 (four) hours as needed for wheezing or shortness of breath.     lisinopril 5 MG tablet  Commonly known as:  PRINIVIL,ZESTRIL    Take 2.5 mg by mouth daily. HOLD if systolic blood pressure is less than 935 or diastolic BP is less than 60     loratadine 10 MG tablet  Commonly known as:  CLARITIN  Take 10 mg by mouth daily.     ondansetron 4 MG disintegrating tablet  Commonly known as:  ZOFRAN ODT  Take 1 tablet (4 mg total) by mouth every 8 (eight) hours as needed for nausea.     tiotropium 18 MCG inhalation capsule  Commonly known as:  SPIRIVA  Place 18 mcg into inhaler and inhale daily.     traMADol 50 MG tablet  Commonly known as:  ULTRAM  Take 100 mg by mouth at bedtime.     Vitamin D-3 1000 UNITS Caps  Take 1 capsule by mouth daily.          Diet and Activity recommendation: See Discharge Instructions above   Consults obtained - Ortho   Major procedures and Radiology Reports - PLEASE review detailed and final reports for all details, in brief -       Dg Chest 1 View  04/05/2015   CLINICAL DATA:  Preoperative evaluation, hip fracture, history COPD, Hashimoto's disease, cor pulmonale, lung mass post radiation, diabetes  EXAM: CHEST  1 VIEW  COMPARISON:  03/22/2015  FINDINGS: Normal heart size, mediastinal contours and pulmonary vascularity.  Atherosclerotic calcifications aorta.  Emphysematous and bronchitic changes consistent with COPD.  Scattered interstitial prominence in mid to lower lungs.  Vague LEFT upper lobe density again identified, increased versus previous exam, though slightly more similar to the earlier study of 06/08/2014  No acute infiltrate, pleural effusion or pneumothorax.  Bones demineralized.  IMPRESSION: COPD changes with scattered interstitial disease and a vague persistent area of increased attenuation in the LEFT upper lobe question slightly increased in counts acuity; recommend CT chest to exclude recurrent nodule.   Electronically Signed   By: Lavonia Dana M.D.   On: 04/05/2015 13:16   Dg Chest 2 View  03/22/2015   CLINICAL DATA:  Shortness of breath.  EXAM: CHEST  2 VIEW   COMPARISON:  CT 08/08/2014.  Chest x-ray 06/08/2014, 02/25/ 2015.  FINDINGS: Mediastinum and hilar structures are normal. Stable apical pleural parenchymal thickening consistent with scarring. Stable interstitial prominence noted consistent with chronic interstitial lung disease. COPD. Heart size normal. No acute bony abnormality.  IMPRESSION: 1. Stable biapical pleural parenchymal scarring. No recurrent mass. Left apical density is unchanged. 2. Chronic interstitial lung disease and COPD.   Electronically Signed   By: Marcello Moores  Register   On: 03/22/2015 14:05   Ct Head Wo Contrast  04/05/2015   CLINICAL DATA:  Tripped and fall with injury to posterior  skull. Headache and neck pain after injury. Initial encounter.  EXAM: CT HEAD WITHOUT CONTRAST  CT CERVICAL SPINE WITHOUT CONTRAST  TECHNIQUE: Multidetector CT imaging of the head and cervical spine was performed following the standard protocol without intravenous contrast. Multiplanar CT image reconstructions of the cervical spine were also generated.  COMPARISON:  None.  FINDINGS: CT HEAD FINDINGS  There is evidence of small vessel disease in the periventricular white matter. The brain demonstrates no evidence of hemorrhage, infarction, edema, mass effect, extra-axial fluid collection, hydrocephalus or mass lesion. The skull is unremarkable and shows no evidence of fracture.  CT CERVICAL SPINE FINDINGS  The cervical spine shows normal alignment. There is no evidence of acute fracture or subluxation. No soft tissue swelling or hematoma is identified. No bony or soft tissue lesions are seen. Diffuse spondylosis of the cervical spine present at nearly every level. There is an associated mild anterolisthesis of C4 on C5 which is likely degenerative and measures approximately 3 mm. No evidence of acute fracture, traumatic subluxation or soft tissue swelling. The visualized airway is normally patent. Visualized lung apices demonstrate evidence of emphysema. There is  incidental calcified plaque noted at both carotid bifurcations.  IMPRESSION: No acute head injury or cervical spine injury. Evidence of small vessel disease in the periventricular white matter. Diffuse spondylosis of the cervical spine present. Incidental carotid atherosclerosis.   Electronically Signed   By: Aletta Edouard M.D.   On: 04/05/2015 12:18   Ct Cervical Spine Wo Contrast  04/05/2015   CLINICAL DATA:  Tripped and fall with injury to posterior skull. Headache and neck pain after injury. Initial encounter.  EXAM: CT HEAD WITHOUT CONTRAST  CT CERVICAL SPINE WITHOUT CONTRAST  TECHNIQUE: Multidetector CT imaging of the head and cervical spine was performed following the standard protocol without intravenous contrast. Multiplanar CT image reconstructions of the cervical spine were also generated.  COMPARISON:  None.  FINDINGS: CT HEAD FINDINGS  There is evidence of small vessel disease in the periventricular white matter. The brain demonstrates no evidence of hemorrhage, infarction, edema, mass effect, extra-axial fluid collection, hydrocephalus or mass lesion. The skull is unremarkable and shows no evidence of fracture.  CT CERVICAL SPINE FINDINGS  The cervical spine shows normal alignment. There is no evidence of acute fracture or subluxation. No soft tissue swelling or hematoma is identified. No bony or soft tissue lesions are seen. Diffuse spondylosis of the cervical spine present at nearly every level. There is an associated mild anterolisthesis of C4 on C5 which is likely degenerative and measures approximately 3 mm. No evidence of acute fracture, traumatic subluxation or soft tissue swelling. The visualized airway is normally patent. Visualized lung apices demonstrate evidence of emphysema. There is incidental calcified plaque noted at both carotid bifurcations.  IMPRESSION: No acute head injury or cervical spine injury. Evidence of small vessel disease in the periventricular white matter. Diffuse  spondylosis of the cervical spine present. Incidental carotid atherosclerosis.   Electronically Signed   By: Aletta Edouard M.D.   On: 04/05/2015 12:18   Pelvis Portable  04/06/2015   CLINICAL DATA:  Hip fracture  EXAM: PORTABLE PELVIS 1-2 VIEWS  COMPARISON:  None.  FINDINGS: Three cannulated screws transfixing left femoral neck fracture. No other acute fracture or dislocation. Mild osteoarthritis of bilateral hips. No lytic or sclerotic osseous lesion.  IMPRESSION: Three cannulated screws transfixing a left femoral neck fracture.   Electronically Signed   By: Kathreen Devoid   On: 04/06/2015 11:22   Dg  Shoulder Left  04/05/2015   CLINICAL DATA:  [place] this morning over shoe box in her living room, put her LEFT arm out to brace the fall, LEFT shoulder pain  EXAM: LEFT SHOULDER - 2+ VIEW  COMPARISON:  None  FINDINGS: Mild osseous demineralization.  AC joint alignment normal.  Glenohumeral joint alignment normal.  No acute fracture, dislocation, or bone destruction.  Visualized LEFT ribs intact.  IMPRESSION: Osseous demineralization without acute abnormalities.   Electronically Signed   By: Lavonia Dana M.D.   On: 04/05/2015 13:10   Dg Hip Operative Unilat With Pelvis Left  04/06/2015   CLINICAL DATA:  Left hip surgery.  EXAM: OPERATIVE left HIP (WITH PELVIS IF PERFORMED) 2 VIEWS  TECHNIQUE: Fluoroscopic spot image(s) were submitted for interpretation post-operatively.  FLUOROSCOPY TIME:  Fluoroscopy Time:  0 min, 24 seconds  Number of Acquired Images:  2  COMPARISON:  None.  FINDINGS: Patient status post open reduction internal fixation of left femoral neck fracture. Good anatomic alignment. Hardware intact.  IMPRESSION: Open reduction internal fixation of left femoral neck fracture with good anatomic alignment.   Electronically Signed   By: Marcello Moores  Register   On: 04/06/2015 08:34   Dg Hip Unilat With Pelvis 2-3 Views Left  04/05/2015   CLINICAL DATA:  Tripped over a shoe box and fell backwards, LEFT hip pain   EXAM: LEFT HIP (WITH PELVIS) 2-3 VIEWS  COMPARISON:  None  FINDINGS: Diffuse osseous demineralization.  Minimal narrowing of the hips joints bilaterally.  SI joints symmetric.  Minimally displaced subcapital fracture LEFT femoral neck.  No dislocation.  Pelvis appears intact.  Scattered atherosclerotic calcifications and few pelvic phleboliths.  Slight prominent stool in rectum.  IMPRESSION: Osseous demineralization with a minimally displaced subcapital fracture LEFT femoral neck.   Electronically Signed   By: Lavonia Dana M.D.   On: 04/05/2015 10:55    Micro Results      Recent Results (from the past 240 hour(s))  Surgical pcr screen     Status: Abnormal   Collection Time: 04/05/15  4:00 PM  Result Value Ref Range Status   MRSA, PCR POSITIVE (A) NEGATIVE Final   Staphylococcus aureus POSITIVE (A) NEGATIVE Final    Comment:        The Xpert SA Assay (FDA approved for NASAL specimens in patients over 53 years of age), is one component of a comprehensive surveillance program.  Test performance has been validated by [facility] for patients greater than or equal to 5 year old. It is not intended to diagnose infection nor to guide or monitor treatment.        Today   Subjective:   Tameah Mihalko today has no headache,no chest abdominal pain,no new weakness tingling or numbness, feels much better.  Objective:   Blood pressure 121/68, pulse 76, temperature 97.7 F (36.5 C), temperature source Oral, resp. rate 16, weight 56.246 kg (124 lb), SpO2 96 %.   Intake/Output Summary (Last 24 hours) at 04/09/15 1053 Last data filed at 04/09/15 0900  Gross per 24 hour  Intake    720 ml  Output      0 ml  Net    720 ml    Exam Awake Alert, Oriented x 3, No new F.N deficits, Normal affect .AT,PERRAL Supple Neck,No JVD, No cervical lymphadenopathy appriciated.  Symmetrical Chest wall movement, Good air movement bilaterally, CTAB RRR,No Gallops,Rubs or new Murmurs, No  Parasternal Heave +ve B.Sounds, Abd Soft, Non tender, No organomegaly appriciated, No rebound -guarding  or rigidity. No Cyanosis, Clubbing or edema, No new Rash or bruise, left hip incision site clean  Data Review   CBC w Diff:  Lab Results  Component Value Date   WBC 8.3 04/07/2015   WBC 8.0 12/01/2013   HGB 11.1* 04/07/2015   HCT 34.6* 04/07/2015   PLT 187 04/07/2015   LYMPHOPCT 15 04/05/2015   MONOPCT 5 04/05/2015   EOSPCT 3 04/05/2015   BASOPCT 0 04/05/2015    CMP:  Lab Results  Component Value Date   NA 137 04/07/2015   NA 142 12/01/2013   K 4.2 04/07/2015   CL 101 04/07/2015   CO2 25 04/07/2015   BUN 23 04/07/2015   BUN 12 12/01/2013   BUN 15.3 11/28/2013   CREATININE 1.12* 04/07/2015   CREATININE 0.9 11/28/2013   PROT 8.1 02/23/2014   PROT 6.1 12/01/2013   ALBUMIN 3.4* 02/23/2014   BILITOT 0.4 02/23/2014   ALKPHOS 60 02/23/2014   AST 28 02/23/2014   ALT 8 02/23/2014  .   Total Time in preparing paper work, data evaluation and todays exam - 35 minutes  Thurnell Lose M.D on 04/09/2015 at 10:53 AM  [facility]   Office  [phone]

## 2015-04-10 ENCOUNTER — Non-Acute Institutional Stay (SKILLED_NURSING_FACILITY): Payer: Medicare HMO | Admitting: Adult Health

## 2015-04-10 ENCOUNTER — Encounter (HOSPITAL_COMMUNITY): Payer: Self-pay | Admitting: Orthopedic Surgery

## 2015-04-10 DIAGNOSIS — K5901 Slow transit constipation: Secondary | ICD-10-CM

## 2015-04-10 DIAGNOSIS — J309 Allergic rhinitis, unspecified: Secondary | ICD-10-CM

## 2015-04-10 DIAGNOSIS — F32A Depression, unspecified: Secondary | ICD-10-CM

## 2015-04-10 DIAGNOSIS — I10 Essential (primary) hypertension: Secondary | ICD-10-CM | POA: Diagnosis not present

## 2015-04-10 DIAGNOSIS — F419 Anxiety disorder, unspecified: Secondary | ICD-10-CM | POA: Diagnosis not present

## 2015-04-10 DIAGNOSIS — F329 Major depressive disorder, single episode, unspecified: Secondary | ICD-10-CM | POA: Diagnosis not present

## 2015-04-10 DIAGNOSIS — J439 Emphysema, unspecified: Secondary | ICD-10-CM | POA: Diagnosis not present

## 2015-04-10 DIAGNOSIS — S72002S Fracture of unspecified part of neck of left femur, sequela: Secondary | ICD-10-CM

## 2015-04-10 DIAGNOSIS — I2781 Cor pulmonale (chronic): Secondary | ICD-10-CM | POA: Diagnosis not present

## 2015-04-10 DIAGNOSIS — D62 Acute posthemorrhagic anemia: Secondary | ICD-10-CM

## 2015-04-10 DIAGNOSIS — R918 Other nonspecific abnormal finding of lung field: Secondary | ICD-10-CM | POA: Diagnosis not present

## 2015-04-10 DIAGNOSIS — E119 Type 2 diabetes mellitus without complications: Secondary | ICD-10-CM | POA: Diagnosis not present

## 2015-04-10 NOTE — Progress Notes (Signed)
Patient ID: Jennifer Duran, female   DOB: 1929-04-18, 81 y.o.   MRN: [id number]    04/10/2015  Facility:  [facility]ursing Home Location:  [place] Room Number: 1202-P LEVEL OF CARE:  SNF (31)   Chief Complaint  Patient presents with  . Hospitalization Follow-up    Left hip fracture S/P ORIF, anemia, COPD, hypertension, diabetes mellitus, anxiety, allergic rhinitis, cor pulmonale, lung mass, depression and constipation    HISTORY OF PRESENT ILLNESS:  This is an 81 year old female who has been admitted to [facility] on 04/09/15 from [facility] who tripped and fell @ the ALF where she lives. She sustained a left hip fracture and had ORIF done on 04/06/15. She has PMH of COPD, cor pulmonale, hypertension, allergic rhinitis and depression.  He has been admitted for a short-term rehabilitation.  PAST MEDICAL HISTORY:  Past Medical History  Diagnosis Date  . Allergic rhinitis, cause unspecified   . Unspecified essential hypertension   . Cor pulmonale   . COPD (chronic obstructive pulmonary disease)   . Osteoporosis   . Hashimoto's disease   . Peripheral vascular disease   . Hyperlipidemia   . Depression   . Urinary frequency   . Other atopic dermatitis and related conditions   . Abnormal involuntary movements(781.0)   . Hyposmolality and/or hyponatremia   . Peripheral vascular disease, unspecified   . Lung mass   . Insomnia   . History of radiation therapy 08/08/2013, 08/10/2013, 08/16/2013    SBRT 54 Gy to left upper lobe pulmonary nodule  . Type II or unspecified type diabetes mellitus without mention of complication, not stated as uncontrolled     CURRENT MEDICATIONS: Reviewed per MAR/see medication list  Allergies  Allergen Reactions  . Celecoxib Swelling  . Codeine Nausea And Vomiting  . Metronidazole Other (See Comments)    neuropathy  . Pregabalin Swelling  . Propranolol Hcl Other (See Comments)    Unknown  . Rofecoxib Other (See  Comments)    Unknown  . Iodine Rash    Only if ingested  . Pregabalin Hives and Rash     REVIEW OF SYSTEMS:  GENERAL: no change in appetite, no fatigue, no weight changes, no fever, chills or weakness RESPIRATORY: no cough, SOB, DOE, wheezing, hemoptysis CARDIAC: no chest pain, edema or palpitations GI: no abdominal pain, diarrhea, heart burn, nausea or vomiting, + constipation  PHYSICAL EXAMINATION  GENERAL: no acute distress, normal body habitus EYES: conjunctivae normal, sclerae normal, normal eye lids NECK: supple, trachea midline, no neck masses, no thyroid tenderness, no thyromegaly LYMPHATICS: no LAN in the neck, no supraclavicular LAN RESPIRATORY: breathing is even & unlabored, BS CTAB CARDIAC: RRR, no murmur,no extra heart sounds, no edema GI: abdomen soft, normal BS, no masses, no tenderness, no hepatomegaly, no splenomegaly EXTREMITIES:   able to move 4 extremities  PSYCHIATRIC: the patient is alert & oriented to person, affect & behavior appropriate  LABS/RADIOLOGY: Labs reviewed: Basic Metabolic Panel:  Recent Labs  04/05/15 1133 04/06/15 0452 04/07/15 0600  NA 137 139 137  K 4.0 4.1 4.2  CL 100 102 101  CO2 31 29 25   GLUCOSE 151* 73 139*  BUN 22 20 23   CREATININE 0.96 1.11* 1.12*  CALCIUM 8.7 8.7 8.1*   CBC:  Recent Labs  04/05/15 1133 04/06/15 0452 04/07/15 0600  WBC 7.8 9.4 8.3  NEUTROABS 6.0  --   --   HGB 12.5 12.8 11.1*  HCT 40.6 39.1 34.6*  MCV 92.1 88.9 90.3  PLT 272 329 187   Cardiac Enzymes:  Recent Labs  04/05/15 1133  TROPONINI <0.03   CBG:  Recent Labs  04/08/15 2139 04/09/15 0635 04/09/15 1142  GLUCAP 129* 104* 107*     Dg Chest 1 View  04/05/2015   CLINICAL DATA:  Preoperative evaluation, hip fracture, history COPD, Hashimoto's disease, cor pulmonale, lung mass post radiation, diabetes  EXAM: CHEST  1 VIEW  COMPARISON:  03/22/2015  FINDINGS: Normal heart size, mediastinal contours and pulmonary vascularity.   Atherosclerotic calcifications aorta.  Emphysematous and bronchitic changes consistent with COPD.  Scattered interstitial prominence in mid to lower lungs.  Vague LEFT upper lobe density again identified, increased versus previous exam, though slightly more similar to the earlier study of 06/08/2014  No acute infiltrate, pleural effusion or pneumothorax.  Bones demineralized.  IMPRESSION: COPD changes with scattered interstitial disease and a vague persistent area of increased attenuation in the LEFT upper lobe question slightly increased in counts acuity; recommend CT chest to exclude recurrent nodule.   Electronically Signed   By: Lavonia Dana M.D.   On: 04/05/2015 13:16   Dg Chest 2 View  03/22/2015   CLINICAL DATA:  Shortness of breath.  EXAM: CHEST  2 VIEW  COMPARISON:  CT 08/08/2014.  Chest x-ray 06/08/2014, 02/25/ 2015.  FINDINGS: Mediastinum and hilar structures are normal. Stable apical pleural parenchymal thickening consistent with scarring. Stable interstitial prominence noted consistent with chronic interstitial lung disease. COPD. Heart size normal. No acute bony abnormality.  IMPRESSION: 1. Stable biapical pleural parenchymal scarring. No recurrent mass. Left apical density is unchanged. 2. Chronic interstitial lung disease and COPD.   Electronically Signed   By: Marcello Moores  Register   On: 03/22/2015 14:05   Ct Head Wo Contrast  04/05/2015   CLINICAL DATA:  Tripped and fall with injury to posterior skull. Headache and neck pain after injury. Initial encounter.  EXAM: CT HEAD WITHOUT CONTRAST  CT CERVICAL SPINE WITHOUT CONTRAST  TECHNIQUE: Multidetector CT imaging of the head and cervical spine was performed following the standard protocol without intravenous contrast. Multiplanar CT image reconstructions of the cervical spine were also generated.  COMPARISON:  None.  FINDINGS: CT HEAD FINDINGS  There is evidence of small vessel disease in the periventricular white matter. The brain demonstrates no  evidence of hemorrhage, infarction, edema, mass effect, extra-axial fluid collection, hydrocephalus or mass lesion. The skull is unremarkable and shows no evidence of fracture.  CT CERVICAL SPINE FINDINGS  The cervical spine shows normal alignment. There is no evidence of acute fracture or subluxation. No soft tissue swelling or hematoma is identified. No bony or soft tissue lesions are seen. Diffuse spondylosis of the cervical spine present at nearly every level. There is an associated mild anterolisthesis of C4 on C5 which is likely degenerative and measures approximately 3 mm. No evidence of acute fracture, traumatic subluxation or soft tissue swelling. The visualized airway is normally patent. Visualized lung apices demonstrate evidence of emphysema. There is incidental calcified plaque noted at both carotid bifurcations.  IMPRESSION: No acute head injury or cervical spine injury. Evidence of small vessel disease in the periventricular white matter. Diffuse spondylosis of the cervical spine present. Incidental carotid atherosclerosis.   Electronically Signed   By: Aletta Edouard M.D.   On: 04/05/2015 12:18   Ct Cervical Spine Wo Contrast  04/05/2015   CLINICAL DATA:  Tripped and fall with injury to posterior skull. Headache and neck pain after injury. Initial encounter.  EXAM: CT HEAD WITHOUT CONTRAST  CT CERVICAL SPINE WITHOUT CONTRAST  TECHNIQUE: Multidetector CT imaging of the head and cervical spine was performed following the standard protocol without intravenous contrast. Multiplanar CT image reconstructions of the cervical spine were also generated.  COMPARISON:  None.  FINDINGS: CT HEAD FINDINGS  There is evidence of small vessel disease in the periventricular white matter. The brain demonstrates no evidence of hemorrhage, infarction, edema, mass effect, extra-axial fluid collection, hydrocephalus or mass lesion. The skull is unremarkable and shows no evidence of fracture.  CT CERVICAL SPINE FINDINGS   The cervical spine shows normal alignment. There is no evidence of acute fracture or subluxation. No soft tissue swelling or hematoma is identified. No bony or soft tissue lesions are seen. Diffuse spondylosis of the cervical spine present at nearly every level. There is an associated mild anterolisthesis of C4 on C5 which is likely degenerative and measures approximately 3 mm. No evidence of acute fracture, traumatic subluxation or soft tissue swelling. The visualized airway is normally patent. Visualized lung apices demonstrate evidence of emphysema. There is incidental calcified plaque noted at both carotid bifurcations.  IMPRESSION: No acute head injury or cervical spine injury. Evidence of small vessel disease in the periventricular white matter. Diffuse spondylosis of the cervical spine present. Incidental carotid atherosclerosis.   Electronically Signed   By: Aletta Edouard M.D.   On: 04/05/2015 12:18   Pelvis Portable  04/06/2015   CLINICAL DATA:  Hip fracture  EXAM: PORTABLE PELVIS 1-2 VIEWS  COMPARISON:  None.  FINDINGS: Three cannulated screws transfixing left femoral neck fracture. No other acute fracture or dislocation. Mild osteoarthritis of bilateral hips. No lytic or sclerotic osseous lesion.  IMPRESSION: Three cannulated screws transfixing a left femoral neck fracture.   Electronically Signed   By: Kathreen Devoid   On: 04/06/2015 11:22   Dg Shoulder Left  04/05/2015   CLINICAL DATA:  [place] this morning over shoe box in her living room, put her LEFT arm out to brace the fall, LEFT shoulder pain  EXAM: LEFT SHOULDER - 2+ VIEW  COMPARISON:  None  FINDINGS: Mild osseous demineralization.  AC joint alignment normal.  Glenohumeral joint alignment normal.  No acute fracture, dislocation, or bone destruction.  Visualized LEFT ribs intact.  IMPRESSION: Osseous demineralization without acute abnormalities.   Electronically Signed   By: Lavonia Dana M.D.   On: 04/05/2015 13:10   Dg Hip Operative Unilat With  Pelvis Left  04/06/2015   CLINICAL DATA:  Left hip surgery.  EXAM: OPERATIVE left HIP (WITH PELVIS IF PERFORMED) 2 VIEWS  TECHNIQUE: Fluoroscopic spot image(s) were submitted for interpretation post-operatively.  FLUOROSCOPY TIME:  Fluoroscopy Time:  0 min, 24 seconds  Number of Acquired Images:  2  COMPARISON:  None.  FINDINGS: Patient status post open reduction internal fixation of left femoral neck fracture. Good anatomic alignment. Hardware intact.  IMPRESSION: Open reduction internal fixation of left femoral neck fracture with good anatomic alignment.   Electronically Signed   By: Marcello Moores  Register   On: 04/06/2015 08:34   Dg Hip Unilat With Pelvis 2-3 Views Left  04/05/2015   CLINICAL DATA:  Tripped over a shoe box and fell backwards, LEFT hip pain  EXAM: LEFT HIP (WITH PELVIS) 2-3 VIEWS  COMPARISON:  None  FINDINGS: Diffuse osseous demineralization.  Minimal narrowing of the hips joints bilaterally.  SI joints symmetric.  Minimally displaced subcapital fracture LEFT femoral neck.  No dislocation.  Pelvis appears intact.  Scattered atherosclerotic calcifications  and few pelvic phleboliths.  Slight prominent stool in rectum.  IMPRESSION: Osseous demineralization with a minimally displaced subcapital fracture LEFT femoral neck.   Electronically Signed   By: Lavonia Dana M.D.   On: 04/05/2015 10:55    ASSESSMENT/PLAN:  Left hip fracture S/P ORIF - for rehabilitation; follow-up with Dr. Edmonia Lynch in 1 week; continue aspirin 325 mg by mouth daily 30 days for DVT prophylaxis; and Norco 5/325 mg 1-2 tabs by mouth every 6 hours when necessary for pain Anemia, acute blood loss - hemoglobin 11.1; continue ferrous sulfate 325 mg by mouth daily COPD - continue Advair 25/50 g 1 puff into Longs twice a day, Spiriva 18 g into lungs 2 puffs daily, albuterol med every 6 hours when necessary and Atrovent neb every 6 hours when necessary; follow-up with Dr. Brand Males in 1 month Hypertension - well  controlled; continue lisinopril 2.5 mg by mouth daily and Lasix 40 mg daily; check BP twice a day 1 week Diabetes mellitus, type II - continue NovoLog 5 units 3 times a day and Lantus 10 units subcutaneous daily at bedtime Anxiety - mood is stable; continue clonazepam 0.25 mg by mouth every morning and 0.5 mg by mouth every afternoon Allergic rhinitis - continue Claritin 10 mg by mouth daily Cor pulmonale - stable; continue Lasix 40 mg by mouth daily and O2 Lung mass - follow-up with Dr. Gery Pray, oncology Depression - continue Celexa 20 mg by mouth daily Constipation - discontinue Colace and start senna S2 tabs by mouth twice a day     Goals of care:  Short-term rehabilitation   Labs/test ordered:   For CBC and BMP in 4 days  Spent 50 minutes in patient care.   [facility], NP [employer] [phone]

## 2015-04-11 ENCOUNTER — Non-Acute Institutional Stay (SKILLED_NURSING_FACILITY): Payer: Medicare HMO | Admitting: Internal Medicine

## 2015-04-11 ENCOUNTER — Encounter: Payer: Self-pay | Admitting: Internal Medicine

## 2015-04-11 DIAGNOSIS — K625 Hemorrhage of anus and rectum: Secondary | ICD-10-CM | POA: Diagnosis not present

## 2015-04-11 DIAGNOSIS — F329 Major depressive disorder, single episode, unspecified: Secondary | ICD-10-CM

## 2015-04-11 DIAGNOSIS — S72002S Fracture of unspecified part of neck of left femur, sequela: Secondary | ICD-10-CM | POA: Diagnosis not present

## 2015-04-11 DIAGNOSIS — F32A Depression, unspecified: Secondary | ICD-10-CM

## 2015-04-11 DIAGNOSIS — D62 Acute posthemorrhagic anemia: Secondary | ICD-10-CM

## 2015-04-11 DIAGNOSIS — J439 Emphysema, unspecified: Secondary | ICD-10-CM

## 2015-04-11 DIAGNOSIS — K5900 Constipation, unspecified: Secondary | ICD-10-CM

## 2015-04-11 DIAGNOSIS — E119 Type 2 diabetes mellitus without complications: Secondary | ICD-10-CM

## 2015-04-11 DIAGNOSIS — I10 Essential (primary) hypertension: Secondary | ICD-10-CM

## 2015-04-11 DIAGNOSIS — F411 Generalized anxiety disorder: Secondary | ICD-10-CM | POA: Diagnosis not present

## 2015-04-11 NOTE — Progress Notes (Signed)
Patient ID: TANAYSIA BHARDWAJ, female   DOB: 1929/02/14, 81 y.o.   MRN: [id number]     [facility]   PCP: No primary care provider on file.  Code Status: DNR  Allergies  Allergen Reactions  . Celecoxib Swelling  . Codeine Nausea And Vomiting  . Metronidazole Other (See Comments)    neuropathy  . Pregabalin Swelling  . Propranolol Hcl Other (See Comments)    Unknown  . Rofecoxib Other (See Comments)    Unknown  . Iodine Rash    Only if ingested  . Pregabalin Hives and Rash    Chief Complaint  Patient presents with  . New Admit To SNF     HPI:  81 year old patient is here for short term rehabilitation post hospital admission from 04/05/15-04/09/15 with a fall and left hip closed fracture. She underwent ORIF. She has PMH of HTN, DM, copd on o2. She is seen in her room today. Her pain is under control. She was not feeling good yesterday with abdominal pain and nausea but feels better today after having a bowel movement. She was noted to have some frank blood with stool yesterday and some trace of blood today as well. Denies any known history of rectal bleed or hemorrhoids. Feels weak and tired.  Review of Systems:  Constitutional: Negative for fever, chills, diaphoresis.  HENT: Negative for headache, congestion, nasal discharge Eyes: Negative for eye pain, blurred vision, double vision and discharge.  Respiratory: Negative for cough, wheezing.  has chronic dyspnea and is on continuous o2 Cardiovascular: Negative for chest pain, palpitations, leg swelling.  Gastrointestinal: Negative for heartburn, nausea, vomiting, abdominal pain Genitourinary: Negative for dysuria, urgency Musculoskeletal: Negative for back pain, falls Skin: Negative for itching, rash.  Neurological: Negative for dizziness, tingling, focal weakness Psychiatric/Behavioral: Negative for depression   Past Medical History  Diagnosis Date  . Allergic rhinitis, cause unspecified    . Unspecified essential hypertension   . Cor pulmonale   . COPD (chronic obstructive pulmonary disease)   . Osteoporosis   . Hashimoto's disease   . Peripheral vascular disease   . Hyperlipidemia   . Depression   . Urinary frequency   . Other atopic dermatitis and related conditions   . Abnormal involuntary movements(781.0)   . Hyposmolality and/or hyponatremia   . Peripheral vascular disease, unspecified   . Lung mass   . Insomnia   . History of radiation therapy 08/08/2013, 08/10/2013, 08/16/2013    SBRT 54 Gy to left upper lobe pulmonary nodule  . Type II or unspecified type diabetes mellitus without mention of complication, not stated as uncontrolled    Past Surgical History  Procedure Laterality Date  . Total abdominal hysterectomy    . Cataracts    . Rotator cuff repair    . Mastectomy Right 1952  . Eye surgery    . Radiation implant, female Left Aug. 2014    Left Breast  3 Tx's  . Hip pinning,cannulated Left 04/06/2015    Procedure: CANNULATED HIP PINNING;  Surgeon: Renette Butters, MD;  Location: [facility];  Service: Orthopedics;  Laterality: Left;   Social History:   reports that she has quit smoking. She has never used smokeless tobacco. She reports that she does not drink alcohol or use illicit drugs.  Family History  Problem Relation Age of Onset  . Cancer Mother     lung   . Lupus Father   . Heart disease Sister  Medications: Patient's Medications  New Prescriptions   No medications on file  Previous Medications   ALBUTEROL (PROVENTIL HFA;VENTOLIN HFA) 108 (90 BASE) MCG/ACT INHALER    Inhale 2 puffs into the lungs every 4 (four) hours as needed for wheezing or shortness of breath.   ALBUTEROL (PROVENTIL) (2.5 MG/3ML) 0.083% NEBULIZER SOLUTION    Take 2.5 mg by nebulization every 6 (six) hours as needed for wheezing or shortness of breath.   ASPIRIN 325 MG TABLET    Take 1 tablet (325 mg total) by mouth daily.   CHOLECALCIFEROL (VITAMIN D-3) 1000 UNITS CAPS     Take 1 capsule by mouth daily.   CITALOPRAM (CELEXA) 20 MG TABLET    Take 20 mg by mouth daily.   CLONAZEPAM (KLONOPIN) 0.5 MG TABLET    Take 0.5-1 tablets (0.25-0.5 mg total) by mouth 2 (two) times daily. 0.25mg  in the am 0.5mg  in the PM   CLOPIDOGREL (PLAVIX) 75 MG TABLET    Take 75 mg by mouth daily with breakfast.   DOCUSATE SODIUM (COLACE) 100 MG CAPSULE    Take 1 capsule (100 mg total) by mouth 2 (two) times daily.   FERROUS SULFATE 325 (65 FE) MG TABLET    Take 325 mg by mouth daily.   FLUTICASONE-SALMETEROL (ADVAIR) 250-50 MCG/DOSE AEPB    Inhale 1 puff into the lungs 2 (two) times daily.   FUROSEMIDE (LASIX) 40 MG TABLET    Take 40 mg by mouth daily.   HYDROCODONE-ACETAMINOPHEN (NORCO) 5-325 MG PER TABLET    Take 1-2 tablets by mouth every 6 (six) hours as needed for moderate pain.   INSULIN ASPART (NOVOLOG) 100 UNIT/ML INJECTION    Inject 5 Units into the skin 3 (three) times daily before meals. Give an additional 5 units if CBG > 150. Notify MD if CBG < 60 or > 40. Hold if BS < 70   INSULIN GLARGINE (LANTUS) 100 UNIT/ML INJECTION    Inject 10 Units into the skin at bedtime.     IPRATROPIUM (ATROVENT) 0.02 % NEBULIZER SOLUTION    Take 0.5 mg by nebulization every 4 (four) hours as needed for wheezing or shortness of breath.   LISINOPRIL (PRINIVIL,ZESTRIL) 5 MG TABLET    Take 2.5 mg by mouth daily. HOLD if systolic blood pressure is less than 786 or diastolic BP is less than 60   LORATADINE (CLARITIN) 10 MG TABLET    Take 10 mg by mouth daily.   ONDANSETRON (ZOFRAN ODT) 4 MG DISINTEGRATING TABLET    Take 1 tablet (4 mg total) by mouth every 8 (eight) hours as needed for nausea.   TIOTROPIUM (SPIRIVA) 18 MCG INHALATION CAPSULE    Place 18 mcg into inhaler and inhale daily.   TRAMADOL (ULTRAM) 50 MG TABLET    Take 100 mg by mouth at bedtime.  Modified Medications   No medications on file  Discontinued Medications   No medications on file     Physical Exam: Filed Vitals:    04/11/15 1436  BP: 102/64  Pulse: 88  Temp: 97 F (36.1 C)  Resp: 18  SpO2: 94%    General- elderly female, thin built, frail, in no acute distress Head- normocephalic, atraumatic Throat- moist mucus membrane Neck- no cervical lymphadenopathy Cardiovascular- normal s1,s2, no murmurs, palpable dorsalis pedis, trace leg edema Respiratory- bilateral poor air entry , no wheeze, no rhonchi, no crackles, no use of accessory muscles Abdomen- bowel sounds present, soft, non tender Musculoskeletal- able to move all 4 extremities,  generalized weakness Neurological- no focal deficit Skin- warm and dry, left hip aquacel dressing Psychiatry- alert and oriented to person, place and time, normal mood and affect    Labs reviewed: Basic Metabolic Panel:  Recent Labs  04/05/15 1133 04/06/15 0452 04/07/15 0600  NA 137 139 137  K 4.0 4.1 4.2  CL 100 102 101  CO2 31 29 25   GLUCOSE 151* 73 139*  BUN 22 20 23   CREATININE 0.96 1.11* 1.12*  CALCIUM 8.7 8.7 8.1*   Liver Function Tests: No results for input(s): AST, ALT, ALKPHOS, BILITOT, PROT, ALBUMIN in the last 8760 hours. No results for input(s): LIPASE, AMYLASE in the last 8760 hours. No results for input(s): AMMONIA in the last 8760 hours. CBC:  Recent Labs  04/05/15 1133 04/06/15 0452 04/07/15 0600  WBC 7.8 9.4 8.3  NEUTROABS 6.0  --   --   HGB 12.5 12.8 11.1*  HCT 40.6 39.1 34.6*  MCV 92.1 88.9 90.3  PLT 272 329 187   Cardiac Enzymes:  Recent Labs  04/05/15 1133  TROPONINI <0.03   BNP: Invalid input(s): POCBNP CBG:  Recent Labs  04/08/15 2139 04/09/15 0635 04/09/15 1142  GLUCAP 129* 104* 107*    Assessment/Plan  Left hip fracture  S/P ORIF. Will have her work with physical therapy and occupational therapy team to help with gait training and muscle strengthening exercises.fall precautions. Skin care. Encourage to be out of bed. Has follow up with Dr. Edmonia Lynch in 1 week. continue aspirin 325 mg daily  30 days for DVT prophylaxis. Continue Norco 5/325 mg 1-2 tabs q6h prn for pain.  Rectal bleed Pilar Plate blood yesterday, minimal today, concern for hemorrhoidal bleed, stool softener and anusol started yesterday appears to be helpful, monitor clinically, vitals are stable, check cbc  Anemia, acute blood loss continue ferrous sulfate 325 mg daily with stool softner and monitor h&h  COPD To be on o2 continuously. Change albuterol and ipratropium prn to duoneb q6h for a week and then q4h prn.  continue Advair 25/50 1 puff bid, Spiriva 18 mcg 2 puffs daily. Monitor clinically  Constipation Continue senna S 2 tabs bid Hypertension  continue lisinopril 2.5 mg daily and Lasix 40 mg daily  Diabetes mellitus, type II  Monitor cbg, continue Lantus 10 units daily and novolog 5 u with meals  Anxiety  continue clonazepam 0.25 mg am and 0.5 mg pm  Depression  continue Celexa 20 mg daily   Goals of care: short term rehabilitation   Labs/tests ordered: cbc, bmp  Family/ staff Communication: reviewed care plan with patient and nursing supervisor    Blanchie Serve, MD  [facility] [phone] (Monday-Friday 8 am - 5 pm) [phone] (afterhours)

## 2015-04-17 ENCOUNTER — Ambulatory Visit (HOSPITAL_COMMUNITY): Payer: Medicare HMO

## 2015-04-19 ENCOUNTER — Ambulatory Visit: Payer: Medicare HMO | Admitting: Family

## 2015-04-19 ENCOUNTER — Encounter (HOSPITAL_COMMUNITY): Payer: Medicare HMO

## 2015-04-26 ENCOUNTER — Ambulatory Visit: Payer: Medicare HMO | Admitting: Radiation Oncology

## 2015-04-26 ENCOUNTER — Ambulatory Visit
Admission: RE | Admit: 2015-04-26 | Discharge: 2015-04-26 | Disposition: A | Discharge status: Home / Self Care | Payer: Medicare HMO | Source: Ambulatory Visit | Attending: Radiation Oncology | Admitting: Radiation Oncology

## 2015-04-26 VITALS — BP 136/82 | HR 98 | Temp 97.8°F | Resp 28 | Wt 118.1 lb

## 2015-04-26 DIAGNOSIS — R918 Other nonspecific abnormal finding of lung field: Secondary | ICD-10-CM

## 2015-04-26 NOTE — Progress Notes (Signed)
Radiation Oncology         [phone] ________________________________  Name: Jennifer Duran MRN: [id number]  Date: 04/26/2015  DOB: Feb 07, 1929  Follow-Up Visit Note  CC: No primary care provider on file.  No ref. provider found  Diagnosis:  81 yo female with PET positive pulmonary nodule presenting in the left upper lobe, clinical stage I   Interval Since Last Radiation:  19  months, the patient completed SBRT  Narrative:  The patient returns today for routine follow-up. Currently in the rehab unit at [facility] assisted living. Denies pain in chest or worsening of breath. Reports breathing improving. Denies hemoptysis. Recent flu and hip fracture. Pt reports feelings of depression, not suicidal.. Pt considering moving to [place] to live closer to family. Nasal cannula in place with 1.5 L oxygen.                             ALLERGIES:  is allergic to celecoxib; codeine; metronidazole; pregabalin; propranolol hcl; rofecoxib; iodine; and pregabalin.  Meds: Current Outpatient Prescriptions  Medication Sig Dispense Refill  . albuterol (PROVENTIL HFA;VENTOLIN HFA) 108 (90 BASE) MCG/ACT inhaler Inhale 2 puffs into the lungs every 4 (four) hours as needed for wheezing or shortness of breath. 1 Inhaler prn  . albuterol (PROVENTIL) (2.5 MG/3ML) 0.083% nebulizer solution Take 2.5 mg by nebulization every 6 (six) hours as needed for wheezing or shortness of breath.    Marland Kitchen aspirin 325 MG tablet Take 1 tablet (325 mg total) by mouth daily. 30 tablet 0  . Cholecalciferol (VITAMIN D-3) 1000 UNITS CAPS Take 1 capsule by mouth daily.    . citalopram (CELEXA) 20 MG tablet Take 20 mg by mouth daily.    . clonazePAM (KLONOPIN) 0.5 MG tablet Take 0.5-1 tablets (0.25-0.5 mg total) by mouth 2 (two) times daily. 0.'25mg'$  in the am 0.'5mg'$  in the PM 30 tablet 0  . clopidogrel (PLAVIX) 75 MG tablet Take 75 mg by mouth daily with breakfast.    . docusate sodium (COLACE) 100 MG capsule Take 1 capsule (100 mg  total) by mouth 2 (two) times daily. 10 capsule 0  . ferrous sulfate 325 (65 FE) MG tablet Take 325 mg by mouth daily.    . Fluticasone-Salmeterol (ADVAIR) 250-50 MCG/DOSE AEPB Inhale 1 puff into the lungs 2 (two) times daily.    . furosemide (LASIX) 40 MG tablet Take 40 mg by mouth daily.    Marland Kitchen HYDROcodone-acetaminophen (NORCO) 5-325 MG per tablet Take 1-2 tablets by mouth every 6 (six) hours as needed for moderate pain. 90 tablet 0  . insulin aspart (NOVOLOG) 100 UNIT/ML injection Inject 5 Units into the skin 3 (three) times daily before meals. Give an additional 5 units if CBG > 150. Notify MD if CBG < 60 or > 40. Hold if BS < 70    . insulin glargine (LANTUS) 100 UNIT/ML injection Inject 10 Units into the skin at bedtime.      Marland Kitchen ipratropium (ATROVENT) 0.02 % nebulizer solution Take 0.5 mg by nebulization every 4 (four) hours as needed for wheezing or shortness of breath.    . lisinopril (PRINIVIL,ZESTRIL) 5 MG tablet Take 2.5 mg by mouth daily. HOLD if systolic blood pressure is less than 431 or diastolic BP is less than 60    . loratadine (CLARITIN) 10 MG tablet Take 10 mg by mouth daily.    . ondansetron (ZOFRAN ODT) 4 MG disintegrating tablet Take 1 tablet (4  mg total) by mouth every 8 (eight) hours as needed for nausea. (Patient not taking: Reported on 03/22/2015) 10 tablet 0  . tiotropium (SPIRIVA) 18 MCG inhalation capsule Place 18 mcg into inhaler and inhale daily.    . traMADol (ULTRAM) 50 MG tablet Take 100 mg by mouth at bedtime.     No current facility-administered medications for this encounter.    Physical Findings: The patient is in no acute distress. Patient is alert and oriented.  weight is 118 lb 1.6 oz (53.57 kg). Her oral temperature is 97.8 F (36.6 C). Her blood pressure is 136/82 and her pulse is 98. Her respiration is 28 and oxygen saturation is 100%. .   Breath sounds the general are distant. The heart has regular rhythm and rate.  no wheezing noted ))  Lungs are clear.  Heart has regular rate and rhythm. No palpable cervical, supraclavicular, or axillary adenopathy. Nasal cannula in place with 1.5 L of oxygen.   Lab Findings: Lab Results  Component Value Date   WBC 8.3 04/07/2015   HGB 11.1* 04/07/2015   HCT 34.6* 04/07/2015   MCV 90.3 04/07/2015   PLT 187 04/07/2015      Radiographic Findings: Ct Chest Wo Contrast  08/08/2014   CLINICAL DATA:  Follow up pulmonary nodule. History of breast cancer status post SBRT 1 year ago. Shortness of breath and cough.  EXAM: CT CHEST WITHOUT CONTRAST  TECHNIQUE: Multidetector CT imaging of the chest was performed following the standard protocol without IV contrast.  COMPARISON:  Chest CTs 12/02/2012, 05/19/2013 and 12/08/2013. PET-CT 06/20/2013.  FINDINGS: Mediastinum: There are no enlarged mediastinal, hilar or axillary lymph nodes. The thyroid gland and esophagus appear normal. There are probable adherent secretions along the left wall of the trachea (image number 5). The heart size is normal. There is diffuse atherosclerosis of the aorta, great vessels and coronary arteries.  Lungs/Pleura: There is no pleural or pericardial effusion.Again demonstrated are severe changes of centrilobular emphysema with architectural distortion and scattered subpleural reticulation. The residual left upper lobe spiculated nodule is less well-defined due to adjacent treatment changes, but grossly stable in size. There is no chest wall extension. The 6 mm right lower lobe nodular density on image number 30 is stable. There is mildly increased linear scarring in the right lung. No new or enlarging pulmonary nodules are identified.  Upper abdomen:  Unremarkable.  There is no adrenal mass.  Musculoskeletal/Chest wall: No chest wall lesion or suspicious osseous finding demonstrated.  IMPRESSION: 1. Little change in appearance of residual spiculated left upper lobe mass status post radiation therapy. 2. No evidence of metastatic disease. 3. Severe  emphysema with scattered pulmonary parenchymal scarring.   Electronically Signed   By: Camie Patience M.D.   On: 08/08/2014 13:00    Impression:  Clinically stable  Plan:  Schedule CT scan in the next week. Follow up in 3 months. Repeat CT scan 6 months after follow up.  This document serves as a record of services personally performed by Gery Pray, MD. It was created on his behalf by Arlyce Harman, a trained medical scribe. The creation of this record is based on the scribe's personal observations and the provider's statements to them. This document has been checked and approved by the attending provider.  ____________________________________ Blair Promise, MD

## 2015-04-26 NOTE — Progress Notes (Signed)
She is currently in no pain.  Pt complains of fatigue, weakness and poor appetite. Shortness of Breath  Walking and Coughing  Productive and Color of Phlegm  white. Pt is on 2L liters/min via Patient connected to nasal cannula oxygen. Pt denies dysphagia. Pt fell and broke her left hip on 04/05/15, she had surgery to repair.  She doing well, doing physical therapy.  She expressed feelings of hopelessness, sadness and depression.  Feeling down almost everyday, with periods of crying.  She is planning on moving this summer to be with her family. BP 136/82 mmHg  Pulse 98  Temp(Src) 97.8 F (36.6 C) (Oral)  Resp 28  Wt 118 lb 1.6 oz (53.57 kg)  SpO2 100%

## 2015-05-01 ENCOUNTER — Ambulatory Visit (INDEPENDENT_AMBULATORY_CARE_PROVIDER_SITE_OTHER): Payer: Medicare HMO | Admitting: Ophthalmology

## 2015-05-01 ENCOUNTER — Non-Acute Institutional Stay (SKILLED_NURSING_FACILITY): Payer: Medicare HMO | Admitting: Adult Health

## 2015-05-01 ENCOUNTER — Encounter: Payer: Self-pay | Admitting: Adult Health

## 2015-05-01 DIAGNOSIS — I10 Essential (primary) hypertension: Secondary | ICD-10-CM

## 2015-05-01 DIAGNOSIS — K5900 Constipation, unspecified: Secondary | ICD-10-CM | POA: Diagnosis not present

## 2015-05-01 DIAGNOSIS — F419 Anxiety disorder, unspecified: Secondary | ICD-10-CM

## 2015-05-01 DIAGNOSIS — F329 Major depressive disorder, single episode, unspecified: Secondary | ICD-10-CM

## 2015-05-01 DIAGNOSIS — J439 Emphysema, unspecified: Secondary | ICD-10-CM

## 2015-05-01 DIAGNOSIS — S72002S Fracture of unspecified part of neck of left femur, sequela: Secondary | ICD-10-CM | POA: Diagnosis not present

## 2015-05-01 DIAGNOSIS — J309 Allergic rhinitis, unspecified: Secondary | ICD-10-CM

## 2015-05-01 DIAGNOSIS — E119 Type 2 diabetes mellitus without complications: Secondary | ICD-10-CM

## 2015-05-01 DIAGNOSIS — F32A Depression, unspecified: Secondary | ICD-10-CM

## 2015-05-01 DIAGNOSIS — D62 Acute posthemorrhagic anemia: Secondary | ICD-10-CM | POA: Diagnosis not present

## 2015-05-01 DIAGNOSIS — R918 Other nonspecific abnormal finding of lung field: Secondary | ICD-10-CM

## 2015-05-01 DIAGNOSIS — I2781 Cor pulmonale (chronic): Secondary | ICD-10-CM | POA: Diagnosis not present

## 2015-05-01 NOTE — Progress Notes (Signed)
Patient ID: Jennifer Duran, female   DOB: 07-23-1929, 81 y.o.   MRN: [id number]     05/01/2015  Facility:  Nursing Home Location:  [place] Room Number: 1202-P LEVEL OF CARE:  SNF (31)    Chief Complaint  Patient presents with  . Discharge Note    Left hip fracture S/P ORIF, anemia, COPD, hypertension, diabetes mellitus, anxiety, allergic rhinitis, cor pulmonale, lung mass, depression and constipation    HISTORY OF PRESENT ILLNESS:  This is an 81 year old female who is for discharge to ALF. She has been admitted to [facility] on 04/09/15 from [facility] who tripped and fell @ the ALF where she lives. She sustained a left hip fracture and had ORIF done on 04/06/15. She has PMH of COPD, cor pulmonale, hypertension, allergic rhinitis and depression.  Patient was admitted to this facility for short-term rehabilitation after the patient's recent hospitalization.  Patient has completed SNF rehabilitation and therapy has cleared the patient for discharge.   PAST MEDICAL HISTORY:  Past Medical History  Diagnosis Date  . Allergic rhinitis, cause unspecified   . Unspecified essential hypertension   . Cor pulmonale   . COPD (chronic obstructive pulmonary disease)   . Osteoporosis   . Hashimoto's disease   . Peripheral vascular disease   . Hyperlipidemia   . Depression   . Urinary frequency   . Other atopic dermatitis and related conditions   . Abnormal involuntary movements(781.0)   . Hyposmolality and/or hyponatremia   . Peripheral vascular disease, unspecified   . Lung mass   . Insomnia   . History of radiation therapy 08/08/2013, 08/10/2013, 08/16/2013    SBRT 54 Gy to left upper lobe pulmonary nodule  . Type II or unspecified type diabetes mellitus without mention of complication, not stated as uncontrolled     CURRENT MEDICATIONS: Reviewed per MAR/see medication list  Allergies  Allergen Reactions  . Celecoxib Swelling  . Codeine  Nausea And Vomiting  . Metronidazole Other (See Comments)    neuropathy  . Pregabalin Swelling  . Propranolol Hcl Other (See Comments)    Unknown  . Rofecoxib Other (See Comments)    Unknown  . Iodine Rash    Only if ingested  . Pregabalin Hives and Rash     REVIEW OF SYSTEMS:  GENERAL: no change in appetite, no fatigue, no weight changes, no fever, chills or weakness RESPIRATORY: no cough, SOB, DOE, wheezing, hemoptysis CARDIAC: no chest pain, edema or palpitations GI: no abdominal pain, diarrhea, heart burn, nausea or vomiting, + constipation  PHYSICAL EXAMINATION  GENERAL: no acute distress, normal body habitus SKIN:  Left hip surgical site is healed, no redness NECK: supple, trachea midline, no neck masses, no thyroid tenderness, no thyromegaly LYMPHATICS: no LAN in the neck, no supraclavicular LAN RESPIRATORY: breathing is even & unlabored, BS CTAB CARDIAC: RRR, no murmur,no extra heart sounds, no edema GI: abdomen soft, normal BS, no masses, no tenderness, no hepatomegaly, no splenomegaly EXTREMITIES:   able to move 4 extremities  PSYCHIATRIC: the patient is alert & oriented to person, affect & behavior appropriate  LABS/RADIOLOGY: Labs reviewed: 04/13/15  WBC 9.0 hemoglobin 10.6 hematocrit 33.2 MCV 88.8 sodium 147 potassium 3.9 glucose 77 BUN 20 creatinine 0.92 total bilirubin 0.4 alkaline phosphatase 63 SGOT 18 SGPT 10 total protein 5.5 albumin 3.1 calcium 8.7 Basic Metabolic Panel:  Recent Labs  04/05/15 1133 04/06/15 0452 04/07/15 0600  NA 137 139 137  K 4.0 4.1  4.2  CL 100 102 101  CO2 '31 29 25  '$ GLUCOSE 151* 73 139*  BUN '22 20 23  '$ CREATININE 0.96 1.11* 1.12*  CALCIUM 8.7 8.7 8.1*   CBC:  Recent Labs  04/05/15 1133 04/06/15 0452 04/07/15 0600  WBC 7.8 9.4 8.3  NEUTROABS 6.0  --   --   HGB 12.5 12.8 11.1*  HCT 40.6 39.1 34.6*  MCV 92.1 88.9 90.3  PLT 272 329 187   Cardiac Enzymes:  Recent Labs  04/05/15 1133  TROPONINI <0.03    CBG:  Recent Labs  04/08/15 2139 04/09/15 0635 04/09/15 1142  GLUCAP 129* 104* 107*     Dg Chest 1 View  04/05/2015   CLINICAL DATA:  Preoperative evaluation, hip fracture, history COPD, Hashimoto's disease, cor pulmonale, lung mass post radiation, diabetes  EXAM: CHEST  1 VIEW  COMPARISON:  03/22/2015  FINDINGS: Normal heart size, mediastinal contours and pulmonary vascularity.  Atherosclerotic calcifications aorta.  Emphysematous and bronchitic changes consistent with COPD.  Scattered interstitial prominence in mid to lower lungs.  Vague LEFT upper lobe density again identified, increased versus previous exam, though slightly more similar to the earlier study of 06/08/2014  No acute infiltrate, pleural effusion or pneumothorax.  Bones demineralized.  IMPRESSION: COPD changes with scattered interstitial disease and a vague persistent area of increased attenuation in the LEFT upper lobe question slightly increased in counts acuity; recommend CT chest to exclude recurrent nodule.   Electronically Signed   By: Lavonia Dana M.D.   On: 04/05/2015 13:16   Ct Head Wo Contrast  04/05/2015   CLINICAL DATA:  Tripped and fall with injury to posterior skull. Headache and neck pain after injury. Initial encounter.  EXAM: CT HEAD WITHOUT CONTRAST  CT CERVICAL SPINE WITHOUT CONTRAST  TECHNIQUE: Multidetector CT imaging of the head and cervical spine was performed following the standard protocol without intravenous contrast. Multiplanar CT image reconstructions of the cervical spine were also generated.  COMPARISON:  None.  FINDINGS: CT HEAD FINDINGS  There is evidence of small vessel disease in the periventricular white matter. The brain demonstrates no evidence of hemorrhage, infarction, edema, mass effect, extra-axial fluid collection, hydrocephalus or mass lesion. The skull is unremarkable and shows no evidence of fracture.  CT CERVICAL SPINE FINDINGS  The cervical spine shows normal alignment. There is no  evidence of acute fracture or subluxation. No soft tissue swelling or hematoma is identified. No bony or soft tissue lesions are seen. Diffuse spondylosis of the cervical spine present at nearly every level. There is an associated mild anterolisthesis of C4 on C5 which is likely degenerative and measures approximately 3 mm. No evidence of acute fracture, traumatic subluxation or soft tissue swelling. The visualized airway is normally patent. Visualized lung apices demonstrate evidence of emphysema. There is incidental calcified plaque noted at both carotid bifurcations.  IMPRESSION: No acute head injury or cervical spine injury. Evidence of small vessel disease in the periventricular white matter. Diffuse spondylosis of the cervical spine present. Incidental carotid atherosclerosis.   Electronically Signed   By: Aletta Edouard M.D.   On: 04/05/2015 12:18   Ct Cervical Spine Wo Contrast  04/05/2015   CLINICAL DATA:  Tripped and fall with injury to posterior skull. Headache and neck pain after injury. Initial encounter.  EXAM: CT HEAD WITHOUT CONTRAST  CT CERVICAL SPINE WITHOUT CONTRAST  TECHNIQUE: Multidetector CT imaging of the head and cervical spine was performed following the standard protocol without intravenous contrast. Multiplanar CT image reconstructions  of the cervical spine were also generated.  COMPARISON:  None.  FINDINGS: CT HEAD FINDINGS  There is evidence of small vessel disease in the periventricular white matter. The brain demonstrates no evidence of hemorrhage, infarction, edema, mass effect, extra-axial fluid collection, hydrocephalus or mass lesion. The skull is unremarkable and shows no evidence of fracture.  CT CERVICAL SPINE FINDINGS  The cervical spine shows normal alignment. There is no evidence of acute fracture or subluxation. No soft tissue swelling or hematoma is identified. No bony or soft tissue lesions are seen. Diffuse spondylosis of the cervical spine present at nearly every  level. There is an associated mild anterolisthesis of C4 on C5 which is likely degenerative and measures approximately 3 mm. No evidence of acute fracture, traumatic subluxation or soft tissue swelling. The visualized airway is normally patent. Visualized lung apices demonstrate evidence of emphysema. There is incidental calcified plaque noted at both carotid bifurcations.  IMPRESSION: No acute head injury or cervical spine injury. Evidence of small vessel disease in the periventricular white matter. Diffuse spondylosis of the cervical spine present. Incidental carotid atherosclerosis.   Electronically Signed   By: Aletta Edouard M.D.   On: 04/05/2015 12:18   Pelvis Portable  04/06/2015   CLINICAL DATA:  Hip fracture  EXAM: PORTABLE PELVIS 1-2 VIEWS  COMPARISON:  None.  FINDINGS: Three cannulated screws transfixing left femoral neck fracture. No other acute fracture or dislocation. Mild osteoarthritis of bilateral hips. No lytic or sclerotic osseous lesion.  IMPRESSION: Three cannulated screws transfixing a left femoral neck fracture.   Electronically Signed   By: Kathreen Devoid   On: 04/06/2015 11:22   Dg Shoulder Left  04/05/2015   CLINICAL DATA:  [place] this morning over shoe box in her living room, put her LEFT arm out to brace the fall, LEFT shoulder pain  EXAM: LEFT SHOULDER - 2+ VIEW  COMPARISON:  None  FINDINGS: Mild osseous demineralization.  AC joint alignment normal.  Glenohumeral joint alignment normal.  No acute fracture, dislocation, or bone destruction.  Visualized LEFT ribs intact.  IMPRESSION: Osseous demineralization without acute abnormalities.   Electronically Signed   By: Lavonia Dana M.D.   On: 04/05/2015 13:10   Dg Hip Operative Unilat With Pelvis Left  04/06/2015   CLINICAL DATA:  Left hip surgery.  EXAM: OPERATIVE left HIP (WITH PELVIS IF PERFORMED) 2 VIEWS  TECHNIQUE: Fluoroscopic spot image(s) were submitted for interpretation post-operatively.  FLUOROSCOPY TIME:  Fluoroscopy Time:  0  min, 24 seconds  Number of Acquired Images:  2  COMPARISON:  None.  FINDINGS: Patient status post open reduction internal fixation of left femoral neck fracture. Good anatomic alignment. Hardware intact.  IMPRESSION: Open reduction internal fixation of left femoral neck fracture with good anatomic alignment.   Electronically Signed   By: Marcello Moores  Register   On: 04/06/2015 08:34   Dg Hip Unilat With Pelvis 2-3 Views Left  04/05/2015   CLINICAL DATA:  Tripped over a shoe box and fell backwards, LEFT hip pain  EXAM: LEFT HIP (WITH PELVIS) 2-3 VIEWS  COMPARISON:  None  FINDINGS: Diffuse osseous demineralization.  Minimal narrowing of the hips joints bilaterally.  SI joints symmetric.  Minimally displaced subcapital fracture LEFT femoral neck.  No dislocation.  Pelvis appears intact.  Scattered atherosclerotic calcifications and few pelvic phleboliths.  Slight prominent stool in rectum.  IMPRESSION: Osseous demineralization with a minimally displaced subcapital fracture LEFT femoral neck.   Electronically Signed   By: Crist Infante.D.  On: 04/05/2015 10:55    ASSESSMENT/PLAN:  Left hip fracture S/P ORIF -  follow-up with Dr. Edmonia Lynch in 1 week; continue aspirin 325 mg by mouth daily 30 days for DVT prophylaxis; Tramadol 50 mg 2 tabs = 100 mg PO Q HS PRNand Norco 5/325 mg 1-2 tabs by mouth every 6 hours when necessary for pain Anemia, acute blood loss - hemoglobin 10.6; continue ferrous sulfate 325 mg by mouth daily COPD - continue Symbicort AER 160-4.5 mcg inhale 2 puffs by mouth twice a day , Spiriva 18 g into lungs 2 puffs daily, albuterol neb every 6 hours when necessary and Atrovent neb every 6 hours when necessary; follow-up with Dr. Brand Males in 1 month Hypertension - well controlled; continue lisinopril 2.5 mg by mouth daily and Lasix 40 mg daily Diabetes mellitus, type II - continue Humalog 5 units 3 times a day and Lantus 10 units subcutaneous daily at bedtime Anxiety - mood is stable;  continue clonazepam 0.25 mg by mouth every morning and 0.5 mg by mouth every afternoon Allergic rhinitis - continue Claritin 10 mg by mouth daily Cor pulmonale - stable; continue Lasix 40 mg by mouth daily and O2 Lung mass - follow-up with Dr. Gery Pray, oncology Depression - continue Celexa 20 mg by mouth daily Constipation - continue  senna S2 tabs by mouth twice a day     I have filled out patient's discharge paperwork and written prescriptions.   Total discharge time: Less than 30 minutes  Discharge time involved coordination of the discharge process with Education officer, museum, nursing staff and therapy department.    [facility], NP [employer] [phone]

## 2015-05-02 ENCOUNTER — Ambulatory Visit (HOSPITAL_COMMUNITY): Payer: Medicare HMO

## 2015-05-03 ENCOUNTER — Ambulatory Visit (HOSPITAL_COMMUNITY): Payer: Medicare HMO

## 2015-05-07 ENCOUNTER — Ambulatory Visit (HOSPITAL_COMMUNITY): Payer: Medicare HMO

## 2015-05-10 ENCOUNTER — Ambulatory Visit (HOSPITAL_COMMUNITY)
Admission: RE | Admit: 2015-05-10 | Discharge: 2015-05-10 | Disposition: A | Discharge status: Home / Self Care | Payer: Medicare HMO | Source: Ambulatory Visit | Attending: Radiation Oncology | Admitting: Radiation Oncology

## 2015-05-10 DIAGNOSIS — I2510 Atherosclerotic heart disease of native coronary artery without angina pectoris: Secondary | ICD-10-CM | POA: Insufficient documentation

## 2015-05-10 DIAGNOSIS — Z85118 Personal history of other malignant neoplasm of bronchus and lung: Secondary | ICD-10-CM | POA: Insufficient documentation

## 2015-05-10 DIAGNOSIS — R918 Other nonspecific abnormal finding of lung field: Secondary | ICD-10-CM

## 2015-05-10 DIAGNOSIS — R911 Solitary pulmonary nodule: Secondary | ICD-10-CM | POA: Diagnosis not present

## 2015-05-15 ENCOUNTER — Inpatient Hospital Stay: Payer: Medicare HMO | Admitting: Internal Medicine

## 2015-05-18 ENCOUNTER — Encounter (HOSPITAL_COMMUNITY): Payer: Self-pay | Admitting: Emergency Medicine

## 2015-05-18 ENCOUNTER — Emergency Department (HOSPITAL_COMMUNITY)
Admission: EM | Admit: 2015-05-18 | Discharge: 2015-05-19 | Disposition: A | Discharge status: Home / Self Care | Payer: Medicare HMO | Attending: Emergency Medicine | Admitting: Emergency Medicine

## 2015-05-18 DIAGNOSIS — I10 Essential (primary) hypertension: Secondary | ICD-10-CM | POA: Diagnosis not present

## 2015-05-18 DIAGNOSIS — Z872 Personal history of diseases of the skin and subcutaneous tissue: Secondary | ICD-10-CM | POA: Insufficient documentation

## 2015-05-18 DIAGNOSIS — J449 Chronic obstructive pulmonary disease, unspecified: Secondary | ICD-10-CM | POA: Insufficient documentation

## 2015-05-18 DIAGNOSIS — E119 Type 2 diabetes mellitus without complications: Secondary | ICD-10-CM | POA: Diagnosis not present

## 2015-05-18 DIAGNOSIS — Z8669 Personal history of other diseases of the nervous system and sense organs: Secondary | ICD-10-CM | POA: Diagnosis not present

## 2015-05-18 DIAGNOSIS — Z7902 Long term (current) use of antithrombotics/antiplatelets: Secondary | ICD-10-CM | POA: Diagnosis not present

## 2015-05-18 DIAGNOSIS — Z7982 Long term (current) use of aspirin: Secondary | ICD-10-CM | POA: Insufficient documentation

## 2015-05-18 DIAGNOSIS — Z794 Long term (current) use of insulin: Secondary | ICD-10-CM | POA: Diagnosis not present

## 2015-05-18 DIAGNOSIS — F329 Major depressive disorder, single episode, unspecified: Secondary | ICD-10-CM | POA: Diagnosis not present

## 2015-05-18 DIAGNOSIS — Z8739 Personal history of other diseases of the musculoskeletal system and connective tissue: Secondary | ICD-10-CM | POA: Diagnosis not present

## 2015-05-18 DIAGNOSIS — Z79899 Other long term (current) drug therapy: Secondary | ICD-10-CM | POA: Diagnosis not present

## 2015-05-18 DIAGNOSIS — N939 Abnormal uterine and vaginal bleeding, unspecified: Secondary | ICD-10-CM

## 2015-05-18 DIAGNOSIS — Z7951 Long term (current) use of inhaled steroids: Secondary | ICD-10-CM | POA: Insufficient documentation

## 2015-05-18 DIAGNOSIS — Z87891 Personal history of nicotine dependence: Secondary | ICD-10-CM | POA: Insufficient documentation

## 2015-05-18 LAB — CBC WITH DIFFERENTIAL/PLATELET
Basophils Absolute: 0 10*3/uL (ref 0.0–0.1)
Basophils Relative: 0 % (ref 0–1)
Eosinophils Absolute: 0.5 10*3/uL (ref 0.0–0.7)
Eosinophils Relative: 6 % — ABNORMAL HIGH (ref 0–5)
HCT: 31.3 % — ABNORMAL LOW (ref 36.0–46.0)
Hemoglobin: 10.1 g/dL — ABNORMAL LOW (ref 12.0–15.0)
LYMPHS ABS: 1.6 10*3/uL (ref 0.7–4.0)
LYMPHS PCT: 20 % (ref 12–46)
MCH: 29.3 pg (ref 26.0–34.0)
MCHC: 32.3 g/dL (ref 30.0–36.0)
MCV: 90.7 fL (ref 78.0–100.0)
Monocytes Absolute: 0.9 10*3/uL (ref 0.1–1.0)
Monocytes Relative: 11 % (ref 3–12)
NEUTROS PCT: 63 % (ref 43–77)
Neutro Abs: 4.9 10*3/uL (ref 1.7–7.7)
PLATELETS: 299 10*3/uL (ref 150–400)
RBC: 3.45 MIL/uL — AB (ref 3.87–5.11)
RDW: 13.4 % (ref 11.5–15.5)
WBC: 7.7 10*3/uL (ref 4.0–10.5)

## 2015-05-18 LAB — I-STAT CHEM 8, ED
BUN: 18 mg/dL (ref 6–20)
CREATININE: 0.9 mg/dL (ref 0.44–1.00)
Calcium, Ion: 1.09 mmol/L — ABNORMAL LOW (ref 1.13–1.30)
Chloride: 97 mmol/L — ABNORMAL LOW (ref 101–111)
GLUCOSE: 126 mg/dL — AB (ref 65–99)
HCT: 32 % — ABNORMAL LOW (ref 36.0–46.0)
HEMOGLOBIN: 10.9 g/dL — AB (ref 12.0–15.0)
Potassium: 4.1 mmol/L (ref 3.5–5.1)
Sodium: 137 mmol/L (ref 135–145)
TCO2: 26 mmol/L (ref 0–100)

## 2015-05-18 NOTE — ED Provider Notes (Signed)
CSN: [id number]     Arrival date & time 05/18/15  2054 History   First MD Initiated Contact with Patient 05/18/15 2145     Chief Complaint  Patient presents with  . Vaginal Bleeding     (Consider location/radiation/quality/duration/timing/severity/associated sxs/prior Treatment) HPI 05 caveat dementia history is obtained from patient from patient's son Edythe Clarity via telephone and from Joan Mayans, med pack at assisted-living facility file a telephone. Complains of vaginal bleeding for approximately 2 weeks. Patient denies any pain no lightheadedness no shortness of breath no other complaint no treatment prior to coming here. Past Medical History  Diagnosis Date  . Allergic rhinitis, cause unspecified   . Unspecified essential hypertension   . Cor pulmonale   . COPD (chronic obstructive pulmonary disease)   . Osteoporosis   . Hashimoto's disease   . Peripheral vascular disease   . Hyperlipidemia   . Depression   . Urinary frequency   . Other atopic dermatitis and related conditions   . Abnormal involuntary movements(781.0)   . Hyposmolality and/or hyponatremia   . Peripheral vascular disease, unspecified   . Lung mass   . Insomnia   . History of radiation therapy 08/08/2013, 08/10/2013, 08/16/2013    SBRT 54 Gy to left upper lobe pulmonary nodule  . Type II or unspecified type diabetes mellitus without mention of complication, not stated as uncontrolled    Past Surgical History  Procedure Laterality Date  . Total abdominal hysterectomy    . Cataracts    . Rotator cuff repair    . Mastectomy Right 1952  . Eye surgery    . Radiation implant, female Left Aug. 2014    Left Breast  3 Tx's  . Hip pinning,cannulated Left 04/06/2015    Procedure: CANNULATED HIP PINNING;  Surgeon: Renette Butters, MD;  Location: [facility];  Service: Orthopedics;  Laterality: Left;   Family History  Problem Relation Age of Onset  . Cancer Mother     lung   . Lupus Father   . Heart disease  Sister    History  Substance Use Topics  . Smoking status: Former Smoker -- 2.00 packs/day for 53 years  . Smokeless tobacco: Never Used  . Alcohol Use: No   OB History    No data available     Review of Systems  Unable to perform ROS Genitourinary: Positive for vaginal bleeding.   unable to perform a complete review of systems, patient demented    Allergies  Celecoxib; Codeine; Metronidazole; Pregabalin; Propranolol hcl; Rofecoxib; Iodine; and Pregabalin  Home Medications   Prior to Admission medications   Medication Sig Start Date End Date Taking? Authorizing Provider  albuterol (PROVENTIL HFA;VENTOLIN HFA) 108 (90 BASE) MCG/ACT inhaler Inhale 2 puffs into the lungs every 4 (four) hours as needed for wheezing or shortness of breath. 11/18/13   Deneise Lever, MD  albuterol (PROVENTIL) (2.5 MG/3ML) 0.083% nebulizer solution Take 2.5 mg by nebulization every 6 (six) hours as needed for wheezing or shortness of breath.    Historical Provider, MD  aspirin 325 MG tablet Take 1 tablet (325 mg total) by mouth daily. 04/06/15   Brittney Claiborne Billings, PA-C  Cholecalciferol (VITAMIN D-3) 1000 UNITS CAPS Take 1 capsule by mouth daily.    Historical Provider, MD  citalopram (CELEXA) 20 MG tablet Take 20 mg by mouth daily.    Historical Provider, MD  clonazePAM (KLONOPIN) 0.5 MG tablet Take 0.5-1 tablets (0.25-0.5 mg total) by mouth 2 (two) times daily. 0.'25mg'$  in  the am 0.'5mg'$  in the PM 04/09/15   Thurnell Lose, MD  clopidogrel (PLAVIX) 75 MG tablet Take 75 mg by mouth daily with breakfast.    Historical Provider, MD  docusate sodium (COLACE) 100 MG capsule Take 1 capsule (100 mg total) by mouth 2 (two) times daily. 04/06/15   Brittney Claiborne Billings, PA-C  ferrous sulfate 325 (65 FE) MG tablet Take 325 mg by mouth daily.    Historical Provider, MD  Fluticasone-Salmeterol (ADVAIR) 250-50 MCG/DOSE AEPB Inhale 1 puff into the lungs 2 (two) times daily.    Historical Provider, MD  furosemide (LASIX) 40 MG  tablet Take 40 mg by mouth daily.    Historical Provider, MD  HYDROcodone-acetaminophen (NORCO) 5-325 MG per tablet Take 1-2 tablets by mouth every 6 (six) hours as needed for moderate pain. 04/09/15   Thurnell Lose, MD  insulin aspart (NOVOLOG) 100 UNIT/ML injection Inject 5 Units into the skin 3 (three) times daily before meals. Give an additional 5 units if CBG > 150. Notify MD if CBG < 60 or > 40. Hold if BS < 70    Historical Provider, MD  insulin glargine (LANTUS) 100 UNIT/ML injection Inject 10 Units into the skin at bedtime.      Historical Provider, MD  ipratropium (ATROVENT) 0.02 % nebulizer solution Take 0.5 mg by nebulization every 4 (four) hours as needed for wheezing or shortness of breath.    Historical Provider, MD  lisinopril (PRINIVIL,ZESTRIL) 5 MG tablet Take 2.5 mg by mouth daily. HOLD if systolic blood pressure is less than 127 or diastolic BP is less than 60    Historical Provider, MD  loratadine (CLARITIN) 10 MG tablet Take 10 mg by mouth daily.    Historical Provider, MD  ondansetron (ZOFRAN ODT) 4 MG disintegrating tablet Take 1 tablet (4 mg total) by mouth every 8 (eight) hours as needed for nausea. Patient not taking: Reported on 03/22/2015 07/15/13   Carmin Muskrat, MD  tiotropium [facility]) 18 MCG inhalation capsule Place 18 mcg into inhaler and inhale daily.    Historical Provider, MD  traMADol (ULTRAM) 50 MG tablet Take 100 mg by mouth at bedtime. 03/15/14   Lauree Chandler, NP   BP 140/58 mmHg  Pulse 89  Temp(Src) 98.4 F (36.9 C) (Oral)  Resp 19  Ht '5\' 3"'$  (1.6 m)  Wt 118 lb (53.524 kg)  BMI 20.91 kg/m2  SpO2 98% Physical Exam  Constitutional: She appears well-developed and well-nourished. No distress.  HENT:  Head: Normocephalic and atraumatic.  Eyes: Conjunctivae are normal. Pupils are equal, round, and reactive to light.  Neck: Neck supple. No tracheal deviation present. No thyromegaly present.  Cardiovascular: Normal rate and regular rhythm.   No  murmur heard. Pulmonary/Chest: Effort normal and breath sounds normal.  Abdominal: Soft. Bowel sounds are normal. She exhibits no distension. There is no tenderness.  Genitourinary:  Dark blood in vagina. No tenderness or mass  Musculoskeletal: Normal range of motion. She exhibits no edema or tenderness.  Neurological: She is alert. Coordination normal.  Skin: Skin is warm and dry. No rash noted.  Psychiatric: She has a normal mood and affect.  Nursing note and vitals reviewed.   ED Course  Procedures (including critical care time) Labs Review Labs Reviewed - No data to display  Imaging Review No results found.   EKG Interpretation None     Ms. Jimmye Norman reported to me that patient has gynecologic follow-up arranged her she does not know when. Results for orders  placed or performed during the hospital encounter of 05/18/15  CBC with Differential/Platelet  Result Value Ref Range   WBC 7.7 4.0 - 10.5 K/uL   RBC 3.45 (L) 3.87 - 5.11 MIL/uL   Hemoglobin 10.1 (L) 12.0 - 15.0 g/dL   HCT 31.3 (L) 36.0 - 46.0 %   MCV 90.7 78.0 - 100.0 fL   MCH 29.3 26.0 - 34.0 pg   MCHC 32.3 30.0 - 36.0 g/dL   RDW 13.4 11.5 - 15.5 %   Platelets 299 150 - 400 K/uL   Neutrophils Relative % 63 43 - 77 %   Neutro Abs 4.9 1.7 - 7.7 K/uL   Lymphocytes Relative 20 12 - 46 %   Lymphs Abs 1.6 0.7 - 4.0 K/uL   Monocytes Relative 11 3 - 12 %   Monocytes Absolute 0.9 0.1 - 1.0 K/uL   Eosinophils Relative 6 (H) 0 - 5 %   Eosinophils Absolute 0.5 0.0 - 0.7 K/uL   Basophils Relative 0 0 - 1 %   Basophils Absolute 0.0 0.0 - 0.1 K/uL  I-stat chem 8, ed  Result Value Ref Range   Sodium 137 135 - 145 mmol/L   Potassium 4.1 3.5 - 5.1 mmol/L   Chloride 97 (L) 101 - 111 mmol/L   BUN 18 6 - 20 mg/dL   Creatinine, Ser 0.90 0.44 - 1.00 mg/dL   Glucose, Bld 126 (H) 65 - 99 mg/dL   Calcium, Ion 1.09 (L) 1.13 - 1.30 mmol/L   TCO2 26 0 - 100 mmol/L   Hemoglobin 10.9 (L) 12.0 - 15.0 g/dL   HCT 32.0 (L) 36.0 - 46.0  %   Ct Chest Wo Contrast  05/10/2015   CLINICAL DATA:  Followup lung nodule.  History of lung cancer.  EXAM: CT CHEST WITHOUT CONTRAST  TECHNIQUE: Multidetector CT imaging of the chest was performed following the standard protocol without IV contrast.  COMPARISON:  08/08/2014  FINDINGS: Mediastinum: The heart size appears normal. Aortic atherosclerosis noted. Calcification involving the left main Coronary artery LAD, left circumflex and RCA noted. The trachea is patent and appears midline. Normal appearance of the esophagus.  Lungs/Pleura: No pleural effusion. Advanced changes of centrilobular and paraseptal emphysema identified. Residual left upper lobe spiculated nodule measures 1.6 x 0.8 cm, image 64 of series 5. This is compared with 1.6 x 0.7 cm previously. There is a new pulmonary nodule within the right lower lobe which measures 6 mm, image 55 of series 4. Right middle lobe nodule is also new measuring 3 mm, image 38/series 4. In the left lung base there is a pulmonary nodule which measures 4 mm, image 58/series 4 4. Also new from previous examination.  Upper Abdomen: The visualized osseous structures are unremarkable. The visualized portions of the gallbladder are normal. The pancreas is unremarkable. Normal appearance of the spleen. The visualized portions of the adrenal glands are normal.  Musculoskeletal: Review of the visualized bony structures is significant for osteopenia. There is no aggressive lytic or sclerotic bone lesion.  IMPRESSION: 1. The index lesion in the left upper lobe is stable in size compared with previous exam. However, there are several new nodules scattered throughout both lungs. The largest is in the right lower lobe measuring 6 mm. Pulmonary metastasis cannot be excluded. 2. Diffuse bronchial wall thickening with emphysema, as above; imaging findings suggestive of underlying COPD. 3. Atherosclerotic disease including left main and 3 vessel coronary artery calcification.    Electronically Signed   By: Lovena Le  Clovis Riley M.D.   On: 05/10/2015 15:54    MDM  I stressed that Dr.Mazzochi Dr/ [facility] should be called Week. Make sure that patient has gynecologic follow-up arranged for within the next week Diagnosis#1 abnormal vaginal bleeding #2 anemia Final diagnoses:  None        Orlie Dakin, MD 05/19/15 0021

## 2015-05-18 NOTE — ED Notes (Signed)
EMS reports patient comes from [facility] assisted living for vaginal bleeding. Spotting noted for the last 4 weeks, and today the patient passed clots "the size of silver dollars". Hx of hysterectomy, DM, dementia, and patient takes plavix. Currently wears o2 support at home at 3L. bp 110/70, p 69, cbg 134. Patient is alert on arrival. Memori, [place] [phone] for concerns.

## 2015-05-19 NOTE — Discharge Instructions (Signed)
Call Dr. Earlie Counts or Dr.Tripp on Monday, 90/90/3014 to make certain that Jennifer Duran has a follow-up with a gynecologist next week.

## 2015-06-10 ENCOUNTER — Inpatient Hospital Stay (HOSPITAL_COMMUNITY)
Admission: EM | Admit: 2015-06-10 | Discharge: 2015-06-12 | DRG: 191 | Disposition: A | Discharge status: Skilled Nursing Facility | Payer: Medicare HMO | Attending: Internal Medicine | Admitting: Internal Medicine

## 2015-06-10 ENCOUNTER — Encounter (HOSPITAL_COMMUNITY): Payer: Self-pay

## 2015-06-10 ENCOUNTER — Emergency Department (HOSPITAL_COMMUNITY): Payer: Medicare HMO

## 2015-06-10 ENCOUNTER — Other Ambulatory Visit (HOSPITAL_COMMUNITY): Payer: Self-pay

## 2015-06-10 DIAGNOSIS — E11649 Type 2 diabetes mellitus with hypoglycemia without coma: Secondary | ICD-10-CM | POA: Diagnosis present

## 2015-06-10 DIAGNOSIS — Z7982 Long term (current) use of aspirin: Secondary | ICD-10-CM

## 2015-06-10 DIAGNOSIS — Z79899 Other long term (current) drug therapy: Secondary | ICD-10-CM | POA: Diagnosis not present

## 2015-06-10 DIAGNOSIS — R0602 Shortness of breath: Secondary | ICD-10-CM | POA: Diagnosis not present

## 2015-06-10 DIAGNOSIS — I2781 Cor pulmonale (chronic): Secondary | ICD-10-CM | POA: Diagnosis present

## 2015-06-10 DIAGNOSIS — I739 Peripheral vascular disease, unspecified: Secondary | ICD-10-CM | POA: Diagnosis present

## 2015-06-10 DIAGNOSIS — R918 Other nonspecific abnormal finding of lung field: Secondary | ICD-10-CM | POA: Diagnosis present

## 2015-06-10 DIAGNOSIS — R531 Weakness: Secondary | ICD-10-CM | POA: Diagnosis not present

## 2015-06-10 DIAGNOSIS — I10 Essential (primary) hypertension: Secondary | ICD-10-CM | POA: Diagnosis present

## 2015-06-10 DIAGNOSIS — Z6821 Body mass index (BMI) 21.0-21.9, adult: Secondary | ICD-10-CM | POA: Diagnosis not present

## 2015-06-10 DIAGNOSIS — F411 Generalized anxiety disorder: Secondary | ICD-10-CM

## 2015-06-10 DIAGNOSIS — C3412 Malignant neoplasm of upper lobe, left bronchus or lung: Secondary | ICD-10-CM | POA: Diagnosis present

## 2015-06-10 DIAGNOSIS — J9611 Chronic respiratory failure with hypoxia: Secondary | ICD-10-CM | POA: Diagnosis present

## 2015-06-10 DIAGNOSIS — Z90710 Acquired absence of both cervix and uterus: Secondary | ICD-10-CM | POA: Diagnosis not present

## 2015-06-10 DIAGNOSIS — Z794 Long term (current) use of insulin: Secondary | ICD-10-CM

## 2015-06-10 DIAGNOSIS — E162 Hypoglycemia, unspecified: Secondary | ICD-10-CM | POA: Diagnosis not present

## 2015-06-10 DIAGNOSIS — Z801 Family history of malignant neoplasm of trachea, bronchus and lung: Secondary | ICD-10-CM

## 2015-06-10 DIAGNOSIS — M810 Age-related osteoporosis without current pathological fracture: Secondary | ICD-10-CM | POA: Diagnosis present

## 2015-06-10 DIAGNOSIS — Z7902 Long term (current) use of antithrombotics/antiplatelets: Secondary | ICD-10-CM | POA: Diagnosis not present

## 2015-06-10 DIAGNOSIS — Z8249 Family history of ischemic heart disease and other diseases of the circulatory system: Secondary | ICD-10-CM

## 2015-06-10 DIAGNOSIS — Z9981 Dependence on supplemental oxygen: Secondary | ICD-10-CM | POA: Diagnosis not present

## 2015-06-10 DIAGNOSIS — E440 Moderate protein-calorie malnutrition: Secondary | ICD-10-CM | POA: Diagnosis present

## 2015-06-10 DIAGNOSIS — N939 Abnormal uterine and vaginal bleeding, unspecified: Secondary | ICD-10-CM | POA: Diagnosis present

## 2015-06-10 DIAGNOSIS — Z66 Do not resuscitate: Secondary | ICD-10-CM | POA: Diagnosis present

## 2015-06-10 DIAGNOSIS — E119 Type 2 diabetes mellitus without complications: Secondary | ICD-10-CM

## 2015-06-10 DIAGNOSIS — J441 Chronic obstructive pulmonary disease with (acute) exacerbation: Principal | ICD-10-CM | POA: Diagnosis present

## 2015-06-10 DIAGNOSIS — E785 Hyperlipidemia, unspecified: Secondary | ICD-10-CM | POA: Diagnosis present

## 2015-06-10 DIAGNOSIS — Z87891 Personal history of nicotine dependence: Secondary | ICD-10-CM | POA: Diagnosis not present

## 2015-06-10 DIAGNOSIS — Z923 Personal history of irradiation: Secondary | ICD-10-CM

## 2015-06-10 LAB — BASIC METABOLIC PANEL
ANION GAP: 11 (ref 5–15)
BUN: 15 mg/dL (ref 6–20)
CHLORIDE: 99 mmol/L — AB (ref 101–111)
CO2: 30 mmol/L (ref 22–32)
Calcium: 9.1 mg/dL (ref 8.9–10.3)
Creatinine, Ser: 0.84 mg/dL (ref 0.44–1.00)
GFR calc Af Amer: >60 mL/min (ref >60)
GFR calc non Af Amer: >60 mL/min (ref >60)
Glucose, Bld: 41 mg/dL — CL (ref 65–99)
Potassium: 3.5 mmol/L (ref 3.5–5.1)
Sodium: 140 mmol/L (ref 135–145)

## 2015-06-10 LAB — CBG MONITORING, ED
GLUCOSE-CAPILLARY: 53 mg/dL — AB (ref 65–99)
GLUCOSE-CAPILLARY: 102 mg/dL — AB (ref 65–99)

## 2015-06-10 LAB — CBC WITH DIFFERENTIAL/PLATELET
BASOS ABS: 0 10*3/uL (ref 0.0–0.1)
BASOS PCT: 0 % (ref 0–1)
EOS ABS: 0.4 10*3/uL (ref 0.0–0.7)
EOS PCT: 3 % (ref 0–5)
HEMATOCRIT: 34.6 % — AB (ref 36.0–46.0)
Hemoglobin: 10.9 g/dL — ABNORMAL LOW (ref 12.0–15.0)
LYMPHS ABS: 1.6 10*3/uL (ref 0.7–4.0)
Lymphocytes Relative: 14 % (ref 12–46)
MCH: 28.5 pg (ref 26.0–34.0)
MCHC: 31.5 g/dL (ref 30.0–36.0)
MCV: 90.6 fL (ref 78.0–100.0)
MONOS PCT: 9 % (ref 3–12)
Monocytes Absolute: 1 10*3/uL (ref 0.1–1.0)
Neutro Abs: 8.3 10*3/uL — ABNORMAL HIGH (ref 1.7–7.7)
Neutrophils Relative %: 74 % (ref 43–77)
PLATELETS: 439 10*3/uL — AB (ref 150–400)
RBC: 3.82 MIL/uL — ABNORMAL LOW (ref 3.87–5.11)
RDW: 13.2 % (ref 11.5–15.5)
WBC: 11.3 10*3/uL — AB (ref 4.0–10.5)

## 2015-06-10 LAB — GLUCOSE, CAPILLARY
GLUCOSE-CAPILLARY: 388 mg/dL — AB (ref 65–99)
Glucose-Capillary: 373 mg/dL — ABNORMAL HIGH (ref 65–99)

## 2015-06-10 LAB — I-STAT TROPONIN, ED: Troponin i, poc: 0.01 ng/mL (ref 0.00–0.08)

## 2015-06-10 MED ORDER — INSULIN ASPART 100 UNIT/ML ~~LOC~~ SOLN
5.0000 [IU] | Freq: Once | SUBCUTANEOUS | Status: AC
  Administered 2015-06-10: 5 [IU] via SUBCUTANEOUS

## 2015-06-10 MED ORDER — POTASSIUM CHLORIDE CRYS ER 20 MEQ PO TBCR
40.0000 meq | EXTENDED_RELEASE_TABLET | Freq: Once | ORAL | Status: AC
  Administered 2015-06-10: 40 meq via ORAL
  Filled 2015-06-10: qty 2

## 2015-06-10 MED ORDER — IPRATROPIUM BROMIDE 0.02 % IN SOLN
0.5000 mg | Freq: Once | RESPIRATORY_TRACT | Status: AC
  Administered 2015-06-10: 0.5 mg via RESPIRATORY_TRACT
  Filled 2015-06-10: qty 2.5

## 2015-06-10 MED ORDER — CLONAZEPAM 0.5 MG PO TABS: 0.2500 mg | ORAL_TABLET | Freq: Two times a day (BID) | ORAL | Status: DC

## 2015-06-10 MED ORDER — ALBUTEROL SULFATE (2.5 MG/3ML) 0.083% IN NEBU
5.0000 mg | INHALATION_SOLUTION | Freq: Once | RESPIRATORY_TRACT | Status: AC
  Administered 2015-06-10: 5 mg via RESPIRATORY_TRACT
  Filled 2015-06-10: qty 6

## 2015-06-10 MED ORDER — SODIUM CHLORIDE 0.9 % IV SOLN
250.0000 mL | INTRAVENOUS | Status: DC | PRN
  Administered 2015-06-10: 250 mL via INTRAVENOUS

## 2015-06-10 MED ORDER — CLONAZEPAM 0.5 MG PO TABS
0.5000 mg | ORAL_TABLET | Freq: Every day | ORAL | Status: DC
  Administered 2015-06-11: 0.5 mg via ORAL
  Filled 2015-06-10: qty 1

## 2015-06-10 MED ORDER — CITALOPRAM HYDROBROMIDE 20 MG PO TABS
20.0000 mg | ORAL_TABLET | Freq: Every day | ORAL | Status: DC
  Administered 2015-06-11 – 2015-06-12 (×2): 20 mg via ORAL
  Filled 2015-06-10 (×2): qty 1

## 2015-06-10 MED ORDER — VITAMIN D3 25 MCG (1000 UNIT) PO TABS
1000.0000 [IU] | ORAL_TABLET | Freq: Every day | ORAL | Status: DC
  Administered 2015-06-11 – 2015-06-12 (×2): 1000 [IU] via ORAL
  Filled 2015-06-10 (×2): qty 1

## 2015-06-10 MED ORDER — INSULIN ASPART 100 UNIT/ML ~~LOC~~ SOLN: 0.0000 [IU] | Freq: Every day | SUBCUTANEOUS | Status: DC

## 2015-06-10 MED ORDER — TRAMADOL HCL 50 MG PO TABS: 100.0000 mg | ORAL_TABLET | Freq: Every evening | ORAL | Status: DC | PRN

## 2015-06-10 MED ORDER — ENOXAPARIN SODIUM 40 MG/0.4ML ~~LOC~~ SOLN
40.0000 mg | SUBCUTANEOUS | Status: DC
  Filled 2015-06-10 (×2): qty 0.4

## 2015-06-10 MED ORDER — PROMETHAZINE HCL 25 MG PO TABS
12.5000 mg | ORAL_TABLET | Freq: Four times a day (QID) | ORAL | Status: DC | PRN
  Administered 2015-06-10: 12.5 mg via ORAL
  Filled 2015-06-10: qty 1

## 2015-06-10 MED ORDER — IPRATROPIUM-ALBUTEROL 0.5-2.5 (3) MG/3ML IN SOLN
3.0000 mL | RESPIRATORY_TRACT | Status: DC
  Administered 2015-06-10: 3 mL via RESPIRATORY_TRACT
  Filled 2015-06-10: qty 3

## 2015-06-10 MED ORDER — SODIUM CHLORIDE 0.9 % IJ SOLN
3.0000 mL | Freq: Two times a day (BID) | INTRAMUSCULAR | Status: DC
  Administered 2015-06-10: 3 mL via INTRAVENOUS

## 2015-06-10 MED ORDER — BENZONATATE 100 MG PO CAPS: 100.0000 mg | ORAL_CAPSULE | Freq: Three times a day (TID) | ORAL | Status: DC | PRN

## 2015-06-10 MED ORDER — INSULIN ASPART 100 UNIT/ML ~~LOC~~ SOLN
0.0000 [IU] | Freq: Three times a day (TID) | SUBCUTANEOUS | Status: DC
  Administered 2015-06-11 – 2015-06-12 (×4): 3 [IU] via SUBCUTANEOUS

## 2015-06-10 MED ORDER — SODIUM CHLORIDE 0.9 % IJ SOLN: 3.0000 mL | INTRAMUSCULAR | Status: DC | PRN

## 2015-06-10 MED ORDER — ALBUTEROL SULFATE (2.5 MG/3ML) 0.083% IN NEBU: 2.5000 mg | INHALATION_SOLUTION | RESPIRATORY_TRACT | Status: DC | PRN

## 2015-06-10 MED ORDER — INSULIN ASPART 100 UNIT/ML ~~LOC~~ SOLN: 0.0000 [IU] | Freq: Three times a day (TID) | SUBCUTANEOUS | Status: DC

## 2015-06-10 MED ORDER — METHYLPREDNISOLONE SODIUM SUCC 40 MG IJ SOLR
40.0000 mg | Freq: Two times a day (BID) | INTRAMUSCULAR | Status: DC
  Administered 2015-06-10 – 2015-06-11 (×2): 40 mg via INTRAVENOUS
  Filled 2015-06-10 (×4): qty 1

## 2015-06-10 MED ORDER — DEXTROSE 50 % IV SOLN
INTRAVENOUS | Status: AC
  Filled 2015-06-10: qty 50

## 2015-06-10 MED ORDER — FERROUS SULFATE 325 (65 FE) MG PO TABS
325.0000 mg | ORAL_TABLET | Freq: Every day | ORAL | Status: DC
  Administered 2015-06-11 – 2015-06-12 (×2): 325 mg via ORAL
  Filled 2015-06-10 (×2): qty 1

## 2015-06-10 MED ORDER — MAGNESIUM SULFATE 2 GM/50ML IV SOLN
2.0000 g | Freq: Once | INTRAVENOUS | Status: AC
  Administered 2015-06-10: 2 g via INTRAVENOUS
  Filled 2015-06-10: qty 50

## 2015-06-10 MED ORDER — LISINOPRIL 2.5 MG PO TABS
2.5000 mg | ORAL_TABLET | Freq: Every day | ORAL | Status: DC
  Administered 2015-06-11 – 2015-06-12 (×2): 2.5 mg via ORAL
  Filled 2015-06-10 (×2): qty 1

## 2015-06-10 MED ORDER — GUAIFENESIN ER 600 MG PO TB12
600.0000 mg | ORAL_TABLET | Freq: Two times a day (BID) | ORAL | Status: DC
  Administered 2015-06-11 – 2015-06-12 (×3): 600 mg via ORAL
  Filled 2015-06-10 (×4): qty 1

## 2015-06-10 MED ORDER — CETYLPYRIDINIUM CHLORIDE 0.05 % MT LIQD
7.0000 mL | Freq: Two times a day (BID) | OROMUCOSAL | Status: DC
  Administered 2015-06-10 – 2015-06-12 (×4): 7 mL via OROMUCOSAL

## 2015-06-10 MED ORDER — GUAIFENESIN-DM 100-10 MG/5ML PO SYRP
5.0000 mL | ORAL_SOLUTION | ORAL | Status: DC | PRN
Start: 2015-06-10 — End: 2015-06-12

## 2015-06-10 MED ORDER — ACETAMINOPHEN 325 MG PO TABS
650.0000 mg | ORAL_TABLET | Freq: Four times a day (QID) | ORAL | Status: DC | PRN
  Administered 2015-06-11: 650 mg via ORAL
  Filled 2015-06-10: qty 2

## 2015-06-10 MED ORDER — ENSURE ENLIVE PO LIQD
237.0000 mL | Freq: Two times a day (BID) | ORAL | Status: DC
  Administered 2015-06-11 – 2015-06-12 (×3): 237 mL via ORAL

## 2015-06-10 MED ORDER — CLOPIDOGREL BISULFATE 75 MG PO TABS
75.0000 mg | ORAL_TABLET | Freq: Every day | ORAL | Status: DC
  Administered 2015-06-11: 75 mg via ORAL
  Filled 2015-06-10 (×2): qty 1

## 2015-06-10 MED ORDER — FUROSEMIDE 40 MG PO TABS
40.0000 mg | ORAL_TABLET | Freq: Every day | ORAL | Status: DC
  Administered 2015-06-11 – 2015-06-12 (×2): 40 mg via ORAL
  Filled 2015-06-10 (×2): qty 1

## 2015-06-10 MED ORDER — ACETAMINOPHEN 650 MG RE SUPP: 650.0000 mg | Freq: Four times a day (QID) | RECTAL | Status: DC | PRN

## 2015-06-10 MED ORDER — CLONAZEPAM 0.5 MG PO TABS
0.2500 mg | ORAL_TABLET | Freq: Every day | ORAL | Status: DC
  Administered 2015-06-11 – 2015-06-12 (×2): 0.25 mg via ORAL
  Filled 2015-06-10 (×2): qty 1

## 2015-06-10 MED ORDER — DEXTROSE 50 % IV SOLN
1.0000 | Freq: Once | INTRAVENOUS | Status: AC
  Administered 2015-06-10: 50 mL via INTRAVENOUS
  Filled 2015-06-10: qty 50

## 2015-06-10 MED ORDER — DEXTROSE 50 % IV SOLN
1.0000 | Freq: Once | INTRAVENOUS | Status: AC
  Administered 2015-06-10: 50 mL via INTRAVENOUS

## 2015-06-10 MED ORDER — BUDESONIDE 0.25 MG/2ML IN SUSP
0.2500 mg | Freq: Two times a day (BID) | RESPIRATORY_TRACT | Status: DC
  Administered 2015-06-10 – 2015-06-11 (×3): 0.25 mg via RESPIRATORY_TRACT
  Filled 2015-06-10 (×4): qty 2

## 2015-06-10 MED ORDER — IPRATROPIUM-ALBUTEROL 0.5-2.5 (3) MG/3ML IN SOLN
3.0000 mL | Freq: Three times a day (TID) | RESPIRATORY_TRACT | Status: DC
  Administered 2015-06-11 (×3): 3 mL via RESPIRATORY_TRACT
  Filled 2015-06-10 (×4): qty 3

## 2015-06-10 NOTE — H&P (Signed)
PATIENT DETAILS Name: Jennifer Duran Age: 81 y.o. Sex: female Date of Birth: Apr 28, 1929 Admit Date: 06/10/2015 PCP:No primary care provider on file. Referring Physician:Dr Zavitz   CHIEF COMPLAINT:   shortness of breath since yesterday  HPI: Jennifer Duran is a 81 y.o. female with a Past Medical History of COPD on home O2, chronic respiratory failure,  Type 2 diabetes on insulin, Hypertension, cor pulmonale who presents today with the above noted complaint. Per patient, for the past 2 -3 days she has been feeling weaker than usual, yesterday she started slowly having worsening of her dry cough and then started getting shortness of breath. Over the course of the day today, shortness of breath gradually worsened, she had very little relief with nebulized bronchodilators. Patient currently lives in a assisted living facility, and was subsequently sent to the emergency room for further evaluation and treatment. In the ED , she received nebulizer treatment and felt somewhat better, however she was found to be hypoglycemic. The hospitalist service was then asked to admit this patient for further evaluation and treatment  Patient does endorse some nausea but no vomiting. She does not have a great appetite.  She denies any chest pain or fever. She denies any abdominal pain or diarrhea.   ALLERGIES:   Allergies  Allergen Reactions  . Celecoxib Swelling  . Codeine Nausea And Vomiting  . Metronidazole Other (See Comments)    neuropathy  . Other     Other reaction(s): Unknown Uncoded Allergy. Allergen: fosomax  . Pregabalin Swelling    Hives and rash  . Propranolol Hcl Other (See Comments)    Unknown  . Rofecoxib Other (See Comments)    Unknown  . Shellfish Allergy     Other reaction(s): Unknown  . Iodine Rash    Only if ingested    PAST MEDICAL HISTORY: Past Medical History  Diagnosis Date  . Allergic rhinitis, cause unspecified   . Unspecified essential  hypertension   . Cor pulmonale   . COPD (chronic obstructive pulmonary disease)   . Osteoporosis   . Hashimoto's disease   . Peripheral vascular disease   . Hyperlipidemia   . Depression   . Urinary frequency   . Other atopic dermatitis and related conditions   . Abnormal involuntary movements(781.0)   . Hyposmolality and/or hyponatremia   . Peripheral vascular disease, unspecified   . Lung mass   . Insomnia   . History of radiation therapy 08/08/2013, 08/10/2013, 08/16/2013    SBRT 54 Gy to left upper lobe pulmonary nodule  . Type II or unspecified type diabetes mellitus without mention of complication, not stated as uncontrolled     PAST SURGICAL HISTORY: Past Surgical History  Procedure Laterality Date  . Total abdominal hysterectomy    . Cataracts    . Rotator cuff repair    . Mastectomy Right 1952  . Eye surgery    . Radiation implant, female Left Aug. 2014    Left Breast  3 Tx's  . Hip pinning,cannulated Left 04/06/2015    Procedure: CANNULATED HIP PINNING;  Surgeon: Renette Butters, MD;  Location: [place];  Service: Orthopedics;  Laterality: Left;    MEDICATIONS AT HOME: Prior to Admission medications   Medication Sig Start Date End Date Taking? Authorizing Provider  albuterol (PROVENTIL HFA;VENTOLIN HFA) 108 (90 BASE) MCG/ACT inhaler Inhale 2 puffs into the lungs every 4 (four) hours as needed for wheezing or shortness of breath. 11/18/13  Yes Deneise Lever, MD  albuterol (PROVENTIL) (2.5 MG/3ML) 0.083% nebulizer solution Take 2.5 mg by nebulization every 6 (six) hours as needed for wheezing or shortness of breath.   Yes Historical Provider, MD  cholecalciferol (VITAMIN D) 1000 UNITS tablet Take 1,000 Units by mouth daily.   Yes Historical Provider, MD  citalopram (CELEXA) 20 MG tablet Take 20 mg by mouth daily.   Yes Historical Provider, MD  clonazePAM (KLONOPIN) 0.5 MG tablet Take 0.5-1 tablets (0.25-0.5 mg total) by mouth 2 (two) times daily. 0.'25mg'$  in the am 0.'5mg'$  in  the PM 04/09/15  Yes Thurnell Lose, MD  clopidogrel (PLAVIX) 75 MG tablet Take 75 mg by mouth daily with breakfast.   Yes Historical Provider, MD  ferrous sulfate 325 (65 FE) MG tablet Take 325 mg by mouth daily.   Yes Historical Provider, MD  Fluticasone-Salmeterol (ADVAIR) 250-50 MCG/DOSE AEPB Inhale 1 puff into the lungs 2 (two) times daily.   Yes Historical Provider, MD  furosemide (LASIX) 40 MG tablet Take 40 mg by mouth daily.   Yes Historical Provider, MD  insulin aspart (NOVOLOG) 100 UNIT/ML injection Inject 5 Units into the skin 3 (three) times daily before meals. Give an additional 5 units if CBG > 150. Notify MD if CBG < 60 or > 400. Hold if BS < 70   Yes Historical Provider, MD  insulin glargine (LANTUS) 100 UNIT/ML injection Inject 10 Units into the skin at bedtime.     Yes Historical Provider, MD  ipratropium (ATROVENT) 0.02 % nebulizer solution Take 0.5 mg by nebulization every 4 (four) hours as needed for wheezing or shortness of breath.   Yes Historical Provider, MD  lisinopril (PRINIVIL,ZESTRIL) 2.5 MG tablet Take 2.5 mg by mouth daily. Hold if Systolic BP is less than 811 or Diastotic BP is less than 60   Yes Historical Provider, MD  OVER THE COUNTER MEDICATION Clorseptic cherry spray. Spray 2 sprays into throat every 4 hours as needed for 48 hours   Yes Historical Provider, MD  [place] 2. Provide and drink by mouth twice daily due to low protein   Yes Historical Provider, MD  prochlorperazine (COMPAZINE) 10 MG tablet Take 10 mg by mouth every 6 (six) hours as needed for nausea or vomiting.   Yes Historical Provider, MD  tiotropium (SPIRIVA) 18 MCG inhalation capsule Place 18 mcg into inhaler and inhale daily.   Yes Historical Provider, MD  traMADol (ULTRAM) 50 MG tablet Take 100 mg by mouth at bedtime as needed.   Yes Historical Provider, MD  aspirin 325 MG tablet Take 1 tablet (325 mg total) by mouth daily. Patient not taking: Reported on 05/18/2015  04/06/15   Lovett Calender, PA-C  docusate sodium (COLACE) 100 MG capsule Take 1 capsule (100 mg total) by mouth 2 (two) times daily. Patient not taking: Reported on 05/18/2015 04/06/15   Lovett Calender, PA-C  HYDROcodone-acetaminophen (NORCO) 5-325 MG per tablet Take 1-2 tablets by mouth every 6 (six) hours as needed for moderate pain. Patient not taking: Reported on 05/18/2015 04/09/15   Thurnell Lose, MD  ondansetron (ZOFRAN ODT) 4 MG disintegrating tablet Take 1 tablet (4 mg total) by mouth every 8 (eight) hours as needed for nausea. Patient not taking: Reported on 03/22/2015 07/15/13   Carmin Muskrat, MD    FAMILY HISTORY: Family History  Problem Relation Age of Onset  . Cancer Mother     lung   . Lupus Father   . Heart disease  Sister     SOCIAL HISTORY:  reports that she has quit smoking. She has never used smokeless tobacco. She reports that she does not drink alcohol or use illicit drugs. Lives at: ALF Mobility:  Independent- but does use a walker occasionally  REVIEW OF SYSTEMS:  Constitutional:   No  weight loss, night sweats,  Fevers, chills, fatigue.  HEENT:    No headaches, Dysphagia,Tooth/dental problems,Sore throat,  No sneezing, itching, ear ache, nasal congestion, post nasal drip  Cardio-vascular: No chest pain,Orthopnea, PND,lower extremity edema, anasarca, palpitations  GI:  No heartburn, indigestion, abdominal pain, vomiting, diarrhea, melena or hematochezia  Resp: No hemoptysis,plueritic chest pain.   Skin:  No rash or lesions.  GU:  No dysuria, change in color of urine, no urgency or frequency.  No flank pain.  Musculoskeletal: No joint pain or swelling.  No decreased range of motion.  No back pain.  Endocrine: No heat intolerance, no cold intolerance, no polyuria, no polydipsia  Psych: No change in mood or affect. No depression or anxiety.  No memory loss.   PHYSICAL EXAM: Blood pressure 176/42, pulse 117, temperature 97.9 F (36.6 C),  temperature source Oral, resp. rate 18, SpO2 100 %.  General appearance :Awake, alert, not in any distress. Speech Clear. Not toxic Looking HEENT: Atraumatic and Normocephalic, pupils equally reactive to light and accomodation Neck: supple, no JVD. No cervical lymphadenopathy.  Chest: decreased air entry at bases  Bilaterally with some scattered rhonchi. CVS: S1 S2 regular, no murmurs.  Abdomen: Bowel sounds present, Non tender and not distended with no gaurding, rigidity or rebound. Extremities: B/L Lower Ext shows no edema, both legs are warm to touch Neurology:  Non focal Skin:No Rash Wounds:N/A  LABS ON ADMISSION:   Recent Labs  06/10/15 1530  NA 140  K 3.5  CL 99*  CO2 30  GLUCOSE 41*  BUN 15  CREATININE 0.84  CALCIUM 9.1   No results for input(s): AST, ALT, ALKPHOS, BILITOT, PROT, ALBUMIN in the last 72 hours. No results for input(s): LIPASE, AMYLASE in the last 72 hours.  Recent Labs  06/10/15 1530  WBC 11.3*  NEUTROABS 8.3*  HGB 10.9*  HCT 34.6*  MCV 90.6  PLT 439*   No results for input(s): CKTOTAL, CKMB, CKMBINDEX, TROPONINI in the last 72 hours. No results for input(s): DDIMER in the last 72 hours. Invalid input(s): POCBNP   RADIOLOGIC STUDIES ON ADMISSION: Dg Chest 2 View  06/10/2015   CLINICAL DATA:  Increased shortness of breath and dry cough for several days. History of COPD and lung cancer. Oxygen dependent. Initial encounter.  EXAM: CHEST  2 VIEW  COMPARISON:  CT 05/10/2015.  Radiographs 04/05/2015.  FINDINGS: The heart size and mediastinal contours are stable. There is aortic atherosclerosis. The lungs are hyperinflated with diffusely increased interstitial markings and scattered scarring as before. Asymmetric spiculated density in the left upper lobe appears unchanged, related to treated bronchogenic carcinoma. No new nodules identified radiographically. There is no confluent airspace opacity or pleural effusion. The bones appear unchanged.   IMPRESSION: Stable radiographic appearance of the chest with severe emphysema and residual left upper lobe nodule reportedly from treated lung cancer. No acute superimposed findings.   Electronically Signed   By: Richardean Sale M.D.   On: 06/10/2015 13:52    I have personally reviewed images of chest xray    EKG: Personally reviewed.  Sinus rhythm  ASSESSMENT AND PLAN: Present on Admission:  . Hypoglycemia : Suspect secondary to poor oral  intake, and continued use of scheduled pre meal NovoLog and Lantus.Check A1C. We will hold off on starting scheduled NovoLog/Lantus-displaced on SSI. Will be on steroids for COPD exacerbation. Will follow and a chest her medications accordingly.  Marland Kitchen COPD exacerbation: much better following nebulized bronchodilators treatment here in the emergency room. However has oxygen dependent respiratory failure and severe COPD, hence will start scheduled nebulized bronchodilators and empirically start on IV Solu-Medrol. No fever , chest x-ray does not show pneumonia-monitor off antibiotics for now.  . Essential hypertension: Continue with Lasix and Lisinopril  . Generalized anxiety disorder : continue with clonazepam.  . Lung mass : has a known lung mass-reviewed prior notes-because of her severe COPD this was never biopsied- she has undergone radiation treatment for this. She will follow-up with terminology and radiation oncology at her next scheduled appointment. I see no indication for ordering any radiological studies at this time  .  Chronic hypoxic respiratory failure: continue home O2  .  Generalized weakness: PT evaluation  Further plan will depend as patient's clinical course evolves and further radiologic and laboratory data become available. Patient will be monitored closely.  Above noted plan was discussed with patient face to face at bedside, she was in agreement.   CONSULTS: None  DVT Prophylaxis: Prophylactic Lovenox  Code  Status: DNR-confirmed with patient-"No heroics" "Took my husband off the ventilator"  Disposition Plan:  Discharge back to ALF possibly in 2 days,but may warrant SNF depending on clinical course  Total time spent  55 minutes.Greater than 50% of this time was spent in counseling, explanation of diagnosis, planning of further management, and coordination of care.  [facility] Pager [phone]  If 7PM-7AM, please contact night-coverage [email or web address] Password [facility] 06/10/2015, 6:03 PM    Dr. Reather Converse

## 2015-06-10 NOTE — ED Notes (Addendum)
Pt reported [facility] with exertion. Auscultated diminish breath sounds to RLL. Occ nonproductive cough. Resp even and slightly labored. Denies chest pain. Pt lives at [facility] ([place]).

## 2015-06-10 NOTE — ED Notes (Signed)
Awake. Verbally responsive. A/O x4. Resp even and unlabored. No audible adventitious breath sounds noted. ABC's intact. SR on monitor. IV in RUA infiltrated causing swelling after given Dextrose. Applied warm compressor. Dr. Marlon Pel aware.

## 2015-06-10 NOTE — ED Notes (Signed)
Awake. Verbally responsive. A/O x4. Resp even and unlabored. No audible adventitious breath sounds noted. ABC's intact. SR on monitor.

## 2015-06-10 NOTE — Progress Notes (Signed)
Pt arrived from ED at [place] with a right arm iv infiltration of D50 per [facility] ED nurse, with heat pack on. I assessed pt at [place] and removed heat pack and applied ice pack.  Reassessed site at 9 pm and still very swollen, radial pulse is good, fingers blanch.  CBG check was 388, rechecked for 374.  Kathline Magic notified of CBG, and infiltration site.  Pharmacist notified who states no treatment for D50 except ice pack.  Rapid Response nurse consulted, states to just check radial pulse frequently.  Pt then had bright red blood in the bedpan after stool.  Pt states it is her hemorrhoids. She also states she has blood in urine x 2 weeks.  VS rechecked, stable, her hgb was at baseline in ED.  Will obtain a specimen when pt urinates.  Held lovenox, pt already took plavix and asa at home.  Will call NP when get urine specimen and ask for ua, and to add bleeding time test to am labs.  Will continue to monitor. Fara Olden P

## 2015-06-10 NOTE — ED Notes (Addendum)
Per EMS pt from [facility] c/o cough x several days, nasal congestion, nausea, and body weakness onset today.

## 2015-06-10 NOTE — ED Notes (Signed)
Pt given meal tray.

## 2015-06-10 NOTE — ED Notes (Signed)
Awake. Verbally responsive. A/O x4. Resp even and unlabored. No audible adventitious breath sounds noted. ABC's intact. SR on monitor. IV saline lock patent and intact.

## 2015-06-10 NOTE — ED Provider Notes (Signed)
CSN: [id number]     Arrival date & time 06/10/15  1318 History   First MD Initiated Contact with Patient 06/10/15 1457     Chief Complaint  Patient presents with  . Cough     (Consider location/radiation/quality/duration/timing/severity/associated sxs/prior Treatment) HPI Comments: Patient with past medical history of COPD and lung metastases presents to the emergency department with chief complaint of cough and shortness of breath with exertion 2 days. She states that she has had a mildly productive cough with some associated nausea. She denies any associated fevers, chills, chest pain, vomiting, diarrhea, constipation.  She also complains that she has had worsening shortness of breath with exertion for the past 2 days. She states that this is new for her. She wears oxygen (2 L) at home during the day and (3 L) at night. She stays at [facility].   The history is provided by the patient. No language interpreter was used.    Past Medical History  Diagnosis Date  . Allergic rhinitis, cause unspecified   . Unspecified essential hypertension   . Cor pulmonale   . COPD (chronic obstructive pulmonary disease)   . Osteoporosis   . Hashimoto's disease   . Peripheral vascular disease   . Hyperlipidemia   . Depression   . Urinary frequency   . Other atopic dermatitis and related conditions   . Abnormal involuntary movements(781.0)   . Hyposmolality and/or hyponatremia   . Peripheral vascular disease, unspecified   . Lung mass   . Insomnia   . History of radiation therapy 08/08/2013, 08/10/2013, 08/16/2013    SBRT 54 Gy to left upper lobe pulmonary nodule  . Type II or unspecified type diabetes mellitus without mention of complication, not stated as uncontrolled    Past Surgical History  Procedure Laterality Date  . Total abdominal hysterectomy    . Cataracts    . Rotator cuff repair    . Mastectomy Right 1952  . Eye surgery    . Radiation implant, female Left Aug.  2014    Left Breast  3 Tx's  . Hip pinning,cannulated Left 04/06/2015    Procedure: CANNULATED HIP PINNING;  Surgeon: Renette Butters, MD;  Location: [facility];  Service: Orthopedics;  Laterality: Left;   Family History  Problem Relation Age of Onset  . Cancer Mother     lung   . Lupus Father   . Heart disease Sister    History  Substance Use Topics  . Smoking status: Former Smoker -- 2.00 packs/day for 53 years  . Smokeless tobacco: Never Used  . Alcohol Use: No   OB History    No data available     Review of Systems  Constitutional: Negative for fever and chills.  Respiratory: Positive for cough and shortness of breath.   Cardiovascular: Negative for chest pain.  Gastrointestinal: Negative for nausea, vomiting, diarrhea and constipation.  Genitourinary: Negative for dysuria.  All other systems reviewed and are negative.     Allergies  Celecoxib; Codeine; Metronidazole; Other; Pregabalin; Propranolol hcl; Rofecoxib; Shellfish allergy; and Iodine  Home Medications   Prior to Admission medications   Medication Sig Start Date End Date Taking? Authorizing Provider  albuterol (PROVENTIL HFA;VENTOLIN HFA) 108 (90 BASE) MCG/ACT inhaler Inhale 2 puffs into the lungs every 4 (four) hours as needed for wheezing or shortness of breath. 11/18/13  Yes Deneise Lever, MD  albuterol (PROVENTIL) (2.5 MG/3ML) 0.083% nebulizer solution Take 2.5 mg by nebulization every 6 (six) hours  as needed for wheezing or shortness of breath.   Yes Historical Provider, MD  cholecalciferol (VITAMIN D) 1000 UNITS tablet Take 1,000 Units by mouth daily.   Yes Historical Provider, MD  citalopram (CELEXA) 20 MG tablet Take 20 mg by mouth daily.   Yes Historical Provider, MD  clonazePAM (KLONOPIN) 0.5 MG tablet Take 0.5-1 tablets (0.25-0.5 mg total) by mouth 2 (two) times daily. 0.'25mg'$  in the am 0.'5mg'$  in the PM 04/09/15  Yes Thurnell Lose, MD  clopidogrel (PLAVIX) 75 MG tablet Take 75 mg by mouth daily with  breakfast.   Yes Historical Provider, MD  ferrous sulfate 325 (65 FE) MG tablet Take 325 mg by mouth daily.   Yes Historical Provider, MD  Fluticasone-Salmeterol (ADVAIR) 250-50 MCG/DOSE AEPB Inhale 1 puff into the lungs 2 (two) times daily.   Yes Historical Provider, MD  furosemide (LASIX) 40 MG tablet Take 40 mg by mouth daily.   Yes Historical Provider, MD  insulin aspart (NOVOLOG) 100 UNIT/ML injection Inject 5 Units into the skin 3 (three) times daily before meals. Give an additional 5 units if CBG > 150. Notify MD if CBG < 60 or > 400. Hold if BS < 70   Yes Historical Provider, MD  insulin glargine (LANTUS) 100 UNIT/ML injection Inject 10 Units into the skin at bedtime.     Yes Historical Provider, MD  ipratropium (ATROVENT) 0.02 % nebulizer solution Take 0.5 mg by nebulization every 4 (four) hours as needed for wheezing or shortness of breath.   Yes Historical Provider, MD  lisinopril (PRINIVIL,ZESTRIL) 2.5 MG tablet Take 2.5 mg by mouth daily. Hold if Systolic BP is less than 673 or Diastotic BP is less than 60   Yes Historical Provider, MD  OVER THE COUNTER MEDICATION Clorseptic cherry spray. Spray 2 sprays into throat every 4 hours as needed for 48 hours   Yes Historical Provider, MD  Ault 2. Provide and drink by mouth twice daily due to low protein   Yes Historical Provider, MD  prochlorperazine (COMPAZINE) 10 MG tablet Take 10 mg by mouth every 6 (six) hours as needed for nausea or vomiting.   Yes Historical Provider, MD  tiotropium (SPIRIVA) 18 MCG inhalation capsule Place 18 mcg into inhaler and inhale daily.   Yes Historical Provider, MD  traMADol (ULTRAM) 50 MG tablet Take 100 mg by mouth at bedtime as needed.   Yes Historical Provider, MD  aspirin 325 MG tablet Take 1 tablet (325 mg total) by mouth daily. Patient not taking: Reported on 05/18/2015 04/06/15   Lovett Calender, PA-C  docusate sodium (COLACE) 100 MG capsule Take 1 capsule (100 mg total) by mouth 2  (two) times daily. Patient not taking: Reported on 05/18/2015 04/06/15   Lovett Calender, PA-C  HYDROcodone-acetaminophen (NORCO) 5-325 MG per tablet Take 1-2 tablets by mouth every 6 (six) hours as needed for moderate pain. Patient not taking: Reported on 05/18/2015 04/09/15   Thurnell Lose, MD  ondansetron (ZOFRAN ODT) 4 MG disintegrating tablet Take 1 tablet (4 mg total) by mouth every 8 (eight) hours as needed for nausea. Patient not taking: Reported on 03/22/2015 07/15/13   Carmin Muskrat, MD   BP 176/42 mmHg  Pulse 117  Temp(Src) 97.9 F (36.6 C) (Oral)  Resp 18  SpO2 98% Physical Exam  Constitutional: She is oriented to person, place, and time. She appears well-developed and well-nourished.  HENT:  Head: Normocephalic and atraumatic.  Eyes: Conjunctivae and EOM are normal. Pupils are  equal, round, and reactive to light.  Neck: Normal range of motion. Neck supple.  Cardiovascular: Regular rhythm.  Exam reveals no gallop and no friction rub.   No murmur heard. Moderately tachycardic  Pulmonary/Chest: Effort normal and breath sounds normal. No respiratory distress. She has no wheezes. She has no rales. She exhibits no tenderness.  Diminished breath sounds on right  Abdominal: Soft. Bowel sounds are normal. She exhibits no distension and no mass. There is no tenderness. There is no rebound and no guarding.  Musculoskeletal: Normal range of motion. She exhibits no edema or tenderness.  Neurological: She is alert and oriented to person, place, and time.  Skin: Skin is warm and dry.  Psychiatric: She has a normal mood and affect. Her behavior is normal. Judgment and thought content normal.  Nursing note and vitals reviewed.   ED Course  Procedures (including critical care time) Results for orders placed or performed during the hospital encounter of 06/10/15  CBC with Differential/Platelet  Result Value Ref Range   WBC 11.3 (H) 4.0 - 10.5 K/uL   RBC 3.82 (L) 3.87 - 5.11 MIL/uL    Hemoglobin 10.9 (L) 12.0 - 15.0 g/dL   HCT 34.6 (L) 36.0 - 46.0 %   MCV 90.6 78.0 - 100.0 fL   MCH 28.5 26.0 - 34.0 pg   MCHC 31.5 30.0 - 36.0 g/dL   RDW 13.2 11.5 - 15.5 %   Platelets 439 (H) 150 - 400 K/uL   Neutrophils Relative % 74 43 - 77 %   Neutro Abs 8.3 (H) 1.7 - 7.7 K/uL   Lymphocytes Relative 14 12 - 46 %   Lymphs Abs 1.6 0.7 - 4.0 K/uL   Monocytes Relative 9 3 - 12 %   Monocytes Absolute 1.0 0.1 - 1.0 K/uL   Eosinophils Relative 3 0 - 5 %   Eosinophils Absolute 0.4 0.0 - 0.7 K/uL   Basophils Relative 0 0 - 1 %   Basophils Absolute 0.0 0.0 - 0.1 K/uL  Basic metabolic panel  Result Value Ref Range   Sodium 140 135 - 145 mmol/L   Potassium 3.5 3.5 - 5.1 mmol/L   Chloride 99 (L) 101 - 111 mmol/L   CO2 30 22 - 32 mmol/L   Glucose, Bld 41 (LL) 65 - 99 mg/dL   BUN 15 6 - 20 mg/dL   Creatinine, Ser 0.84 0.44 - 1.00 mg/dL   Calcium 9.1 8.9 - 10.3 mg/dL   GFR calc non Af Amer >60 >60 mL/min   GFR calc Af Amer >60 >60 mL/min   Anion gap 11 5 - 15  Glucose, capillary  Result Value Ref Range   Glucose-Capillary 388 (H) 65 - 99 mg/dL  Glucose, capillary  Result Value Ref Range   Glucose-Capillary 373 (H) 65 - 99 mg/dL   Comment 1 Notify RN   I-Stat Troponin, ED (not at [facility])  Result Value Ref Range   Troponin i, poc 0.01 0.00 - 0.08 ng/mL   Comment 3          CBG monitoring, ED  Result Value Ref Range   Glucose-Capillary 53 (L) 65 - 99 mg/dL  CBG monitoring, ED  Result Value Ref Range   Glucose-Capillary 102 (H) 65 - 99 mg/dL   Dg Chest 2 View  06/10/2015   CLINICAL DATA:  Increased shortness of breath and dry cough for several days. History of COPD and lung cancer. Oxygen dependent. Initial encounter.  EXAM: CHEST  2 VIEW  COMPARISON:  CT 05/10/2015.  Radiographs 04/05/2015.  FINDINGS: The heart size and mediastinal contours are stable. There is aortic atherosclerosis. The lungs are hyperinflated with diffusely increased interstitial markings and scattered scarring as  before. Asymmetric spiculated density in the left upper lobe appears unchanged, related to treated bronchogenic carcinoma. No new nodules identified radiographically. There is no confluent airspace opacity or pleural effusion. The bones appear unchanged.  IMPRESSION: Stable radiographic appearance of the chest with severe emphysema and residual left upper lobe nodule reportedly from treated lung cancer. No acute superimposed findings.   Electronically Signed   By: Richardean Sale M.D.   On: 06/10/2015 13:52     Imaging Review Dg Chest 2 View  06/10/2015   CLINICAL DATA:  Increased shortness of breath and dry cough for several days. History of COPD and lung cancer. Oxygen dependent. Initial encounter.  EXAM: CHEST  2 VIEW  COMPARISON:  CT 05/10/2015.  Radiographs 04/05/2015.  FINDINGS: The heart size and mediastinal contours are stable. There is aortic atherosclerosis. The lungs are hyperinflated with diffusely increased interstitial markings and scattered scarring as before. Asymmetric spiculated density in the left upper lobe appears unchanged, related to treated bronchogenic carcinoma. No new nodules identified radiographically. There is no confluent airspace opacity or pleural effusion. The bones appear unchanged.  IMPRESSION: Stable radiographic appearance of the chest with severe emphysema and residual left upper lobe nodule reportedly from treated lung cancer. No acute superimposed findings.   Electronically Signed   By: Richardean Sale M.D.   On: 06/10/2015 13:52     EKG Interpretation None      MDM   Final diagnoses:  Hypoglycemia  COPD exacerbation    Patient with COPD and cough, possible COPD exacerbation. Patient is afebrile.  Chest x-ray remarkable for severe emphysema and residual left upper lobe nodule from treated lung cancer, no superimposed evidence of infection.  4:03 PM Notified that patient has CBG of 41, will give 1 amp D50 now and a coke.  Patient takes regular insulin,  5 units before meals and Lantus 10 units at night.  Reports decreased oral intake, but has been taking the same amount of insulin.  IV access obtained under [place] by Dr. Reather Converse.  Repeat CBG is 53, will give another amp of dextrose and will give a meal.  Normal mental status.  Patient seen by and discussed with Dr. Reather Converse, who recommends admission to obs for hypoglycemia, cough and shortness of breath.    Montine Circle, PA-C 06/10/15 4665  Elnora Morrison, MD 06/19/15 [phone]

## 2015-06-10 NOTE — ED Notes (Addendum)
Patient c/o dry hacking cough x a 2 weeks. Patient is on home O2 2L/min during the day and 3L at night.

## 2015-06-11 DIAGNOSIS — E119 Type 2 diabetes mellitus without complications: Secondary | ICD-10-CM

## 2015-06-11 DIAGNOSIS — E440 Moderate protein-calorie malnutrition: Secondary | ICD-10-CM | POA: Insufficient documentation

## 2015-06-11 LAB — URINALYSIS, ROUTINE W REFLEX MICROSCOPIC
BILIRUBIN URINE: NEGATIVE
Ketones, ur: NEGATIVE mg/dL
Nitrite: NEGATIVE
Protein, ur: 30 mg/dL — AB
Specific Gravity, Urine: 1.017 (ref 1.005–1.030)
Urobilinogen, UA: 0.2 mg/dL (ref 0.0–1.0)
pH: 6 (ref 5.0–8.0)

## 2015-06-11 LAB — GLUCOSE, CAPILLARY
Glucose-Capillary: 272 mg/dL — ABNORMAL HIGH (ref 65–99)
Glucose-Capillary: 220 mg/dL — ABNORMAL HIGH (ref 65–99)
Glucose-Capillary: 234 mg/dL — ABNORMAL HIGH (ref 65–99)
Glucose-Capillary: 212 mg/dL — ABNORMAL HIGH (ref 65–99)
Glucose-Capillary: 167 mg/dL — ABNORMAL HIGH (ref 65–99)

## 2015-06-11 LAB — BASIC METABOLIC PANEL
Anion gap: 11 (ref 5–15)
BUN: 14 mg/dL (ref 6–20)
CHLORIDE: 101 mmol/L (ref 101–111)
CO2: 27 mmol/L (ref 22–32)
Calcium: 9.3 mg/dL (ref 8.9–10.3)
Creatinine, Ser: 0.95 mg/dL (ref 0.44–1.00)
GFR calc Af Amer: >60 mL/min (ref >60)
GFR, EST NON AFRICAN AMERICAN: 54 mL/min — AB (ref >60)
GLUCOSE: 243 mg/dL — AB (ref 65–99)
Potassium: 4.9 mmol/L (ref 3.5–5.1)
SODIUM: 139 mmol/L (ref 135–145)

## 2015-06-11 LAB — CBC
HCT: 37.3 % (ref 36.0–46.0)
HEMOGLOBIN: 11.4 g/dL — AB (ref 12.0–15.0)
MCH: 27.5 pg (ref 26.0–34.0)
MCHC: 30.6 g/dL (ref 30.0–36.0)
MCV: 90.1 fL (ref 78.0–100.0)
Platelets: 420 10*3/uL — ABNORMAL HIGH (ref 150–400)
RBC: 4.14 MIL/uL (ref 3.87–5.11)
RDW: 13.2 % (ref 11.5–15.5)
WBC: 5.8 10*3/uL (ref 4.0–10.5)

## 2015-06-11 LAB — MRSA PCR SCREENING: MRSA by PCR: POSITIVE — AB

## 2015-06-11 LAB — APTT: aPTT: 29 seconds (ref 24–37)

## 2015-06-11 LAB — HEMOGLOBIN AND HEMATOCRIT, BLOOD
HCT: 35.2 % — ABNORMAL LOW (ref 36.0–46.0)
Hemoglobin: 10.9 g/dL — ABNORMAL LOW (ref 12.0–15.0)

## 2015-06-11 LAB — URINE MICROSCOPIC-ADD ON

## 2015-06-11 LAB — PROTIME-INR
INR: 1.03 (ref 0.00–1.49)
Prothrombin Time: 13.7 seconds (ref 11.6–15.2)

## 2015-06-11 LAB — MAGNESIUM: Magnesium: 2.7 mg/dL — ABNORMAL HIGH (ref 1.7–2.4)

## 2015-06-11 MED ORDER — HYDROCORTISONE ACETATE 25 MG RE SUPP
25.0000 mg | Freq: Two times a day (BID) | RECTAL | Status: DC
  Administered 2015-06-11 – 2015-06-12 (×3): 25 mg via RECTAL
  Filled 2015-06-11 (×4): qty 1

## 2015-06-11 MED ORDER — CHLORHEXIDINE GLUCONATE CLOTH 2 % EX PADS
6.0000 | MEDICATED_PAD | Freq: Every day | CUTANEOUS | Status: DC
  Administered 2015-06-12: 6 via TOPICAL

## 2015-06-11 MED ORDER — METHYLPREDNISOLONE SODIUM SUCC 40 MG IJ SOLR
40.0000 mg | INTRAMUSCULAR | Status: DC
  Administered 2015-06-12: 40 mg via INTRAVENOUS
  Filled 2015-06-11 (×2): qty 1

## 2015-06-11 MED ORDER — INSULIN GLARGINE 100 UNIT/ML ~~LOC~~ SOLN
10.0000 [IU] | Freq: Every day | SUBCUTANEOUS | Status: DC
  Administered 2015-06-11 – 2015-06-12 (×2): 10 [IU] via SUBCUTANEOUS
  Filled 2015-06-11 (×3): qty 0.1

## 2015-06-11 MED ORDER — MUPIROCIN 2 % EX OINT
1.0000 "application " | TOPICAL_OINTMENT | Freq: Two times a day (BID) | CUTANEOUS | Status: DC
  Administered 2015-06-11 – 2015-06-12 (×3): 1 via NASAL
  Filled 2015-06-11: qty 22

## 2015-06-11 MED ORDER — FLEET ENEMA 7-19 GM/118ML RE ENEM
1.0000 | ENEMA | Freq: Once | RECTAL | Status: AC
  Administered 2015-06-11: 1 via RECTAL
  Filled 2015-06-11: qty 1

## 2015-06-11 NOTE — Clinical Social Work Note (Signed)
Clinical Social Work Assessment  Patient Details  Name: Jennifer Duran MRN: [id number] Date of Birth: 1928/12/31  Date of referral:  06/11/15               Reason for consult:  Facility Placement, Discharge Planning                Permission sought to share information with:    Permission granted to share information::     Name::        Agency::     Relationship::     Contact Information:     Housing/Transportation Living arrangements for the past 2 months:  [place] of Information:  Patient, Other (Comment Required) Janett Billow ( resident care coordinator )) Patient Interpreter Needed:  None Criminal Activity/Legal Involvement Pertinent to Current Situation/Hospitalization:  No - Comment as needed Significant Relationships:  Adult Children Lives with:    Do you feel safe going back to the place where you live?  Yes Need for family participation in patient care:  Yes (Comment)  Care giving concerns:  Appropriate level of care needs to be determined   Social Worker assessment / plan:  Pt hospitalized on 06/10/15 with hypoglycemia and COPD exacerbation. Pt is from [facility]F. CSW met with pt to assist with d/c planning. Pt reports that she would like to return to ALF when stable for d/c. PT evaluated pt this am and has recommended SNF placement. Pt was very tired and ambulation/transfers were not addressed at this time. CSW has sent clinicals to ALF for review. PT requested to see pt again, when possible, to complete evaluation. Pt has [employer] which requires prior authorization for SNF. CSW contacted pt's son. Mr. Britain was unable to speak with CSW at the time, but will call CSW back later today to assist with d/c planning.   Employment status:  Retired Nurse, adult PT Recommendations:  [place] / Referral to community resources:   (SNF list will be provided if needed.)  Patient/Family's  Response to care:  To be addressed when son returns CSW call.  Patient/Family's Understanding of and Emotional Response to Diagnosis, Current Treatment, and Prognosis:  Pt would like to return to ALF at d/c. CSW will speak with son and follow up with ALF to see if this is possible. Pt was at [facility] this past April for rehab..  Emotional Assessment Appearance:  Appears stated age Attitude/Demeanor/Rapport:  Other (cooperative) Affect (typically observed):  Calm, Pleasant, Appropriate Orientation:  Oriented to Self, Oriented to Place, Oriented to Situation, Oriented to  Time Alcohol / Substance use:  Not Applicable Psych involvement (Current and /or in the community):  No (Comment)  Discharge Needs  Concerns to be addressed:  Discharge Planning Concerns Readmission within the last 30 days:  No Current discharge risk:  None Barriers to Discharge:  No Barriers Identified   Andreya Lacks, Randall An, LCSW 06/11/2015, 1:39 PM

## 2015-06-11 NOTE — Progress Notes (Addendum)
PATIENT DETAILS Name: Jennifer Duran Age: 81 y.o. Sex: female Date of Birth: 1929/09/27 Admit Date: 06/10/2015 Admitting Physician Evalee Mutton Kristeen Mans, MD PCP:No primary care provider on file.  Subjective: Breathing is much better. CBGs now stable. Claims that she has vaginal bleeding for the past 3-4 weeks-was told at ALF that her insurance will not cover GYN services.  Assessment/Plan: Principal Problem: Hypoglycemia : Suspect secondary to poor oral intake, and continued use of scheduled pre meal NovoLog and Lantus.Await A1C.  CBGs now significantly elevated-placed back on Lantus and SSI-we will allow some permissive hyper glycemia and avoid tight glycemic control this frail 81 year old patient.  COPD exacerbation:  continue IV steroids, and with nebulized bronchodilators. Improved significantly, Will start tapering Solu-Medrol  Corpulmonale:no significant leg edema-appears stable-continue Lasix  Vaginal Bleeding:ongoing for the past few weeks, will need endometrial biopsy-have arranged outpatient GYN follow up on 6/29. Will stop Plavix-as patient claims that bleeding is getting heavier-no hx of CAD or CVA-not sure why on plavix  Essential hypertension: Continue with Lasix and Lisinopril  Generalized anxiety disorder : continue with clonazepam.   Lung mass : has a known lung mass-reviewed prior notes-because of her severe COPD this was never biopsied- she has undergone radiation treatment for this. She will follow-up radiation oncology at her next scheduled appointment. I see no indication for ordering any radiological studies at this time  Chronic hypoxic respiratory failure: continue home O2  Anxiety:controlled-continue prn Benzo's  Generalized weakness: PT evaluation   Disposition: Remain inpatient-ALF or SNF 6/14  Antimicrobial agents  See below  Anti-infectives    None      DVT Prophylaxis: SCD's  Code Status:  DNR  Family  Communication None at bedside  Procedures: None  CONSULTS:  None  Time spent 30 minutes-Greater than 50% of this time was spent in counseling, explanation of diagnosis, planning of further management, and coordination of care.  MEDICATIONS: Scheduled Meds: . antiseptic oral rinse  7 mL Mouth Rinse BID  . budesonide (PULMICORT) nebulizer solution  0.25 mg Nebulization BID  . cholecalciferol  1,000 Units Oral Daily  . citalopram  20 mg Oral Daily  . clonazePAM  0.25 mg Oral Daily  . clonazePAM  0.5 mg Oral QHS  . clopidogrel  75 mg Oral Q breakfast  . feeding supplement (ENSURE ENLIVE)  237 mL Oral BID BM  . ferrous sulfate  325 mg Oral Daily  . furosemide  40 mg Oral Daily  . guaiFENesin  600 mg Oral BID  . insulin aspart  0-5 Units Subcutaneous QHS  . insulin aspart  0-9 Units Subcutaneous TID WC  . insulin glargine  10 Units Subcutaneous Daily  . ipratropium-albuterol  3 mL Nebulization TID  . lisinopril  2.5 mg Oral Daily  . methylPREDNISolone (SOLU-MEDROL) injection  40 mg Intravenous Q12H  . sodium chloride  3 mL Intravenous Q12H   Continuous Infusions:  PRN Meds:.sodium chloride, acetaminophen **OR** acetaminophen, albuterol, benzonatate, guaiFENesin-dextromethorphan, promethazine, sodium chloride, traMADol    PHYSICAL EXAM: Vital signs in last 24 hours: Filed Vitals:   06/11/15 0540 06/11/15 0810 06/11/15 0816 06/11/15 0938  BP: 135/62   135/62  Pulse: 88     Temp: 97.8 F (36.6 C)     TempSrc: Oral     Resp: 20     Height:      Weight:      SpO2: 99% 95% 96%  Weight change:  Filed Weights   06/10/15 1855  Weight: 54.6 kg (120 lb 5.9 oz)   Body mass index is 21.33 kg/(m^2).   Gen Exam: Awake and alert with clear speech.   Neck: Supple, No JVD.  Chest: B/L Clear-but decreased air entry at bilateral bases CVS: S1 S2 Regular, no murmurs.  Abdomen: soft, BS +, non tender, non distended.  Extremities: no edema, lower extremities warm to  touch. Neurologic: Non Focal.   Skin: No Rash.   Wounds: N/A.    Intake/Output from previous day:  Intake/Output Summary (Last 24 hours) at 06/11/15 1056 Last data filed at 06/11/15 0900  Gross per 24 hour  Intake    100 ml  Output    252 ml  Net   -152 ml     LAB RESULTS: CBC  Recent Labs Lab 06/10/15 1530 06/11/15 0502  WBC 11.3* 5.8  HGB 10.9* 11.4*  HCT 34.6* 37.3  PLT 439* 420*  MCV 90.6 90.1  MCH 28.5 27.5  MCHC 31.5 30.6  RDW 13.2 13.2  LYMPHSABS 1.6  --   MONOABS 1.0  --   EOSABS 0.4  --   BASOSABS 0.0  --     Chemistries   Recent Labs Lab 06/10/15 1530 06/11/15 0502  NA 140 139  K 3.5 4.9  CL 99* 101  CO2 30 27  GLUCOSE 41* 243*  BUN 15 14  CREATININE 0.84 0.95  CALCIUM 9.1 9.3  MG  --  2.7*    CBG:  Recent Labs Lab 06/10/15 1734 06/10/15 2122 06/10/15 2142 06/11/15 0306 06/11/15 0802  GLUCAP 102* 388* 373* 272* 220*    GFR Estimated Creatinine Clearance: 37.1 mL/min (by C-G formula based on Cr of 0.95).  Coagulation profile  Recent Labs Lab 06/11/15 0520  INR 1.03    Cardiac Enzymes No results for input(s): CKMB, TROPONINI, MYOGLOBIN in the last 168 hours.  Invalid input(s): CK  Invalid input(s): POCBNP No results for input(s): DDIMER in the last 72 hours. No results for input(s): HGBA1C in the last 72 hours. No results for input(s): CHOL, HDL, LDLCALC, TRIG, CHOLHDL, LDLDIRECT in the last 72 hours. No results for input(s): TSH, T4TOTAL, T3FREE, THYROIDAB in the last 72 hours.  Invalid input(s): FREET3 No results for input(s): VITAMINB12, FOLATE, FERRITIN, TIBC, IRON, RETICCTPCT in the last 72 hours. No results for input(s): LIPASE, AMYLASE in the last 72 hours.  Urine Studies No results for input(s): UHGB, CRYS in the last 72 hours.  Invalid input(s): UACOL, UAPR, USPG, UPH, UTP, UGL, UKET, UBIL, UNIT, UROB, ULEU, UEPI, UWBC, URBC, UBAC, CAST, UCOM, BILUA  MICROBIOLOGY: Recent Results (from the past 240  hour(s))  MRSA PCR Screening     Status: Abnormal   Collection Time: 06/11/15  8:04 AM  Result Value Ref Range Status   MRSA by PCR POSITIVE (A) NEGATIVE Final    Comment:        The GeneXpert MRSA Assay (FDA approved for NASAL specimens only), is one component of a comprehensive MRSA colonization surveillance program. It is not intended to diagnose MRSA infection nor to guide or monitor treatment for MRSA infections. RESULT CALLED TO, READ BACK BY AND VERIFIED WITHNelda Severe RN AT [phone] ON 06.13.16 BY SHUEA     RADIOLOGY STUDIES/RESULTS: Dg Chest 2 View  06/10/2015   CLINICAL DATA:  Increased shortness of breath and dry cough for several days. History of COPD and lung cancer. Oxygen dependent. Initial encounter.  EXAM: CHEST  2  VIEW  COMPARISON:  CT 05/10/2015.  Radiographs 04/05/2015.  FINDINGS: The heart size and mediastinal contours are stable. There is aortic atherosclerosis. The lungs are hyperinflated with diffusely increased interstitial markings and scattered scarring as before. Asymmetric spiculated density in the left upper lobe appears unchanged, related to treated bronchogenic carcinoma. No new nodules identified radiographically. There is no confluent airspace opacity or pleural effusion. The bones appear unchanged.  IMPRESSION: Stable radiographic appearance of the chest with severe emphysema and residual left upper lobe nodule reportedly from treated lung cancer. No acute superimposed findings.   Electronically Signed   By: Richardean Sale M.D.   On: 06/10/2015 13:52    Oren Binet, MD  [facility] Pager:[phone]  If 7PM-7AM, please contact night-coverage [email or web address] Password [email or web address] 06/11/2015, 10:56 AM   LOS: 1 day

## 2015-06-11 NOTE — Evaluation (Signed)
Physical Therapy Evaluation Patient Details Name: Jennifer Duran MRN: [id number] DOB: 05-04-1929 Today's Date: 06/11/2015   History of Present Illness  81 y.o. female with a Past Medical History of COPD on home O2, chronic respiratory failure, Type 2 diabetes on insulin, Hypertension, cor pulmonale, PVD, L hip cannulated pinning 04/06/15, lung mass and admitted for hypoglycemia and COPD exacerbation  Clinical Impression  Pt admitted with above diagnosis. Pt currently with functional limitations due to the deficits listed below (see PT Problem List).  Pt will benefit from skilled PT to increase their independence and safety with mobility to allow discharge to the venue listed below.  Pt very tired and keep eyes closed most of the time. Pt only agreeable to sit EOB, stating she feels very weak.  She reports she is from ALF and appears agreeable to d/c to rehab section/SNF.     Follow Up Recommendations SNF    Equipment Recommendations  None recommended by PT    Recommendations for Other Services       Precautions / Restrictions Precautions Precautions: Fall Precaution Comments: chronic O2      Mobility  Bed Mobility Overal bed mobility: Needs Assistance Bed Mobility: Supine to Sit;Sit to Supine     Supine to sit: Min assist Sit to supine: Min assist   General bed mobility comments: assist for trunk upright and LEs onto bed, slow and effortful movement  Transfers Overall transfer level:  (pt declined)                  Ambulation/Gait                Stairs            Wheelchair Mobility    Modified Rankin (Stroke Patients Only)       Balance Overall balance assessment: Needs assistance Sitting-balance support: Feet supported;Bilateral upper extremity supported Sitting balance-Leahy Scale: Poor Sitting balance - Comments: heavily leaning on R UE against elevated HOB, very sleepy, mostly keeping eyes closed Postural control: Right lateral lean                                   Pertinent Vitals/Pain Pain Assessment: No/denies pain    Home Living Family/patient expects to be discharged to:: Assisted living               Home Equipment: Walker - 4 wheels Additional Comments: wears oxygen 24/7, 2L in day, 4L at night    Prior Function Level of Independence: Independent with assistive device(s)               Hand Dominance        Extremity/Trunk Assessment               Lower Extremity Assessment: Generalized weakness         Communication   Communication: Other (comment) (difficult to understand at times)  Cognition Arousal/Alertness: Lethargic Behavior During Therapy: WFL for tasks assessed/performed Overall Cognitive Status: No family/caregiver present to determine baseline cognitive functioning                      General Comments      Exercises        Assessment/Plan    PT Assessment Patient needs continued PT services  PT Diagnosis Generalized weakness   PT Problem List Decreased strength;Decreased activity tolerance;Decreased balance;Decreased mobility;Decreased knowledge of use of DME;Cardiopulmonary status  limiting activity  PT Treatment Interventions DME instruction;Gait training;Functional mobility training;Patient/family education;Therapeutic activities;Therapeutic exercise   PT Goals (Current goals can be found in the Care Plan section) Acute Rehab PT Goals PT Goal Formulation: With patient Time For Goal Achievement: 06/25/15 Potential to Achieve Goals: Good    Frequency Min 2X/week   Barriers to discharge        Co-evaluation               End of Session Equipment Utilized During Treatment: Oxygen Activity Tolerance: Patient limited by fatigue Patient left: in bed;with call bell/phone within reach           Time: 1016-1027 PT Time Calculation (min) (ACUTE ONLY): 11 min   Charges:   PT Evaluation $Initial PT Evaluation Tier I:  1 Procedure     PT G Codes:        Martese Vanatta,KATHrine E 06/11/2015, 12:42 PM Carmelia Bake, PT, DPT 06/11/2015 Pager: [phone]

## 2015-06-11 NOTE — Care Management Note (Signed)
Case Management Note  Patient Details  Name: Jennifer Duran MRN: [id number] Date of Birth: January 06, 1929  Subjective/Objective:       81 yo female admitted with COPD exacerbation             Action/Plan:  Patient was sleeping, spoke with SIL, Ronalee Belts, at bedside. He stated that the patient's son, Remo Lipps is in [place] so he is helping out. He confirmed that the patient resides at [facility] and uses a walker and a wheelchair and has home O2. He stated that she has been in rehab in the past. Await PT recommendations. Expected Discharge Date:                  Expected Discharge Plan:  Skilled Nursing Facility  In-House Referral:  Clinical Social Work  Discharge planning Services  CM Consult  Post Acute Care Choice:    Choice offered to:     DME Arranged:    DME Agency:     HH Arranged:    [place] Agency:     Status of Service:     Medicare Important Message Given:  N/A - LOS <3 / Initial given by admissions Date Medicare IM Given:    Medicare IM give by:    Date Additional Medicare IM Given:    Additional Medicare Important Message give by:     If discussed at [place] of Stay Meetings, dates discussed:    Additional Comments:  Scot Dock, RN 06/11/2015, 11:43 AM

## 2015-06-11 NOTE — Progress Notes (Signed)
Pt incont of urine this am.  The urine did not appear bloody but pt has moderate amount of vaginal bleeding.  Pt states she has had vaginal bleeding x two weeks but states "insurance won't pay for gynecologist". Notified NP, will relay to MD this am.   Hoyle Barr

## 2015-06-11 NOTE — Progress Notes (Signed)
Initial Nutrition Assessment  DOCUMENTATION CODES:  Non-severe (moderate) malnutrition in context of acute illness/injury  INTERVENTION: - Will order supplements when pt more willing to consume PO - RD will continue to monitor for needs  NUTRITION DIAGNOSIS:  Inadequate oral intake related to nausea, vomiting as evidenced by per patient/family report.  GOAL:  Patient will meet greater than or equal to 90% of their needs  MONITOR:  PO intake, Weight trends, Labs, I & O's  REASON FOR ASSESSMENT:  Malnutrition Screening Tool  ASSESSMENT: Per H&P, 81 y.o. female with a PMH of COPD on home O2, chronic respiratory failure, Type 2 diabetes on insulin, Hypertension, cor pulmonale who presents today with the above noted complaint. Per patient, for the past 2 -3 days she has been feeling weaker than usual, yesterday she started slowly having worsening of her dry cough and then started getting shortness of breath. Over the course of the day today, shortness of breath gradually worsened, she had very little relief with nebulized bronchodilators.  Pt seen for MST. BMI indicates normal weight status. Pt reports poor appetite PTA and that she was having nausea and sometimes vomiting with intakes. She did not touch breakfast tray, which is still in her room at time of visit this afternoon. Pt was not drinking supplements PTA. Will order supplements once willingness to consume PO improves and pt is feeling less nauseous.   She indicates UBW of 122 lbs which is consistent with current weight. No weight loss recently. Physical assessment indicates mild muscle and fat wasting.   Not meeting needs. Medications reviewed. Labs reviewed; CBGs: 53-373 mg/dL.  Height:  Ht Readings from Last 1 Encounters:  06/10/15 '5\' 3"'$  (1.6 m)    Weight:  Wt Readings from Last 1 Encounters:  06/10/15 120 lb 5.9 oz (54.6 kg)    Ideal Body Weight:  52.3 kg (kg)  Wt Readings from Last 10 Encounters:  06/10/15  120 lb 5.9 oz (54.6 kg)  05/18/15 118 lb (53.524 kg)  05/01/15 119 lb 6.4 oz (54.159 kg)  04/26/15 118 lb 1.6 oz (53.57 kg)  04/10/15 124 lb (56.246 kg)  04/05/15 124 lb (56.246 kg)  03/22/15 125 lb 3.2 oz (56.79 kg)  09/21/14 118 lb 6.4 oz (53.706 kg)  08/31/14 117 lb 11.2 oz (53.388 kg)  04/27/14 120 lb 12.8 oz (54.795 kg)    BMI:  Body mass index is 21.33 kg/(m^2).  Estimated Nutritional Needs:  Kcal:  1300-1500  Protein:  50-60 grams  Fluid:  2 L/day  Skin:  Reviewed, no issues  Diet Order:  Diet heart healthy/carb modified Room service appropriate?: Yes; Fluid consistency:: Thin  EDUCATION NEEDS:  No education needs identified at this time   Intake/Output Summary (Last 24 hours) at 06/11/15 1433 Last data filed at 06/11/15 0900  Gross per 24 hour  Intake    100 ml  Output    252 ml  Net   -152 ml    Last BM:  6/12    Jarome Matin, RD, LDN Inpatient Clinical Dietitian Pager # [phone] After hours/weekend pager # [phone]

## 2015-06-11 NOTE — Progress Notes (Addendum)
Giving patient prescribed suppository discovered patient is impacted.  MD made aware.  Orders to be placed 0220 Fleets enema given continued vaginal bleeding

## 2015-06-12 DIAGNOSIS — I10 Essential (primary) hypertension: Secondary | ICD-10-CM

## 2015-06-12 LAB — HEMOGLOBIN A1C
HEMOGLOBIN A1C: 5.4 % (ref 4.8–5.6)
MEAN PLASMA GLUCOSE: 108 mg/dL

## 2015-06-12 LAB — GLUCOSE, CAPILLARY
Glucose-Capillary: 118 mg/dL — ABNORMAL HIGH (ref 65–99)
Glucose-Capillary: 232 mg/dL — ABNORMAL HIGH (ref 65–99)

## 2015-06-12 LAB — CBC
HCT: 34.8 % — ABNORMAL LOW (ref 36.0–46.0)
Hemoglobin: 10.7 g/dL — ABNORMAL LOW (ref 12.0–15.0)
MCH: 27.8 pg (ref 26.0–34.0)
MCHC: 30.7 g/dL (ref 30.0–36.0)
MCV: 90.4 fL (ref 78.0–100.0)
Platelets: 448 10*3/uL — ABNORMAL HIGH (ref 150–400)
RBC: 3.85 MIL/uL — AB (ref 3.87–5.11)
RDW: 13.4 % (ref 11.5–15.5)
WBC: 10.8 10*3/uL — AB (ref 4.0–10.5)

## 2015-06-12 MED ORDER — ALBUTEROL SULFATE (2.5 MG/3ML) 0.083% IN NEBU: 2.5000 mg | INHALATION_SOLUTION | RESPIRATORY_TRACT | Status: AC | PRN

## 2015-06-12 MED ORDER — INSULIN ASPART 100 UNIT/ML ~~LOC~~ SOLN: SUBCUTANEOUS | Status: DC

## 2015-06-12 MED ORDER — PREDNISONE 10 MG PO TABS: ORAL_TABLET | ORAL | Status: AC

## 2015-06-12 MED ORDER — ENSURE ENLIVE PO LIQD: 237.0000 mL | Freq: Two times a day (BID) | ORAL | Status: DC

## 2015-06-12 MED ORDER — IPRATROPIUM-ALBUTEROL 0.5-2.5 (3) MG/3ML IN SOLN: 3.0000 mL | Freq: Three times a day (TID) | RESPIRATORY_TRACT | Status: AC

## 2015-06-12 MED ORDER — IPRATROPIUM-ALBUTEROL 0.5-2.5 (3) MG/3ML IN SOLN: 3.0000 mL | Freq: Two times a day (BID) | RESPIRATORY_TRACT | Status: DC

## 2015-06-12 MED ORDER — CLONAZEPAM 0.5 MG PO TABS: 0.2500 mg | ORAL_TABLET | Freq: Two times a day (BID) | ORAL | Status: AC

## 2015-06-12 MED ORDER — BENZONATATE 100 MG PO CAPS: 100.0000 mg | ORAL_CAPSULE | Freq: Three times a day (TID) | ORAL | Status: AC | PRN

## 2015-06-12 MED ORDER — TRAMADOL HCL 50 MG PO TABS: 100.0000 mg | ORAL_TABLET | Freq: Every evening | ORAL | Status: AC | PRN

## 2015-06-12 MED ORDER — GUAIFENESIN ER 600 MG PO TB12: 600.0000 mg | ORAL_TABLET | Freq: Two times a day (BID) | ORAL | Status: AC

## 2015-06-12 MED ORDER — INSULIN GLARGINE 100 UNIT/ML ~~LOC~~ SOLN: 8.0000 [IU] | Freq: Every day | SUBCUTANEOUS | Status: AC

## 2015-06-12 NOTE — Progress Notes (Signed)
Patient for d/c today back to [facility]. They will provide HHPT- Son and patient agreeable to this plan- plan transfer via EMS. Eduard Clos, MSW, Latanya Presser [phone]

## 2015-06-12 NOTE — Progress Notes (Addendum)
Physical Therapy Treatment Patient Details Name: Jennifer Duran MRN: [id number] DOB: 01-06-1929 Today's Date: 06/12/2015    History of Present Illness 81 y.o. female with a Past Medical History of COPD on home O2, chronic respiratory failure, Type 2 diabetes on insulin, Hypertension, cor pulmonale, PVD, L hip cannulated pinning 04/06/15, lung mass and admitted for hypoglycemia and COPD exacerbation    PT Comments    Pt resides at ALF facility and admits she has not been amb to dinning room "lately" since being ill. Currently, pt requires assistance with all mobility and demonstrates limited activity tolerance with increased weakness. Pt will need increased assist level at ALF if declines SNF rec.  Follow Up Recommendations  SNF per evaluating PT     Equipment Recommendations  None recommended by PT    Recommendations for Other Services       Precautions / Restrictions Precautions Precautions: Fall Precaution Comments: chronic O2 Restrictions Weight Bearing Restrictions: No    Mobility  Bed Mobility Overal bed mobility: Needs Assistance Bed Mobility: Supine to Sit     Supine to sit: Min assist     General bed mobility comments: increased time esp to scoot self to EOB.  Required long rest break due to 2/4 DOE.    Transfers Overall transfer level: Needs assistance Equipment used: Rolling walker (2 wheeled) Transfers: Sit to/from Stand Sit to Stand: Min assist;Mod assist         General transfer comment: assisted off elevated bed with 25% VC's on proper hand placement.  Assisted off lower level toilet, pt required Mod Assist.  Also, VC's for safety with turns and backward gait.    Ambulation/Gait Ambulation/Gait assistance: Min assist;Mod assist Ambulation Distance (Feet): 16 Feet (8 feet x 2 ) Assistive device: Rolling walker (2 wheeled) Gait Pattern/deviations: Step-to pattern;Step-through pattern;Decreased step length - right;Decreased step length -  left;Decreased stride length;Trunk flexed Gait velocity: decreased   General Gait Details: Very limited amb distance due to MAX c/o fatigue/weakness and 3/4 DOE.  Very unsteady gait as well.  Near fall in bathroom stepping backward to commode (gait belt utilized).  Pt was only able to tolerate short distance to and from bathroom.     Stairs            Wheelchair Mobility    Modified Rankin (Stroke Patients Only)       Balance                                    Cognition Arousal/Alertness: Awake/alert Behavior During Therapy: WFL for tasks assessed/performed Overall Cognitive Status: Within Functional Limits for tasks assessed                      Exercises      General Comments        Pertinent Vitals/Pain Pain Assessment: No/denies pain    Home Living                      Prior Function            PT Goals (current goals can now be found in the care plan section) Progress towards PT goals: Progressing toward goals    Frequency  Min 2X/week    PT Plan      Co-evaluation             End of Session Equipment Utilized During Treatment:  Gait belt;Oxygen Activity Tolerance: Patient limited by fatigue;Treatment limited secondary to medical complications (Comment) (dyspnea) Patient left: in chair;with call bell/phone within reach     Time: 6283-1517 PT Time Calculation (min) (ACUTE ONLY): 27 min  Charges:  $Gait Training: 8-22 mins $Therapeutic Activity: 8-22 mins                    G Codes:      Rica Koyanagi  PTA [facility]  Acute  Rehab Pager      [phone]

## 2015-06-12 NOTE — Discharge Summary (Addendum)
PATIENT DETAILS Name: Jennifer Duran Age: 81 y.o. Sex: female Date of Birth: July 23, 1929 MRN: [id number]. Admitting Physician: Jonetta Osgood, MD PCP:No primary care provider on file.  Admit Date: 06/10/2015 Discharge date: 06/12/2015  Recommendations for Outpatient Follow-up:  1. Please ensure patient follows with GYN-have made appointment on 6/29.  2. May need to stop insulin if hypoglycemia recurs-please monitor CBGs closely.  PRIMARY DISCHARGE DIAGNOSIS:  Principal Problem:   COPD exacerbation Active Problems:   Essential hypertension   Generalized anxiety disorder   Lung mass   Generalized weakness   DM type 2, goal A1c below 7   Hypoglycemia   Malnutrition of moderate degree      PAST MEDICAL HISTORY: Past Medical History  Diagnosis Date  . Allergic rhinitis, cause unspecified   . Unspecified essential hypertension   . Cor pulmonale   . COPD (chronic obstructive pulmonary disease)   . Osteoporosis   . Hashimoto's disease   . Peripheral vascular disease   . Hyperlipidemia   . Depression   . Urinary frequency   . Other atopic dermatitis and related conditions   . Abnormal involuntary movements(781.0)   . Hyposmolality and/or hyponatremia   . Peripheral vascular disease, unspecified   . Lung mass   . Insomnia   . History of radiation therapy 08/08/2013, 08/10/2013, 08/16/2013    SBRT 54 Gy to left upper lobe pulmonary nodule  . Type II or unspecified type diabetes mellitus without mention of complication, not stated as uncontrolled     DISCHARGE MEDICATIONS: Discharge Medication List as of 06/12/2015  1:22 PM    START taking these medications   Details  benzonatate (TESSALON) 100 MG capsule Take 1 capsule (100 mg total) by mouth 3 (three) times daily as needed for cough., Starting 06/12/2015, Until Discontinued, Print    guaiFENesin (MUCINEX) 600 MG 12 hr tablet Take 1 tablet (600 mg total) by mouth 2 (two) times daily., Starting 06/12/2015, Until  Discontinued, Print    ipratropium-albuterol (DUONEB) 0.5-2.5 (3) MG/3ML SOLN Take 3 mLs by nebulization 3 (three) times daily., Starting 06/12/2015, Until Discontinued, Print    predniSONE (DELTASONE) 10 MG tablet Take 4 tablets (40 mg) daily for 2 days, then, Take 3 tablets (30 mg) daily for 2 days, then, Take 2 tablets (20 mg) daily for 2 days, then, Take 1 tablets (10 mg) daily for 1 days, then stop, Print           CONTINUE these medications which have CHANGED   Details  albuterol (PROVENTIL) (2.5 MG/3ML) 0.083% nebulizer solution Take 3 mLs (2.5 mg total) by nebulization every 2 (two) hours as needed for wheezing or shortness of breath., Starting 06/12/2015, Until Discontinued, Print    clonazePAM (KLONOPIN) 0.5 MG tablet Take 0.5-1 tablets (0.25-0.5 mg total) by mouth 2 (two) times daily. 0.'25mg'$  in the am 0.'5mg'$  in the PM, Starting 06/12/2015, Until Discontinued, Print    insulin glargine (LANTUS) 100 UNIT/ML injection Inject 0.08 mLs (8 Units total) into the skin at bedtime., Starting 06/12/2015, Until Discontinued, Print    traMADol (ULTRAM) 50 MG tablet Take 2 tablets (100 mg total) by mouth at bedtime as needed., Starting 06/12/2015, Until Discontinued, Print           CONTINUE these medications which have NOT CHANGED   Details  albuterol (PROVENTIL HFA;VENTOLIN HFA) 108 (90 BASE) MCG/ACT inhaler Inhale 2 puffs into the lungs every 4 (four) hours as needed for wheezing or shortness of breath., Starting 11/18/2013, Until Discontinued, Normal  cholecalciferol (VITAMIN D) 1000 UNITS tablet Take 1,000 Units by mouth daily., Until Discontinued, Historical Med    citalopram (CELEXA) 20 MG tablet Take 20 mg by mouth daily., Until Discontinued, Historical Med    ferrous sulfate 325 (65 FE) MG tablet Take 325 mg by mouth daily., Until Discontinued, Historical Med    Fluticasone-Salmeterol (ADVAIR) 250-50 MCG/DOSE AEPB Inhale 1 puff into the lungs 2 (two) times daily., Until  Discontinued, Historical Med    furosemide (LASIX) 40 MG tablet Take 40 mg by mouth daily., Until Discontinued, Historical Med    lisinopril (PRINIVIL,ZESTRIL) 2.5 MG tablet Take 2.5 mg by mouth daily. Hold if Systolic BP is less than 725 or Diastotic BP is less than 60, Until Discontinued, Historical Med    tiotropium (SPIRIVA) 18 MCG inhalation capsule Place 18 mcg into inhaler and inhale daily., Until Discontinued, Historical Med    docusate sodium (COLACE) 100 MG capsule Take 1 capsule (100 mg total) by mouth 2 (two) times daily., Starting 04/06/2015, Until Discontinued, Print      STOP taking these medications     clopidogrel (PLAVIX) 75 MG tablet      ipratropium (ATROVENT) 0.02 % nebulizer solution      OVER THE COUNTER MEDICATION      PRESCRIPTION MEDICATION      prochlorperazine (COMPAZINE) 10 MG tablet      aspirin 325 MG tablet      HYDROcodone-acetaminophen (NORCO) 5-325 MG per tablet      ondansetron (ZOFRAN ODT) 4 MG disintegrating tablet         ALLERGIES:   Allergies  Allergen Reactions  . Celecoxib Swelling  . Codeine Nausea And Vomiting  . Metronidazole Other (See Comments)    neuropathy  . Other     Other reaction(s): Unknown Uncoded Allergy. Allergen: fosomax  . Pregabalin Swelling    Hives and rash  . Propranolol Hcl Other (See Comments)    Unknown  . Rofecoxib Other (See Comments)    Unknown  . Shellfish Allergy     Other reaction(s): Unknown  . Iodine Rash    Only if ingested    BRIEF HPI:  See H&P, Labs, Consult and Test reports for all details in brief, patient was admitted for evaluation of shortness of breath and generalized weakness.  CONSULTATIONS:   None  PERTINENT RADIOLOGIC STUDIES: Dg Chest 2 View  06/10/2015   CLINICAL DATA:  Increased shortness of breath and dry cough for several days. History of COPD and lung cancer. Oxygen dependent. Initial encounter.  EXAM: CHEST  2 VIEW  COMPARISON:  CT 05/10/2015.  Radiographs  04/05/2015.  FINDINGS: The heart size and mediastinal contours are stable. There is aortic atherosclerosis. The lungs are hyperinflated with diffusely increased interstitial markings and scattered scarring as before. Asymmetric spiculated density in the left upper lobe appears unchanged, related to treated bronchogenic carcinoma. No new nodules identified radiographically. There is no confluent airspace opacity or pleural effusion. The bones appear unchanged.  IMPRESSION: Stable radiographic appearance of the chest with severe emphysema and residual left upper lobe nodule reportedly from treated lung cancer. No acute superimposed findings.   Electronically Signed   By: Richardean Sale M.D.   On: 06/10/2015 13:52     PERTINENT LAB RESULTS: CBC:  Recent Labs  06/11/15 0502 06/11/15 1050 06/12/15 0529  WBC 5.8  --  10.8*  HGB 11.4* 10.9* 10.7*  HCT 37.3 35.2* 34.8*  PLT 420*  --  448*   CMET CMP  Component Value Date/Time   NA 139 06/11/2015 0502   NA 142 12/01/2013 1508   K 4.9 06/11/2015 0502   CL 101 06/11/2015 0502   CO2 27 06/11/2015 0502   GLUCOSE 243* 06/11/2015 0502   GLUCOSE 155* 12/01/2013 1508   BUN 14 06/11/2015 0502   BUN 12 12/01/2013 1508   BUN 15.3 11/28/2013 1549   CREATININE 0.95 06/11/2015 0502   CREATININE 0.9 11/28/2013 1549   CALCIUM 9.3 06/11/2015 0502   PROT 8.1 02/23/2014 0320   PROT 6.1 12/01/2013 1508   ALBUMIN 3.4* 02/23/2014 0320   AST 28 02/23/2014 0320   ALT 8 02/23/2014 0320   ALKPHOS 60 02/23/2014 0320   BILITOT 0.4 02/23/2014 0320   GFRNONAA 54* 06/11/2015 0502   GFRAA >60 06/11/2015 0502    GFR Estimated Creatinine Clearance: 37.1 mL/min (by C-G formula based on Cr of 0.95). No results for input(s): LIPASE, AMYLASE in the last 72 hours. No results for input(s): CKTOTAL, CKMB, CKMBINDEX, TROPONINI in the last 72 hours. Invalid input(s): POCBNP No results for input(s): DDIMER in the last 72 hours.  Recent Labs  06/10/15 1530    HGBA1C 5.4   No results for input(s): CHOL, HDL, LDLCALC, TRIG, CHOLHDL, LDLDIRECT in the last 72 hours. No results for input(s): TSH, T4TOTAL, T3FREE, THYROIDAB in the last 72 hours.  Invalid input(s): FREET3 No results for input(s): VITAMINB12, FOLATE, FERRITIN, TIBC, IRON, RETICCTPCT in the last 72 hours. Coags:  Recent Labs  06/11/15 0520  INR 1.03   Microbiology: Recent Results (from the past 240 hour(s))  MRSA PCR Screening     Status: Abnormal   Collection Time: 06/11/15  8:04 AM  Result Value Ref Range Status   MRSA by PCR POSITIVE (A) NEGATIVE Final    Comment:        The GeneXpert MRSA Assay (FDA approved for NASAL specimens only), is one component of a comprehensive MRSA colonization surveillance program. It is not intended to diagnose MRSA infection nor to guide or monitor treatment for MRSA infections. RESULT CALLED TO, READ BACK BY AND VERIFIED WITH: DJeneen Rinks RN AT [phone] ON 06.13.16 BY SHUEA      BRIEF HOSPITAL COURSE:  Hypoglycemia : Suspect secondary to poor oral intake, and continued use of scheduled pre meal NovoLog and Lantus. A1C 5.4. CBGs now significantly elevated as on steroids for COPD exac-placed back on Lantus (but at 8 units) and SSI-wouldl allow some permissive hyper glycemia and avoid tight glycemic control this frail 81 year old patient. Once off steroids, may need to stop insulin treatment if hypoglycemia reoccurs.  COPD exacerbation:Significantly improved with IV steroids, and with nebulized bronchodilators. Stop Solu-Medrol, start tapering prednisone. Continue with bronchodilators on discharge.   Corpulmonale:no significant leg edema-appears stable-continue Lasix  Vaginal Bleeding:ongoing for the past 1 month prior to admit, will need endometrial biopsy-have arranged outpatient GYN follow up on 6/29. Will stop Plavix-as patient claims that bleeding is getting heavier-no hx of CAD or CVA-not sure why on plavix  Essential hypertension:  Continue with Lasix and Lisinopril  Diabetes: See above. Have decreased Lantus to 8 units, have discontinued scheduled NovoLog and have instead placed on SSI. A1c only at 5.4, once off steroids and if hypoglycemia reoccurs-may need to stop insulin permanently.Differed to PCP  Generalized anxiety disorder : continue with clonazepam.  Lung mass : has a known lung mass-reviewed prior notes-because of her severe COPD this was never biopsied- she has undergone radiation treatment for this. She will follow-up radiation oncology at her  next scheduled appointment. I see no indication for ordering any radiological studies at this time  Chronic hypoxic respiratory failure: continue home O2  Anxiety:controlled-continue prn Benzo's  Generalized weakness: PT evaluation completed, recommendations are for SNF  Non-severe (moderate) malnutrition in context of acute illness/injury: Continue supplements  TODAY-DAY OF DISCHARGE:  Subjective:   Jennifer Duran today has no headache,no chest abdominal pain,no new weakness tingling or numbness, feels much better  Objective:   Blood pressure 131/54, pulse 85, temperature 98 F (36.7 C), temperature source Oral, resp. rate 20, height '5\' 3"'$  (1.6 m), weight 54.6 kg (120 lb 5.9 oz), SpO2 100 %.  Intake/Output Summary (Last 24 hours) at 06/12/15 1457 Last data filed at 06/12/15 1057  Gross per 24 hour  Intake    280 ml  Output      0 ml  Net    280 ml   Filed Weights   06/10/15 1855  Weight: 54.6 kg (120 lb 5.9 oz)    Exam Awake Alert, Oriented *3, No new F.N deficits, Normal affect [place].AT,PERRAL Supple Neck,No JVD, No cervical lymphadenopathy appriciated.  Symmetrical Chest wall movement, Good air movement bilaterally-except at bilateral bases, CTAB RRR,No Gallops,Rubs or new Murmurs, No Parasternal Heave +ve B.Sounds, Abd Soft, Non tender, No organomegaly appriciated, No rebound -guarding or rigidity. No Cyanosis, Clubbing or edema, No new Rash or  bruise  DISCHARGE CONDITION: Stable  DISPOSITION: SNF  DISCHARGE INSTRUCTIONS:    Activity:  As tolerated with Full fall precautions use walker/cane & assistance as needed  Diet recommendation: Diabetic Diet Heart Healthy diet  Discharge Instructions    Call MD for:  extreme fatigue    Complete by:  As directed      Call MD for:  persistant dizziness or light-headedness    Complete by:  As directed      Call MD for:  redness, tenderness, or signs of infection (pain, swelling, redness, odor or green/yellow discharge around incision site)    Complete by:  As directed      Diet - low sodium heart healthy    Complete by:  As directed      Diet Carb Modified    Complete by:  As directed      Increase activity slowly    Complete by:  As directed            Follow-up Information    Follow up with Donnamae Jude, MD On 06/27/2015.   Specialty:  Obstetrics and Gynecology   Why:  appointment at 3 pm   Contact information:   [place] [address] [phone]       Schedule an appointment as soon as possible for a visit in 1 week to follow up.   Contact information:   Primary MD      Total Time spent on discharge equals 45 minutes.  SignedOren Binet 06/12/2015 2:57 PM

## 2015-06-25 ENCOUNTER — Other Ambulatory Visit: Payer: Self-pay

## 2015-06-27 ENCOUNTER — Ambulatory Visit (INDEPENDENT_AMBULATORY_CARE_PROVIDER_SITE_OTHER): Payer: Medicare HMO | Admitting: Family Medicine

## 2015-06-27 ENCOUNTER — Encounter: Payer: Self-pay | Admitting: Family Medicine

## 2015-06-27 ENCOUNTER — Other Ambulatory Visit (HOSPITAL_COMMUNITY)
Admission: RE | Admit: 2015-06-27 | Discharge: 2015-06-27 | Disposition: A | Discharge status: Home / Self Care | Payer: Medicare HMO | Source: Ambulatory Visit | Attending: Family Medicine | Admitting: Family Medicine

## 2015-06-27 VITALS — BP 79/54 | HR 97 | Temp 98.1°F | Wt 117.4 lb

## 2015-06-27 DIAGNOSIS — N950 Postmenopausal bleeding: Secondary | ICD-10-CM | POA: Diagnosis present

## 2015-06-27 NOTE — Assessment & Plan Note (Signed)
In patient who is s/p TAH. There is a vaginal mass, worrisome for some sort of neoplasm.  This was discussed at length with the patient, likelihood of cancer in the diagnosis.  We will call her with the pathology results once they come in.

## 2015-06-27 NOTE — Patient Instructions (Signed)
Postmenopausal Bleeding  Postmenopausal bleeding is any bleeding a woman has after she has entered into menopause. Menopause is the end of a woman's fertile years. After menopause, a woman no longer ovulates or has menstrual periods.   Postmenopausal bleeding can be caused by various things. Any type of postmenopausal bleeding, even if it appears to be a typical menstrual period, is concerning. This should be evaluated by your health care provider. Any treatment will depend on the cause of the bleeding.  HOME CARE INSTRUCTIONS  Monitor your condition for any changes. The following actions may help to alleviate any discomfort you are experiencing:  · Avoid the use of tampons and douches as directed by your health care provider.   · Change your pads frequently.  · Get regular pelvic exams and Pap tests.  · Keep all follow-up appointments for diagnostic tests as directed by your health care provider.  SEEK MEDICAL CARE IF:   · Your bleeding lasts more than 1 week.  · You have abdominal pain.  · You have bleeding with sexual intercourse.  SEEK IMMEDIATE MEDICAL CARE IF:   · You have a fever, chills, headache, dizziness, muscle aches, and bleeding.  · You have severe pain with bleeding.  · You are passing blood clots.  · You have bleeding and need more than 1 pad an hour.  · You feel faint.  MAKE SURE YOU:  · Understand these instructions.  · Will watch your condition.  · Will get help right away if you are not doing well or get worse.  Document Released: 03/25/2006 Document Revised: 10/05/2013 Document Reviewed: 07/14/2013  Exit[employer]® Patient Information ©2015 [employer]. This information is not intended to replace advice given to you by your health care provider. Make sure you discuss any questions you have with your health care provider.

## 2015-06-27 NOTE — Progress Notes (Signed)
    Subjective:    Patient ID: CODA FILLER is a 81 y.o. female presenting with No chief complaint on file.  CC-postmenopausal bleeding on 06/27/2015  HPI: Reports vaginal bleeding x 2 months. Heavier x 2 days then gets less, but has certainly been heavy at times. She denies pain or dysuria.. Reports hysterectomy abdominal in 1963. Left ovaries.  Unsure about abnormal pap smears.  Review of Systems  Constitutional: Negative for fever and chills.  Respiratory: Negative for shortness of breath.   Cardiovascular: Negative for chest pain.  Gastrointestinal: Negative for nausea, vomiting, abdominal pain, diarrhea, constipation and blood in stool.  Genitourinary: Positive for vaginal bleeding. Negative for dysuria, urgency, hematuria and vaginal pain.  Skin: Negative for rash.      Objective:    BP 79/54 mmHg  Pulse 97  Temp(Src) 98.1 F (36.7 C)  Wt 117 lb 6.4 oz (53.252 kg) Physical Exam  Constitutional: She is oriented to person, place, and time. She has a sickly appearance. No distress.  HENT:  Head: Normocephalic and atraumatic.  Eyes: No scleral icterus.  Neck: Neck supple.  Cardiovascular: Normal rate.   Pulmonary/Chest: Effort normal. No respiratory distress.  Oxygen requiring, cannot complete sentences in one breath  Abdominal: Soft.  Genitourinary:  The vagina is atrophic. After insertion of speculum, there is friable tissue noted that bleeds easily on exam. On digital exam there is a firm mass noted at top of the vagina.  Neurological: She is alert and oriented to person, place, and time.  Skin: Skin is warm and dry.  Psychiatric: She has a normal mood and affect.        Assessment & Plan:   Problem List Items Addressed This Visit      Unprioritized   Postmenopausal bleeding - Primary    In patient who is s/p TAH. There is a vaginal mass, worrisome for some sort of neoplasm.  This was discussed at length with the patient, likelihood of cancer in the  diagnosis.  We will call her with the pathology results once they come in.      Relevant Orders   Surgical pathology     Return in about 2 weeks (around 07/11/2015).   PRATT,TANYA S 06/27/2015 3:42 PM

## 2015-06-28 DIAGNOSIS — N950 Postmenopausal bleeding: Secondary | ICD-10-CM | POA: Diagnosis not present

## 2015-07-05 ENCOUNTER — Ambulatory Visit: Payer: Medicare HMO | Attending: Gynecologic Oncology | Admitting: Gynecologic Oncology

## 2015-07-05 ENCOUNTER — Encounter: Payer: Self-pay | Admitting: Gynecologic Oncology

## 2015-07-05 VITALS — BP 136/71 | HR 100 | Temp 98.2°F | Resp 22 | Ht 63.0 in | Wt 116.5 lb

## 2015-07-05 DIAGNOSIS — N9489 Other specified conditions associated with female genital organs and menstrual cycle: Secondary | ICD-10-CM | POA: Insufficient documentation

## 2015-07-05 DIAGNOSIS — C7800 Secondary malignant neoplasm of unspecified lung: Secondary | ICD-10-CM

## 2015-07-05 DIAGNOSIS — C52 Malignant neoplasm of vagina: Secondary | ICD-10-CM | POA: Insufficient documentation

## 2015-07-05 DIAGNOSIS — N898 Other specified noninflammatory disorders of vagina: Secondary | ICD-10-CM

## 2015-07-05 NOTE — Patient Instructions (Signed)
We will contact you with the results of your PET scan.  You will receive a phone call from the [facility] with an appointment for consultation with a Medical Oncologist.  We will also have your see Dr. Sondra Come in Radiation Oncology for palliative radiation to the vagina on July 13th at 10am at the [facility].  Please call for any questions or concerns.

## 2015-07-05 NOTE — Progress Notes (Signed)
Consult Note: Gyn-Onc  Consult was requested by Dr. Kennon Rounds for the evaluation of Jennifer Duran 81 y.o. 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 Vaginal Mass    Assessment/Plan:  Jennifer Duran  is a 81 y.o.  year old with a bleeding vaginal mass which is read as melanoma on preliminary pathology (final pathology pending) in the setting of pulmonary metastases and poor general health (advanced age, O2 dependent COPD, peripheral vascular disease, diabetes).  The patient's vaginal mass may be a metastatic focus based on the preliminary pathology. Recommend palliative radiation to the pelvis/vagina to control symptoms of bleeding. I discussed with Jennifer Duran and her son that surgery is not an option given the large size of this mass (it would require a total or anterior pelvic exenteration to resect) and because she has evidence of distant metastases (lung) on CT of the chest therefore surgery would not be curative. Additionally, she is not interested in surgery at her age, and her comorbidities would make a major surgery highly morbid and of no benefit.  We will obtain a PET/CT to evaluate for the extent of her disease.   I will have her seen by medical oncology to discuss options for systemic therapy after she has completed palliative radiation. I discussed with Jennifer Duran that cure for stage IV melanoma is of low probability. She expressed to me that she is not interested in cytotoxic chemotherapy. After discussing options with an Oncologist she may determine that palliative care is her preferred route.   HPI: Jennifer Duran is a very pleasant 81 year old woman with oxygen dependent COPD who is seen in consultation at the request of Dr. 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  The patient has been treated in 2014 for a presumed lung cancer. She was treated with radiation alone. Chemotherapy was not contemplated by the patient secondary to her  comorbidities. She was felt to have had a good response to this treatment. A surveillance CT scan of the lungs was performed on 05/10/2015 and this revealed stable left upper lobe spiculated nodule measuring 1.6 x 0.8 cm. However there were new bilateral pulmonary nodules seen which were suspicious for metastatic disease. The largest lesions were a 6 mm nodule within the right lower lobe, a 3 mm right middle lobe nodule, and a 4 mm left lung base nodule.  She reports having vaginal bleeding that has been intermittent since May 2016. She sought evaluation with Dr. Darron Doom who performed a pelvic examination and biopsy of a fleshy upper vaginal mass. The definitive written pathology report is not available at the time of this consultation, however the overall report from the pathology department reports that is suspicious for metastatic melanoma. It has been sent out to an outside pathology lab for confirmation.  Other symptoms at the patient reports include urinary incontinence. She denies pelvic pain. She denies shortness of breath other than her chronic existing COPD related shortness of breath. She denies a new cough.   Current Meds:  Outpatient Encounter Prescriptions as of 07/05/2015  Medication Sig  . albuterol (PROVENTIL HFA;VENTOLIN HFA) 108 (90 BASE) MCG/ACT inhaler Inhale 2 puffs into the lungs every 4 (four) hours as needed for wheezing or shortness of breath.  Marland Kitchen albuterol (PROVENTIL) (2.5 MG/3ML) 0.083% nebulizer solution Take 3 mLs (2.5 mg total) by nebulization every 2 (two) hours as needed for wheezing or shortness of breath.  . B-D ULTRAFINE III SHORT PEN 31G X 8 MM MISC   .  benzonatate (TESSALON) 100 MG capsule Take 1 capsule (100 mg total) by mouth 3 (three) times daily as needed for cough.  . citalopram (CELEXA) 20 MG tablet Take 20 mg by mouth daily.  . clonazePAM (KLONOPIN) 0.5 MG tablet Take 0.5-1 tablets (0.25-0.5 mg total) by mouth 2 (two) times daily. 0.'25mg'$  in the am 0.'5mg'$  in  the PM  . ferrous sulfate 325 (65 FE) MG tablet Take 325 mg by mouth daily.  . Fluticasone-Salmeterol (ADVAIR) 250-50 MCG/DOSE AEPB Inhale 1 puff into the lungs 2 (two) times daily.  . furosemide (LASIX) 40 MG tablet Take 40 mg by mouth daily.  Marland Kitchen guaiFENesin (MUCINEX) 600 MG 12 hr tablet Take 1 tablet (600 mg total) by mouth 2 (two) times daily.  . insulin aspart (NOVOLOG) 100 UNIT/ML injection Inject 5 Units into the skin 3 (three) times daily before meals.  . insulin glargine (LANTUS) 100 UNIT/ML injection Inject 0.08 mLs (8 Units total) into the skin at bedtime.  Marland Kitchen ipratropium-albuterol (DUONEB) 0.5-2.5 (3) MG/3ML SOLN Take 3 mLs by nebulization 3 (three) times daily.  Marland Kitchen lisinopril (PRINIVIL,ZESTRIL) 2.5 MG tablet Take 2.5 mg by mouth daily. Hold if Systolic BP is less than 595 or Diastotic BP is less than 60  . tiotropium (SPIRIVA) 18 MCG inhalation capsule Place 18 mcg into inhaler and inhale daily.  . traMADol (ULTRAM) 50 MG tablet Take 2 tablets (100 mg total) by mouth at bedtime as needed.  . cholecalciferol (VITAMIN D) 1000 UNITS tablet Take 1,000 Units by mouth daily.  Marland Kitchen docusate sodium (COLACE) 100 MG capsule Take 1 capsule (100 mg total) by mouth 2 (two) times daily. (Patient not taking: Reported on 07/05/2015)  . predniSONE (DELTASONE) 10 MG tablet Take 4 tablets (40 mg) daily for 2 days, then, Take 3 tablets (30 mg) daily for 2 days, then, Take 2 tablets (20 mg) daily for 2 days, then, Take 1 tablets (10 mg) daily for 1 days, then stop (Patient not taking: Reported on 07/05/2015)   No facility-administered encounter medications on file as of 07/05/2015.    Allergy:  Allergies  Allergen Reactions  . Celecoxib Swelling  . Codeine Nausea And Vomiting  . Metronidazole Other (See Comments)    neuropathy  . Other     Other reaction(s): Unknown Uncoded Allergy. Allergen: fosomax  . Pregabalin Swelling    Hives and rash  . Propranolol Hcl Other (See Comments)    Unknown  .  Rofecoxib Other (See Comments)    Unknown  . Shellfish Allergy     Other reaction(s): Unknown  . Iodine Rash    Only if ingested    Social Hx:   History   Social History  . Marital Status: Widowed    Spouse Name: N/A  . Number of Children: N/A  . Years of Education: N/A   Occupational History  . Not on file.   Social History Main Topics  . Smoking status: Former Smoker -- 2.00 packs/day for 53 years  . Smokeless tobacco: Never Used  . Alcohol Use: No  . Drug Use: No  . Sexual Activity: No   Other Topics Concern  . Not on file   Social History Narrative    Past Surgical Hx:  Past Surgical History  Procedure Laterality Date  . Total abdominal hysterectomy    . Cataracts    . Rotator cuff repair    . Mastectomy Right 1952  . Eye surgery    . Radiation implant, female Left Aug. 2014  Left Breast  3 Tx's  . Hip pinning,cannulated Left 04/06/2015    Procedure: CANNULATED HIP PINNING;  Surgeon: Renette Butters, MD;  Location: [place];  Service: Orthopedics;  Laterality: Left;    Past Medical Hx:  Past Medical History  Diagnosis Date  . Allergic rhinitis, cause unspecified   . Unspecified essential hypertension   . Cor pulmonale   . COPD (chronic obstructive pulmonary disease)   . Osteoporosis   . Hashimoto's disease   . Peripheral vascular disease   . Hyperlipidemia   . Depression   . Urinary frequency   . Other atopic dermatitis and related conditions   . Abnormal involuntary movements(781.0)   . Hyposmolality and/or hyponatremia   . Peripheral vascular disease, unspecified   . Lung mass   . Insomnia   . History of radiation therapy 08/08/2013, 08/10/2013, 08/16/2013    SBRT 54 Gy to left upper lobe pulmonary nodule  . Type II or unspecified type diabetes mellitus without mention of complication, not stated as uncontrolled     Past Gynecological History:  Remote history of hysterecomy (Benign indications) in 1963). SVD x 2  No LMP recorded. Patient has had  a hysterectomy.  Family Hx:  Family History  Problem Relation Age of Onset  . Cancer Mother     lung   . Lupus Father   . Heart disease Sister   . Cancer Maternal Aunt     bone cancer  . Cancer Maternal Grandmother     kidney  . Cancer Maternal Grandfather     colon    Review of Systems:  Constitutional  Feels well,   ENT Normal appearing ears and nares bilaterally Skin/Breast  No rash, sores, jaundice, itching, dryness Cardiovascular  No chest pain, shortness of breath, or edema  Pulmonary  No cough or wheeze.  Gastro Intestinal  No nausea, vomitting, or diarrhoea. No bright red blood per rectum, no abdominal pain, change in bowel movement, or constipation.  Genito Urinary  No frequency, urgency, dysuria,  Musculo Skeletal  No myalgia, arthralgia, joint swelling or pain  Neurologic  No weakness, numbness, change in gait,  Psychology  No depression, anxiety, insomnia.   Vitals:  Blood pressure 136/71, pulse 100, temperature 98.2 F (36.8 C), temperature source Oral, resp. rate 22, height '5\' 3"'$  (1.6 m), weight 116 lb 8 oz (52.844 kg), SpO2 94 %.  Physical Exam: WD in NAD Neck  Supple NROM, without any enlargements.  Lymph Node Survey No cervical supraclavicular or inguinal adenopathy Cardiovascular  Pulse normal rate, regularity and rhythm. S1 and S2 normal.  Lungs  Severely decreased breath sounds bilateraly, without wheezes/crackles/rhonchi.  Skin  No rash/lesions/breakdown  Psychiatry  Alert and oriented to person, place, and time  Abdomen  Normoactive bowel sounds, abdomen soft, non-tender and thin without evidence of hernia.  Back No CVA tenderness Genito Urinary  Vulva/vagina: Normal external female genitalia.  No lesions including no pigmented lesions. No discharge or bleeding.  Bladder/urethra:  No lesions or masses, well supported bladder  Vagina: large, 6cm mass filling upper anterior vaginal wall and involving trigone of bladder. Fleshy,  bleeds easily. Nonpigmented.  Cervix: surgically absent  Uterus: surgically absent  Adnexa: this vaginal mass fills the anterior pelvis and is minimally mobile.  Rectal  Good tone, no masses no cul de sac nodularity. Mass does not apparent involve rectum. Extremities  No bilateral cyanosis, clubbing or edema.   Donaciano Eva, MD'   07/05/2015, 11:26 AM

## 2015-07-09 NOTE — Progress Notes (Signed)
GYN Location of Tumor / Histology:metastatic melanoma   Adriannah D Pinkhasov presented with vaginal bleeding that has been intermittent since last winter.  Biopsies revealed:   06/27/15 Diagnosis Vagina, biopsy - MALIGNANT MELANOMA, SEE DESCRIPTION.  Past/Anticipated interventions by Gyn/Onc surgery, if any: Per Dr. Denman George - "discussed with [place] and her son that surgery is not an option given the large size of this mass (it would require a total or anterior pelvic exenteration to resect) and because she has evidence of distant metastases (lung) on CT of the chest therefore surgery would not be curative. Additionally, she is not interested in surgery at her age, and her comorbidities would make a major surgery highly morbid and of no benefit."  Past/Anticipated interventions by medical oncology, if any: no. Patient does not want chemotherapy.  Weight changes, if any: has lost 2 lbs since 07/05/15.  Reports a good appetite.  Bowel/Bladder complaints, if any: reports occasional burning with urination.  Denies bowel issues.  Nausea/Vomiting, if any: no  Pain issues, if any:  no  SAFETY ISSUES:  Prior radiation? 08/08/2013, 08/10/2013, 08/16/2013 SBRT 54 Gy to left upper lobe pulmonary nodule  Pacemaker/ICD? no  Possible current pregnancy? no  Is the patient on methotrexate? no  Current Complaints / other details:  PET scan scheduled for 07/13/15.  Patient reports having bright red vaginal bleeding that varies in amount from spotting to having to change pads hourly from day to day. Patient is here with her daughter in law.   BP 134/58 mmHg  Pulse 100  Temp(Src) 98.1 F (36.7 C) (Oral)  Resp 20  Ht '5\' 3"'$  (1.6 m)  Wt 114 lb 1.6 oz (51.755 kg)  BMI 20.22 kg/m2  SpO2 100%

## 2015-07-11 ENCOUNTER — Encounter: Payer: Self-pay | Admitting: Radiation Oncology

## 2015-07-11 ENCOUNTER — Ambulatory Visit
Admission: RE | Admit: 2015-07-11 | Discharge: 2015-07-11 | Disposition: A | Discharge status: Home / Self Care | Payer: Medicare HMO | Source: Ambulatory Visit | Attending: Radiation Oncology | Admitting: Radiation Oncology

## 2015-07-11 VITALS — BP 134/58 | HR 100 | Temp 98.1°F | Resp 20 | Ht 63.0 in | Wt 114.1 lb

## 2015-07-11 DIAGNOSIS — C52 Malignant neoplasm of vagina: Secondary | ICD-10-CM | POA: Diagnosis present

## 2015-07-11 DIAGNOSIS — L209 Atopic dermatitis, unspecified: Secondary | ICD-10-CM | POA: Diagnosis not present

## 2015-07-11 DIAGNOSIS — M1990 Unspecified osteoarthritis, unspecified site: Secondary | ICD-10-CM | POA: Diagnosis not present

## 2015-07-11 DIAGNOSIS — J309 Allergic rhinitis, unspecified: Secondary | ICD-10-CM | POA: Insufficient documentation

## 2015-07-11 DIAGNOSIS — R918 Other nonspecific abnormal finding of lung field: Secondary | ICD-10-CM | POA: Insufficient documentation

## 2015-07-11 DIAGNOSIS — F329 Major depressive disorder, single episode, unspecified: Secondary | ICD-10-CM | POA: Insufficient documentation

## 2015-07-11 DIAGNOSIS — I739 Peripheral vascular disease, unspecified: Secondary | ICD-10-CM | POA: Diagnosis not present

## 2015-07-11 DIAGNOSIS — E063 Autoimmune thyroiditis: Secondary | ICD-10-CM | POA: Insufficient documentation

## 2015-07-11 DIAGNOSIS — I10 Essential (primary) hypertension: Secondary | ICD-10-CM | POA: Diagnosis not present

## 2015-07-11 DIAGNOSIS — Z510 Encounter for antineoplastic radiation therapy: Secondary | ICD-10-CM | POA: Insufficient documentation

## 2015-07-11 DIAGNOSIS — J449 Chronic obstructive pulmonary disease, unspecified: Secondary | ICD-10-CM | POA: Diagnosis present

## 2015-07-11 DIAGNOSIS — E785 Hyperlipidemia, unspecified: Secondary | ICD-10-CM | POA: Insufficient documentation

## 2015-07-11 NOTE — Progress Notes (Signed)
Please see the Nurse Progress Note in the MD Initial Consult Encounter for this patient. 

## 2015-07-11 NOTE — Progress Notes (Signed)
Radiation Oncology         ([phone]) [phone] ________________________________  Initial Outpatient Consultation  Name: Jennifer Duran MRN: [id number]  Date: 07/11/2015  DOB: 12-May-1929  CC:No primary care provider on file.  Everitt Amber, MD   REFERRING PHYSICIAN: Everitt Amber, MD  DIAGNOSIS: The encounter diagnosis was Melanoma of vagina.  HISTORY OF PRESENT ILLNESS::Jennifer Duran is a 81 y.o. female who is recently diagnosed with metastatic melanoma. Patient present with a bleeding vaginal mass, final pathology confirms it to be melanoma. She has a medical history of   poor general health (advanced age, O2 dependent COPD, peripheral vascular disease, diabetes). Dr. Denman George recommended palliative radiation to the pelvis/vagina to control symptoms of bleeding. She is not a good candidate for surgery given the large size of the mass and evidence of possible distant metastases (lung). A PET/CT will be performed to evaluate the extent of her metastatic disease. Patient has been experiencing vaginal bleeding since last winter. Patient presents today for consultation of palliative radiation treatment to control bleeding.  Patient denies pain in pelvis area, dysuria, painful bowel movements, appetite changes, hemoptysis. She is on 2L of oxygen. She notes tiredness.   Patient was treated with radiation alone in 2014 for lung cancer. Recent CT scans of lung area show a stable left upper lobe nodule however there are new very small bilateral pulmonary nodules present suspicious for metastatic disease.     PREVIOUS RADIATION THERAPY: Yes, SBRT completed 08/16/13, left upper lobe 54 GY  PAST MEDICAL HISTORY:  has a past medical history of Allergic rhinitis, cause unspecified; Unspecified essential hypertension; Cor pulmonale; COPD (chronic obstructive pulmonary disease); Osteoporosis; Hashimoto's disease; Peripheral vascular disease; Hyperlipidemia; Depression; Urinary frequency; Other atopic dermatitis and  related conditions; Abnormal involuntary movements(781.0); Hyposmolality and/or hyponatremia; Peripheral vascular disease, unspecified; Lung mass; Insomnia; History of radiation therapy (08/08/2013, 08/10/2013, 08/16/2013); and Type II or unspecified type diabetes mellitus without mention of complication, not stated as uncontrolled.    PAST SURGICAL HISTORY: Past Surgical History  Procedure Laterality Date  . Total abdominal hysterectomy    . Cataracts    . Rotator cuff repair    . Mastectomy Right 1952  . Eye surgery    . Radiation implant, female Left Aug. 2014    Left Breast  3 Tx's  . Hip pinning,cannulated Left 04/06/2015    Procedure: CANNULATED HIP PINNING;  Surgeon: Renette Butters, MD;  Location: [place];  Service: Orthopedics;  Laterality: Left;    FAMILY HISTORY: family history includes Cancer in her maternal aunt, maternal grandfather, maternal grandmother, and mother; Heart disease in her sister; Lupus in her father.  SOCIAL HISTORY:  reports that she has quit smoking. She has never used smokeless tobacco. She reports that she does not drink alcohol or use illicit drugs.  ALLERGIES: Celecoxib; Codeine; Metronidazole; Other; Pregabalin; Propranolol hcl; Rofecoxib; Shellfish allergy; and Iodine  MEDICATIONS:  Current Outpatient Prescriptions  Medication Sig Dispense Refill  . albuterol (PROVENTIL HFA;VENTOLIN HFA) 108 (90 BASE) MCG/ACT inhaler Inhale 2 puffs into the lungs every 4 (four) hours as needed for wheezing or shortness of breath. 1 Inhaler prn  . albuterol (PROVENTIL) (2.5 MG/3ML) 0.083% nebulizer solution Take 3 mLs (2.5 mg total) by nebulization every 2 (two) hours as needed for wheezing or shortness of breath. 75 mL 12  . B-D ULTRAFINE III SHORT PEN 31G X 8 MM MISC     . benzonatate (TESSALON) 100 MG capsule Take 1 capsule (100 mg total) by mouth 3 (  three) times daily as needed for cough. 20 capsule 0  . cholecalciferol (VITAMIN D) 1000 UNITS tablet Take 1,000 Units by  mouth daily.    . citalopram (CELEXA) 20 MG tablet Take 20 mg by mouth daily.    . clonazePAM (KLONOPIN) 0.5 MG tablet Take 0.5-1 tablets (0.25-0.5 mg total) by mouth 2 (two) times daily. 0.'25mg'$  in the am 0.'5mg'$  in the PM 20 tablet 0  . ferrous sulfate 325 (65 FE) MG tablet Take 325 mg by mouth daily.    . Fluticasone-Salmeterol (ADVAIR) 250-50 MCG/DOSE AEPB Inhale 1 puff into the lungs 2 (two) times daily.    . furosemide (LASIX) 40 MG tablet Take 40 mg by mouth daily.    Marland Kitchen guaiFENesin (MUCINEX) 600 MG 12 hr tablet Take 1 tablet (600 mg total) by mouth 2 (two) times daily. 15 tablet 0  . insulin aspart (NOVOLOG) 100 UNIT/ML injection Inject 5 Units into the skin 3 (three) times daily before meals.    . insulin glargine (LANTUS) 100 UNIT/ML injection Inject 0.08 mLs (8 Units total) into the skin at bedtime. 10 mL 11  . ipratropium-albuterol (DUONEB) 0.5-2.5 (3) MG/3ML SOLN Take 3 mLs by nebulization 3 (three) times daily. 360 mL 0  . lisinopril (PRINIVIL,ZESTRIL) 2.5 MG tablet Take 2.5 mg by mouth daily. Hold if Systolic BP is less than 416 or Diastotic BP is less than 60    . tiotropium (SPIRIVA) 18 MCG inhalation capsule Place 18 mcg into inhaler and inhale daily.    . traMADol (ULTRAM) 50 MG tablet Take 2 tablets (100 mg total) by mouth at bedtime as needed. 15 tablet 0  . docusate sodium (COLACE) 100 MG capsule Take 1 capsule (100 mg total) by mouth 2 (two) times daily. (Patient not taking: Reported on 07/05/2015) 10 capsule 0  . predniSONE (DELTASONE) 10 MG tablet Take 4 tablets (40 mg) daily for 2 days, then, Take 3 tablets (30 mg) daily for 2 days, then, Take 2 tablets (20 mg) daily for 2 days, then, Take 1 tablets (10 mg) daily for 1 days, then stop (Patient not taking: Reported on 07/05/2015) 19 tablet 0   No current facility-administered medications for this encounter.    REVIEW OF SYSTEMS:  A 15 point review of systems is documented in the electronic medical record. This was obtained by  the nursing staff. However, I reviewed this with the patient to discuss relevant findings and make appropriate changes.  Pertinent items are noted in HPI.   PHYSICAL EXAM:  height is '5\' 3"'$  (1.6 m) and weight is 114 lb 1.6 oz (51.755 kg). Her oral temperature is 98.1 F (36.7 C). Her blood pressure is 134/58 and her pulse is 100. Her respiration is 20 and oxygen saturation is 100%.    WD in NAD, accompanied by her daughter-in-law on evaluation today Neck  Supple NROM, without any enlargements.  Lymph Node Survey No cervical supraclavicular or inguinal adenopathy Cardiovascular  Pulse normal rate, regularity and rhythm. S1 and S2 normal.  Lungs   decreased breath sounds bilateraly, without wheezes/crackles/rhonchi.  Skin  No rash/lesions/breakdown  Psychiatry  Alert and oriented to person, place, and time, mild confusion  Abdomen  Normoactive bowel sounds, abdomen soft, non-tender and thin without evidence of hernia.  Back No CVA tenderness Genito Urinary  Vulva/vagina: Normal external female genitalia.  No lesions including no pigmented lesions. No discharge or bleeding.  Vagina: large, 6cm mass filling upper anterior vaginal wall and involving trigone of bladder. Fixed to anterior  pelvis,  bleeds easily. Nonpigmented.  Cervix: surgically absent  Uterus: surgically absent  Extremities  No bilateral cyanosis, clubbing or edema.   ECOG = 1  1 - Symptomatic but completely ambulatory (Restricted in physically strenuous activity but ambulatory and able to carry out work of a light or sedentary nature. For example, light housework, office work)  LABORATORY DATA:  Lab Results  Component Value Date   WBC 10.8* 06/12/2015   HGB 10.7* 06/12/2015   HCT 34.8* 06/12/2015   MCV 90.4 06/12/2015   PLT 448* 06/12/2015   NEUTROABS 8.3* 06/10/2015   Lab Results  Component Value Date   NA 139 06/11/2015   K 4.9 06/11/2015   CL 101 06/11/2015   CO2 27 06/11/2015   GLUCOSE 243*  06/11/2015   CREATININE 0.95 06/11/2015   CALCIUM 9.3 06/11/2015      RADIOGRAPHY: PET scan July 15    IMPRESSION: Patient presents with vaginal melanoma. She is not a candidate for curative treatment given the size of the lesion. She is expericing vaginal bleeding and will benefit from palliative radiation therapy. I discussed options for this type of treatment vs. hospice. We discussed in detail that melanomas have a variable response to radiation therapy.   PLAN: Will discuss with son, who is healthcare power of attorney, about PET scan results next week on Tuesday. Patient and son will decide whether to proceed with palliative radiation or hospice at that time.     ------------------------------------------------  Blair Promise, PhD, MD    This document serves as a record of services personally performed by Gery Pray, MD. It was created on his behalf by Derek Mound, a trained medical scribe. The creation of this record is based on the scribe's personal observations and the provider's statements to them. This document has been checked and approved by the attending provider.

## 2015-07-13 ENCOUNTER — Encounter (HOSPITAL_COMMUNITY)
Admission: RE | Admit: 2015-07-13 | Discharge: 2015-07-13 | Disposition: A | Discharge status: Home / Self Care | Payer: Medicare HMO | Source: Ambulatory Visit | Attending: Gynecologic Oncology | Admitting: Gynecologic Oncology

## 2015-07-13 DIAGNOSIS — C52 Malignant neoplasm of vagina: Secondary | ICD-10-CM | POA: Diagnosis present

## 2015-07-13 LAB — GLUCOSE, CAPILLARY: Glucose-Capillary: 124 mg/dL — ABNORMAL HIGH (ref 65–99)

## 2015-07-13 MED ORDER — FLUDEOXYGLUCOSE F - 18 (FDG) INJECTION
5.5000 | Freq: Once | INTRAVENOUS | Status: AC | PRN
  Administered 2015-07-13: 5.5 via INTRAVENOUS

## 2015-07-18 ENCOUNTER — Telehealth: Payer: Self-pay | Admitting: Oncology

## 2015-07-18 NOTE — Telephone Encounter (Signed)
Called Jennifer Duran's son, Jennifer Duran and notified him that Dr. Sondra Come will be calling him later this evening to discuss the PET scan results.  Jennifer Duran said that he will be unavailable from 3-4:15 so early evening would be better for him.  Thanked him and verbalized agreement.

## 2015-07-19 ENCOUNTER — Telehealth: Payer: Self-pay | Admitting: Hematology and Oncology

## 2015-07-19 NOTE — Telephone Encounter (Signed)
new patient appt-Called [facility] [phone] s/w Janett Billow and gave np appt for 07/28 @ 10 w/Dr. Alvy Bimler

## 2015-07-25 ENCOUNTER — Ambulatory Visit
Admission: RE | Admit: 2015-07-25 | Discharge: 2015-07-25 | Disposition: A | Discharge status: Home / Self Care | Payer: Medicare HMO | Source: Ambulatory Visit | Attending: Radiation Oncology | Admitting: Radiation Oncology

## 2015-07-25 VITALS — BP 101/50 | HR 100 | Temp 97.8°F | Wt 121.6 lb

## 2015-07-25 DIAGNOSIS — C52 Malignant neoplasm of vagina: Secondary | ICD-10-CM

## 2015-07-25 NOTE — Progress Notes (Signed)
Radiation Oncology         [phone] ________________________________  Name: Jennifer Duran MRN: [id number]  Date: 07/25/2015  DOB: 1929-04-20  Reevaluation Note  CC: No primary care provider on file.  Jennifer Lever, Jennifer Duran  Diagnosis: Jennifer Duran is a 81 year old female presenting to clinic in regards to her melanoma of the vagina.   Interval Since Last Radiation:  23 months (Previous radiation for a lung lesion)  Narrative:  The patient returns today with her son and daughter. Her son has health care power of attorney. The patient and her family are deciding which treatment approach to go with whether that be palliative radiation therapy or proceed with hospice. Today I reviewed the patient's PET scan images in detail. The patient and her family have informed me that she will be moving to the [place] area closer to her family in the near future. I discussed consideration for palliative radiation therapy at one of the [facility] and would be glad to help set up a consultation concerning this issue.  She continues to have no pain in any site within the body. She has mild vaginal bleeding but overall seems to have slowed down. She does admit that the bleeding is worse in the evening hours when she lies down.                            Radiographic Findings: Nm Pet Image Initial (pi) Whole Body  07/13/2015   CLINICAL DATA:  Subsequent treatment strategy for metastatic melanoma.  EXAM: NUCLEAR MEDICINE PET WHOLE BODY  TECHNIQUE: 5.5 mCi F-18 FDG was injected intravenously. Full-ring PET imaging was performed from the vertex to the feet after the radiotracer. CT data was obtained and used for attenuation correction and anatomic localization.  FASTING BLOOD GLUCOSE:  Value:  124 mg/dl  COMPARISON:  CT chest 05/10/2015 and PET 06/20/2013.  FINDINGS: Head/Neck: Mild asymmetric metabolism in the left nasopharynx, without CT correlate. No hypermetabolic lymph nodes. CT images show no  acute findings.  Chest: Irregular nodular lesion in the subpleural and posterior aspect the left upper lobe measures approximately 1.5 cm (CT image 25) with an SUV max of 2.8. Mid and lower lung zone pulmonary nodules are new or enlarged from 05/10/2015 with an index right lower lobe nodule measuring 1.2 x 1.3 cm adjacent to the right hemidiaphragm (CT image 58, series 6), and an SUV max of 5.2. No hypermetabolic mediastinal, hilar or axillary lymph nodes. CT images show three-vessel coronary artery calcification. No pericardial or pleural effusion. Superior centrilobular emphysema with bullous changes in the upper lobes.  Abdomen/Pelvis: There are multiple new ill-defined nodules and masses in the liver. Index left hepatic lobe mass measures 3.5 cm (CT image 124) with an SUV max of 6.3. A nodule along the medial margin of the upper pole right kidney measures 11 mm (CT image 141), is new from 05/10/2015 and has an SUV max of 2.5. Mild asymmetry of the lateral right paraspinous musculature, adjacent to the L4 vertebral body (CT image 159) with an SUV max of 3.8. Right internal iliac lymph node measures 7 mm (CT image 189) with an SUV max of 5.38, new from 06/20/2013. A mass arising from the vaginal cuff measures 4.6 x 7.2 cm and has an SUV max of 17.2. No abnormal hypermetabolism in the adrenal glands, spleen or pancreas. No additional hypermetabolic lymph nodes. CT images show the gallbladder and adrenal glands to  be grossly unremarkable. Further characterization is difficult without post-contrast imaging and due to size. Visualized portions of the kidneys, spleen, pancreas, stomach and bowel are otherwise grossly unremarkable.  Skeleton: Foci of hypermetabolism are seen in the spine, ribs and left iliac wing. Index lesion in approximately the T3 vertebral body has an SUV max of 5.8  Extremities: No abnormal osseous hypermetabolism.  IMPRESSION: 1. Interval progression of metastatic disease involving the lungs,  liver, vaginal cuff, right internal iliac lymph node and bones. 2. Mildly hypermetabolic nodule along the medial margin of the upper pole right kidney, new from 05/10/2015 and indicative of metastatic disease. 3. Slight asymmetry of the lateral right paraspinous musculature, at the L4 level, with associated hypermetabolism. Metastatic disease is not excluded. 4. Mild hypermetabolism within an irregular nodular soft tissue lesion in the posterior aspect of the left upper lobe. Findings may be due to scarring. Low-grade adenocarcinoma cannot be excluded.   Electronically Signed   By: Lorin Picket M.D.   On: 07/13/2015 11:02    Impression: Stage IV metastatic melanoma presenting in the upper vaginal region. As above the patient will likely proceed with moving to the [facility] area and then consider palliative radiation therapy or proceed directly into hospice. I've asked the patient and her family to let us know how we can help with making this transfer of care as smooth as possible.   This document serves as a record of services personally performed by Gery Pray, Jennifer Duran. It was created on his behalf by Lenn Cal, a trained medical scribe. The creation of this record is based on the scribe's personal observations and the provider's statements to them. This document has been checked and approved by the attending provider.    -----------------------------------  Blair Promise, PhD, Jennifer Duran

## 2015-07-25 NOTE — Progress Notes (Signed)
Follow up re-consultation here to review PET results with patient, son and daughter-in-law.Denies pain.  07/13/15: CLINICAL DATA: Subsequent treatment strategy for metastatic melanoma.  EXAM: NUCLEAR MEDICINE PET WHOLE BODY  TECHNIQUE: 5.5 mCi F-18 FDG was injected intravenously. Full-ring PET imaging was performed from the vertex to the feet after the radiotracer. CT data was obtained and used for attenuation correction and anatomic localization.  FASTING BLOOD GLUCOSE: Value: 124 mg/dl  COMPARISON: CT chest 05/10/2015 and PET 06/20/2013.  FINDINGS: Head/Neck: Mild asymmetric metabolism in the left nasopharynx, without CT correlate. No hypermetabolic lymph nodes. CT images show no acute findings.  Chest: Irregular nodular lesion in the subpleural and posterior aspect the left upper lobe measures approximately 1.5 cm (CT image 25) with an SUV max of 2.8. Mid and lower lung zone pulmonary nodules are new or enlarged from 05/10/2015 with an index right lower lobe nodule measuring 1.2 x 1.3 cm adjacent to the right hemidiaphragm (CT image 58, series 6), and an SUV max of 5.2. No hypermetabolic mediastinal, hilar or axillary lymph nodes. CT images show three-vessel coronary artery calcification. No pericardial or pleural effusion. Superior centrilobular emphysema with bullous changes in the upper lobes.  Abdomen/Pelvis: There are multiple new ill-defined nodules and masses in the liver. Index left hepatic lobe mass measures 3.5 cm (CT image 124) with an SUV max of 6.3. A nodule along the medial margin of the upper pole right kidney measures 11 mm (CT image 141), is new from 05/10/2015 and has an SUV max of 2.5. Mild asymmetry of the lateral right paraspinous musculature, adjacent to the L4 vertebral body (CT image 159) with an SUV max of 3.8. Right internal iliac lymph node measures 7 mm (CT image 189) with an SUV max of 5.38, new from 06/20/2013. A mass arising from  the vaginal cuff measures 4.6 x 7.2 cm and has an SUV max of 17.2. No abnormal hypermetabolism in the adrenal glands, spleen or pancreas. No additional hypermetabolic lymph nodes. CT images show the gallbladder and adrenal glands to be grossly unremarkable. Further characterization is difficult without post-contrast imaging and due to size. Visualized portions of the kidneys, spleen, pancreas, stomach and bowel are otherwise grossly unremarkable.  Skeleton: Foci of hypermetabolism are seen in the spine, ribs and left iliac wing. Index lesion in approximately the T3 vertebral body has an SUV max of 5.8  Extremities: No abnormal osseous hypermetabolism.  IMPRESSION: 1. Interval progression of metastatic disease involving the lungs, liver, vaginal cuff, right internal iliac lymph node and bones. 2. Mildly hypermetabolic nodule along the medial margin of the upper pole right kidney, new from 05/10/2015 and indicative of metastatic disease. 3. Slight asymmetry of the lateral right paraspinous musculature, at the L4 level, with associated hypermetabolism. Metastatic disease is not excluded. 4. Mild hypermetabolism within an irregular nodular soft tissue lesion in the posterior aspect of the left upper lobe. Findings may be due to scarring. Low-grade adenocarcinoma cannot be excluded.

## 2015-07-26 ENCOUNTER — Telehealth: Payer: Self-pay | Admitting: Hematology and Oncology

## 2015-07-26 ENCOUNTER — Ambulatory Visit: Payer: Medicare HMO

## 2015-07-26 ENCOUNTER — Ambulatory Visit: Payer: Medicare HMO | Admitting: Hematology and Oncology

## 2015-07-26 NOTE — Telephone Encounter (Signed)
NEW PATIENT APPT-LEFT MESSAGE FOR JESSICA AND GAVE NP APPT FOR 08/12 @ 1:30 W/DR. Marenisco [place] [phone] JESSICA

## 2015-07-26 NOTE — Telephone Encounter (Signed)
S/w Jessica @ [facility] and gave np appt for 08/02 @ 1:30 w/Dr. Alvy Bimler.

## 2015-07-31 ENCOUNTER — Ambulatory Visit: Payer: Medicare HMO

## 2015-07-31 ENCOUNTER — Ambulatory Visit: Payer: Medicare HMO | Admitting: Hematology and Oncology

## 2015-08-06 ENCOUNTER — Telehealth: Payer: Self-pay | Admitting: *Deleted

## 2015-08-06 NOTE — Telephone Encounter (Signed)
Received call from patient's son, Richardson Landry, stating that his mother is going to be moving to [facility] to be closer to family and that Dr. Rosiland Oz with [facility] will be assuming her care. Son stating that Dr. Waldon Reining is requesting BRAF testing on the vaginal biopsy tissue. Contacted Dr. Amadeo Garnet office and spoke with Anderson Malta, RN and gave her the contact number for pathology so Dr. Waldon Reining can obtaining further testing if indeed needed. Anderson Malta agreeable to pass information along to Dr. Waldon Reining.

## 2015-08-08 ENCOUNTER — Other Ambulatory Visit (HOSPITAL_COMMUNITY)
Admission: RE | Admit: 2015-08-08 | Discharge: 2015-08-08 | Disposition: A | Discharge status: Home / Self Care | Payer: Medicare HMO | Source: Ambulatory Visit | Attending: Internal Medicine | Admitting: Internal Medicine

## 2015-08-08 DIAGNOSIS — C52 Malignant neoplasm of vagina: Secondary | ICD-10-CM | POA: Diagnosis present

## 2015-08-15 ENCOUNTER — Encounter (HOSPITAL_COMMUNITY): Payer: Self-pay

## 2015-08-16 ENCOUNTER — Encounter (HOSPITAL_COMMUNITY): Payer: Self-pay

## 2015-09-25 ENCOUNTER — Ambulatory Visit: Payer: Medicare HMO | Admitting: Internal Medicine

## 2015-09-29 DEATH — deceased

## 2016-02-19 IMAGING — CR DG PORTABLE PELVIS
1 series · 1 of 1 positions shown · non-contrast
Comparison: None.

CLINICAL DATA: Hip fracture

EXAM:
PORTABLE PELVIS 1-2 VIEWS

[AP]
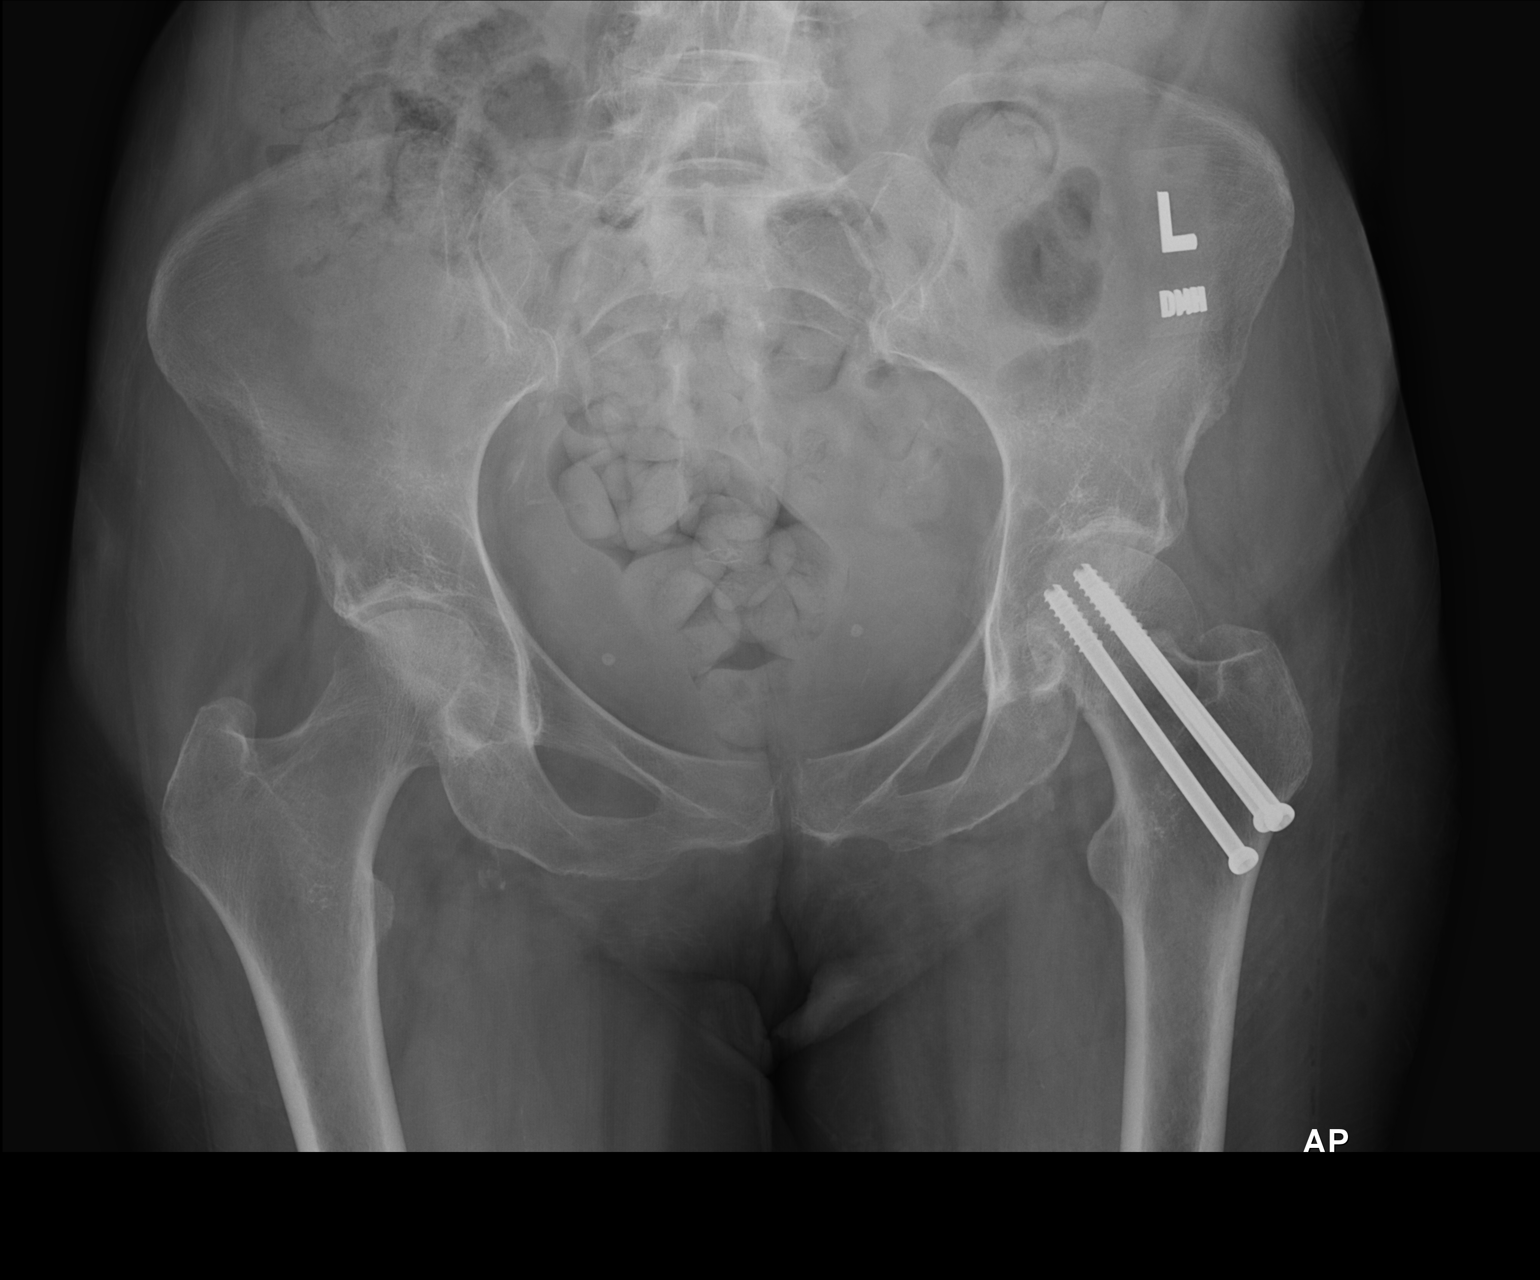

[1 of 1 positions shown; findings below may reference images not displayed]

FINDINGS: Three cannulated screws transfixing left femoral neck fracture. No
other acute fracture or dislocation. Mild osteoarthritis of
bilateral hips. No lytic or sclerotic osseous lesion.
IMPRESSION: Three cannulated screws transfixing a left femoral neck fracture.

## 2016-02-19 IMAGING — RF DG HIP (WITH PELVIS) OPERATIVE*L*
1 series · 2 of 2 positions shown · non-contrast
Comparison: None.

CLINICAL DATA: Left hip surgery.

EXAM:
OPERATIVE left HIP (WITH PELVIS IF PERFORMED) 2 VIEWS
TECHNIQUE: Fluoroscopic spot image(s) were submitted for interpretation
post-operatively.
FLUOROSCOPY TIME:  Fluoroscopy Time:  0 min, 24 seconds
Number of Acquired Images:  2

[Series 1: run · 2 of 2 slices shown]
[im 1/2]
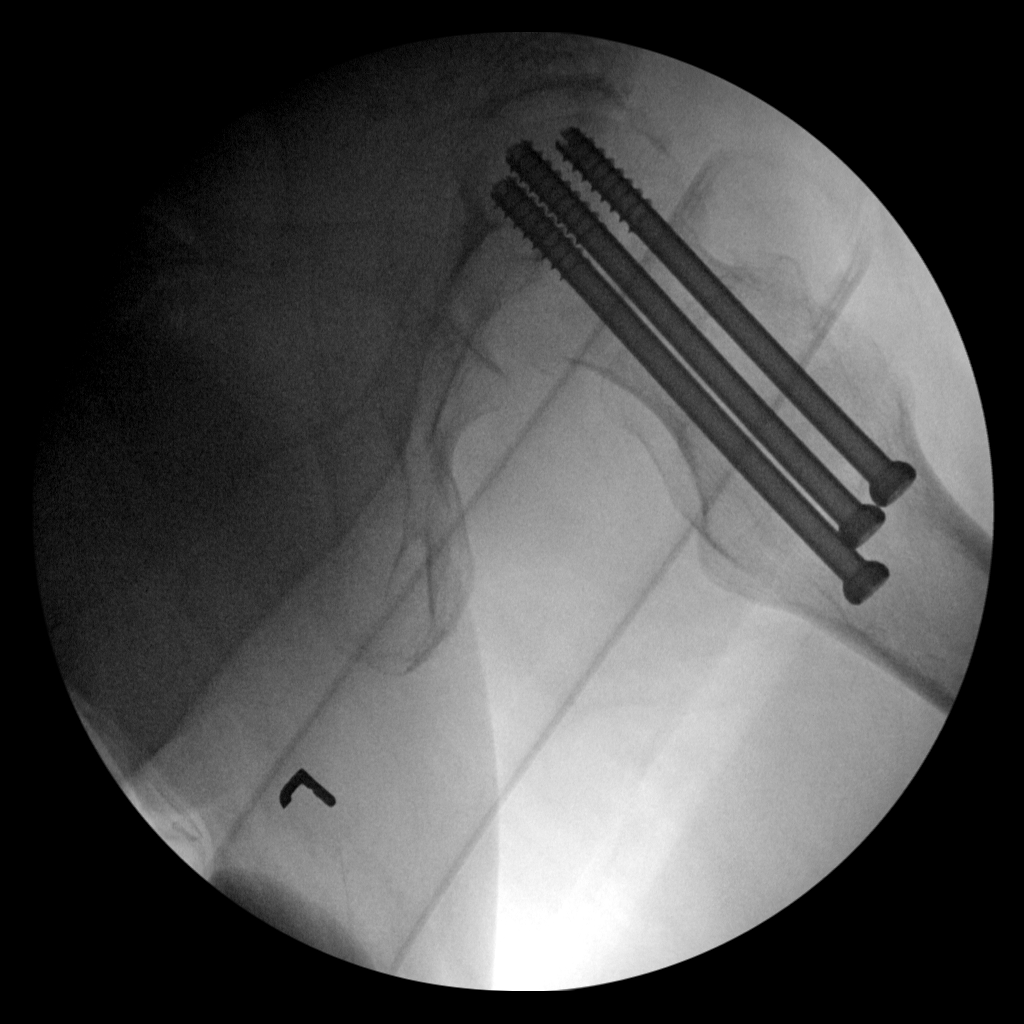
[im 2/2]
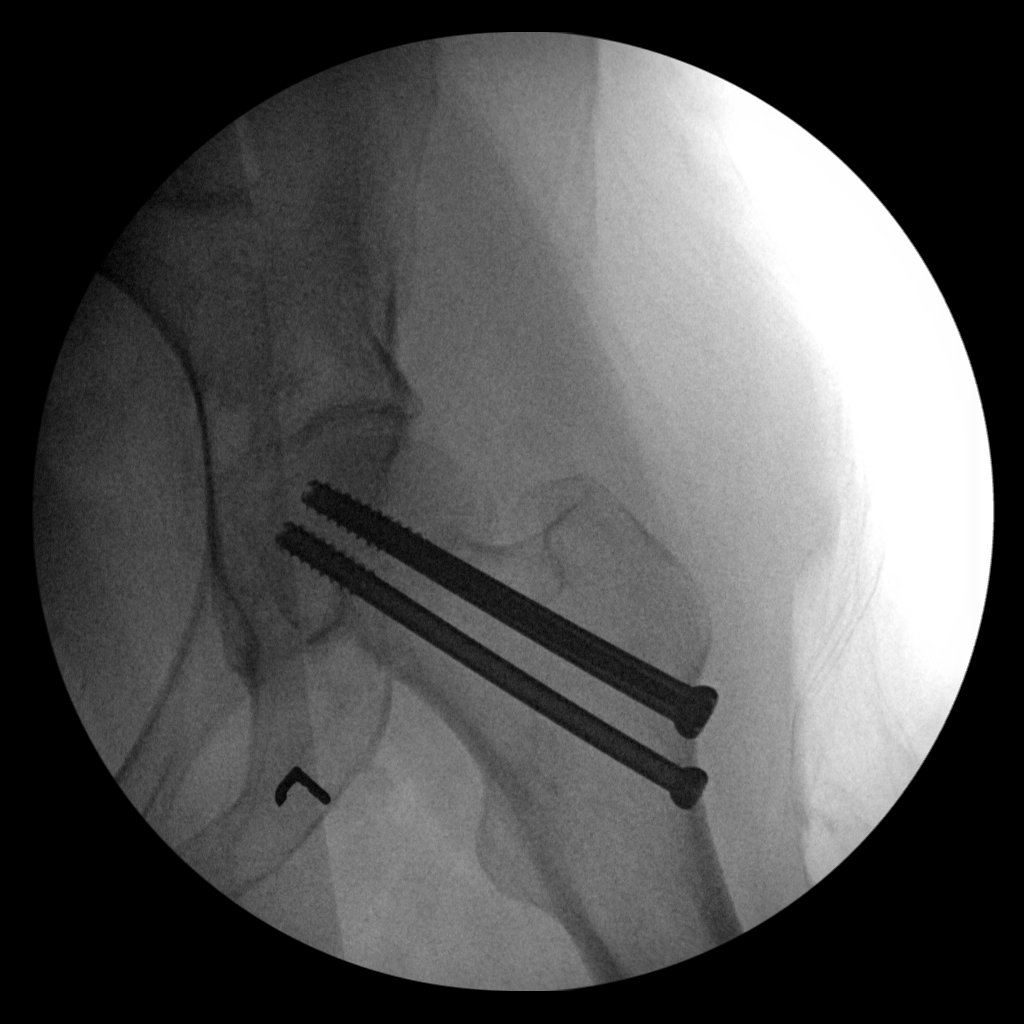

[2 of 2 positions shown; findings below may reference images not displayed]

FINDINGS: Patient status post open reduction internal fixation of left femoral
neck fracture. Good anatomic alignment. Hardware intact.
IMPRESSION: Open reduction internal fixation of left femoral neck fracture with
good anatomic alignment.

## 2016-05-27 IMAGING — CT NM PET TUM IMG INITIAL (PI) WHOLE BODY
1 of 8 series · 3 of 25 positions shown · non-contrast
Comparison: CT chest 05/10/2015 and PET 06/20/2013.

CLINICAL DATA: Subsequent treatment strategy for metastatic
melanoma.

EXAM:
NUCLEAR MEDICINE PET WHOLE BODY
TECHNIQUE: 5.5 mCi F-18 FDG was injected intravenously. Full-ring PET imaging
was performed from the vertex to the feet after the radiotracer. CT
data was obtained and used for attenuation correction and anatomic
localization.
FASTING BLOOD GLUCOSE:  Value:  124 mg/dl

[Series 2: ac ct wb 5.0 hd_fov · axial · 5.0mm · 1.52mm/px · z∈[+238,+1062]mm · 3 of 412 slices shown]
[im 103/412  soft-tissue]
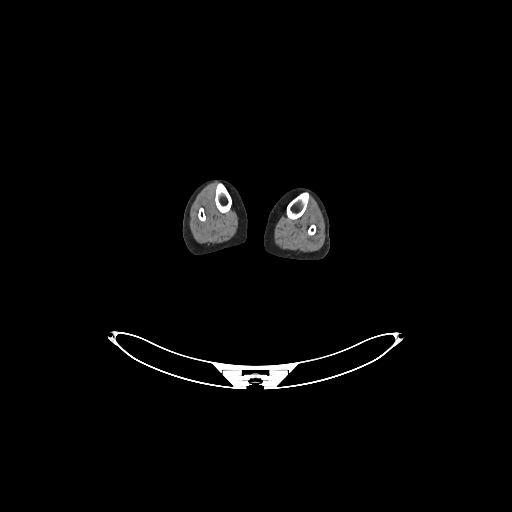
[im 206/412  soft-tissue]
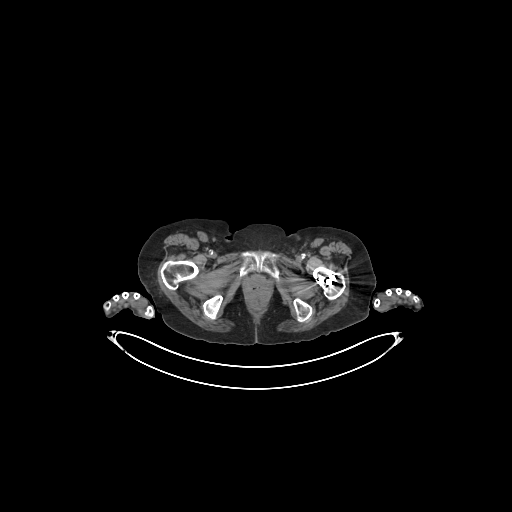
[im 309/412  soft-tissue]
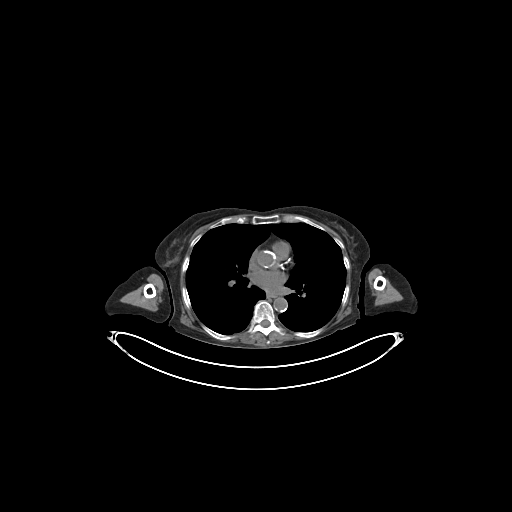

[3 of 25 positions shown; findings below may reference images not displayed]

FINDINGS: Head/Neck: Mild asymmetric metabolism in the left nasopharynx,
without CT correlate. No hypermetabolic lymph nodes. CT images show
no acute findings.

Chest: Irregular nodular lesion in the subpleural and posterior
aspect the left upper lobe measures approximately 1.5 cm (CT image
25) with an SUV max of 2.8. Mid and lower lung zone pulmonary
nodules are new or enlarged from 05/10/2015 with an index right
lower lobe nodule measuring 1.2 x 1.3 cm adjacent to the right
hemidiaphragm (CT image 58, series 6), and an SUV max of 5.2. No
hypermetabolic mediastinal, hilar or axillary lymph nodes. CT images
show three-vessel coronary artery calcification. No pericardial or
pleural effusion. Superior centrilobular emphysema with bullous
changes in the upper lobes.

Abdomen/Pelvis: There are multiple new ill-defined nodules and
masses in the liver. Index left hepatic lobe mass measures 3.5 cm
(CT image 124) with an SUV max of 6.3. A nodule along the medial
margin of the upper pole right kidney measures 11 mm (CT image 141),
is new from 05/10/2015 and has an SUV max of 2.5. Mild asymmetry of
the lateral right paraspinous musculature, adjacent to the L4
vertebral body (CT image 159) with an SUV max of 3.8. Right internal
iliac lymph node measures 7 mm (CT image 189) with an SUV max of
5.38, new from 06/20/2013. A mass arising from the vaginal cuff
measures 4.6 x 7.2 cm and has an SUV max of 17.2. No abnormal
hypermetabolism in the adrenal glands, spleen or pancreas. No
additional hypermetabolic lymph nodes. CT images show the
gallbladder and adrenal glands to be grossly unremarkable. Further
characterization is difficult without post-contrast imaging and due
to size. Visualized portions of the kidneys, spleen, pancreas,
stomach and bowel are otherwise grossly unremarkable.

Skeleton: Foci of hypermetabolism are seen in the spine, ribs and
left iliac wing. Index lesion in approximately the T3 vertebral body
has an SUV max of

Extremities: No abnormal osseous hypermetabolism.
IMPRESSION: 1. Interval progression of metastatic disease involving the lungs,
liver, vaginal cuff, right internal iliac lymph node and bones.
2. Mildly hypermetabolic nodule along the medial margin of the upper
pole right kidney, new from 05/10/2015 and indicative of metastatic
disease.
3. Slight asymmetry of the lateral right paraspinous musculature, at
the L4 level, with associated hypermetabolism. Metastatic disease is
not excluded.
4. Mild hypermetabolism within an irregular nodular soft tissue
lesion in the posterior aspect of the left upper lobe. Findings may
be due to scarring. Low-grade adenocarcinoma cannot be excluded.
